# Patient Record
Sex: Female | Born: 1990 | Race: Black or African American | Hispanic: No | Marital: Single | Smoking: Never smoker
Health system: Southern US, Community
[De-identification: ages and names within clinical notes are randomized; demographics above are authoritative.]

## PROBLEM LIST (undated history)

## (undated) ENCOUNTER — Inpatient Hospital Stay (HOSPITAL_COMMUNITY): Payer: Self-pay

## (undated) DIAGNOSIS — R519 Headache, unspecified: Secondary | ICD-10-CM

## (undated) HISTORY — PX: NO PAST SURGERIES: SHX2092

---
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
## 2010-04-15 ENCOUNTER — Inpatient Hospital Stay (INDEPENDENT_AMBULATORY_CARE_PROVIDER_SITE_OTHER)
Admission: RE | Admit: 2010-04-15 | Discharge: 2010-04-15 | Disposition: A | Discharge status: Home / Self Care | Payer: PRIVATE HEALTH INSURANCE | Source: Ambulatory Visit | Attending: Family Medicine | Admitting: Family Medicine

## 2010-04-15 DIAGNOSIS — Z331 Pregnant state, incidental: Secondary | ICD-10-CM

## 2010-04-15 LAB — POCT URINALYSIS DIP (DEVICE)
Hgb urine dipstick: NEGATIVE
Ketones, ur: NEGATIVE mg/dL
Nitrite: NEGATIVE
Specific Gravity, Urine: 1.02 (ref 1.005–1.030)
Urobilinogen, UA: 0.2 mg/dL (ref 0.0–1.0)

## 2010-06-07 ENCOUNTER — Inpatient Hospital Stay (HOSPITAL_COMMUNITY): Payer: PRIVATE HEALTH INSURANCE

## 2010-06-07 ENCOUNTER — Inpatient Hospital Stay (HOSPITAL_COMMUNITY)
Admission: AD | Admit: 2010-06-07 | Discharge: 2010-06-08 | Disposition: A | Discharge status: Home / Self Care | Payer: PRIVATE HEALTH INSURANCE | Source: Ambulatory Visit | Attending: Obstetrics and Gynecology | Admitting: Obstetrics and Gynecology

## 2010-06-07 DIAGNOSIS — O039 Complete or unspecified spontaneous abortion without complication: Secondary | ICD-10-CM | POA: Insufficient documentation

## 2010-06-07 LAB — CBC
MCH: 27.9 pg (ref 26.0–34.0)
Platelets: 265 10*3/uL (ref 150–400)
RBC: 4.56 MIL/uL (ref 3.87–5.11)
RDW: 13.7 % (ref 11.5–15.5)

## 2010-06-07 LAB — HCG, QUANTITATIVE, PREGNANCY: hCG, Beta Chain, Quant, S: 785 m[IU]/mL — ABNORMAL HIGH (ref <5)

## 2010-06-07 IMAGING — US US OB TRANSVAGINAL
1 series · 14 of 28 positions shown · non-contrast
Comparison: None.

CLINICAL DATA: 20-year-old female with heavy vaginal bleeding.
Gestational age by LMP 12 weeks and 6 days.  Quantitative beta HCG
pending.

OBSTETRIC <14 WK ULTRASOUND
TECHNIQUE: Transabdominal ultrasound was performed for evaluation
of the gestation as well as the maternal uterus and adnexal
regions.

[Series 1: us ob comp less 14 wks · 14 of 41 slices shown]
[im 2/41]
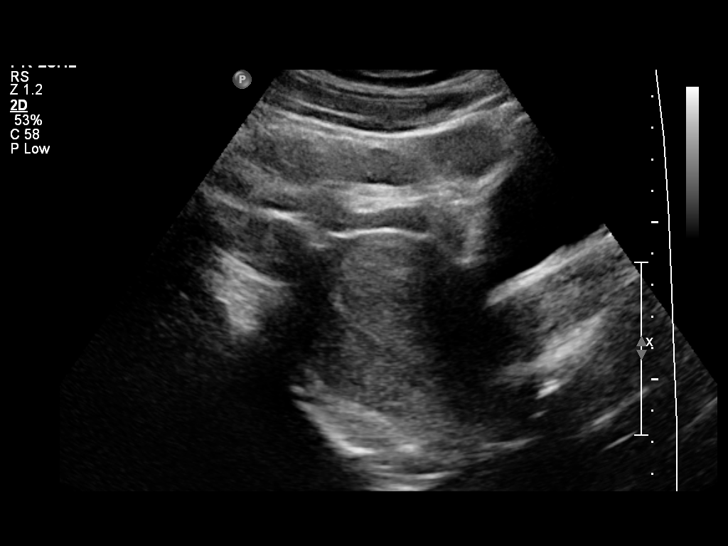
[im 5/41]
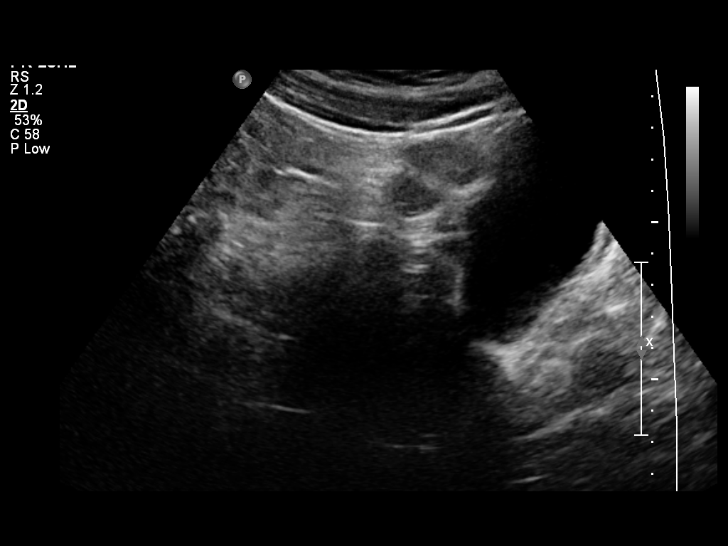
[im 8/41]
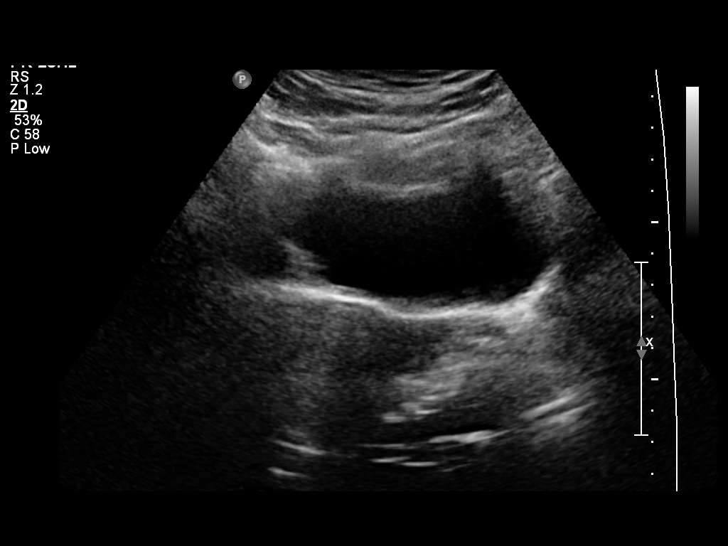
[im 11/41]
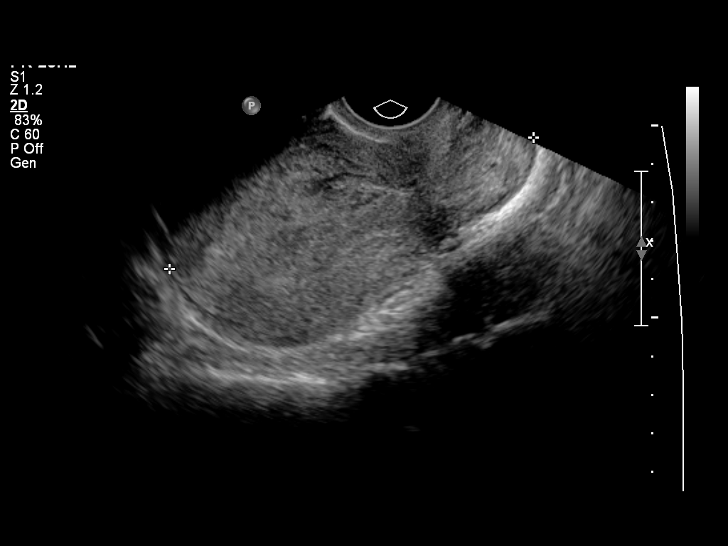
[im 14/41]
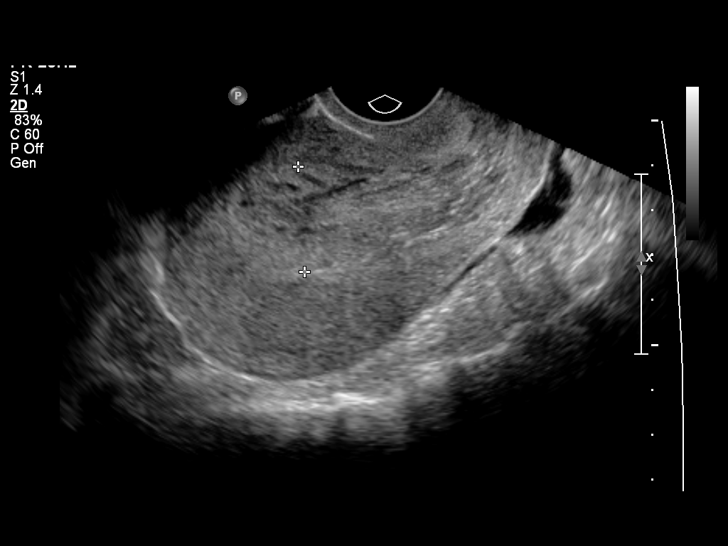
[im 17/41]
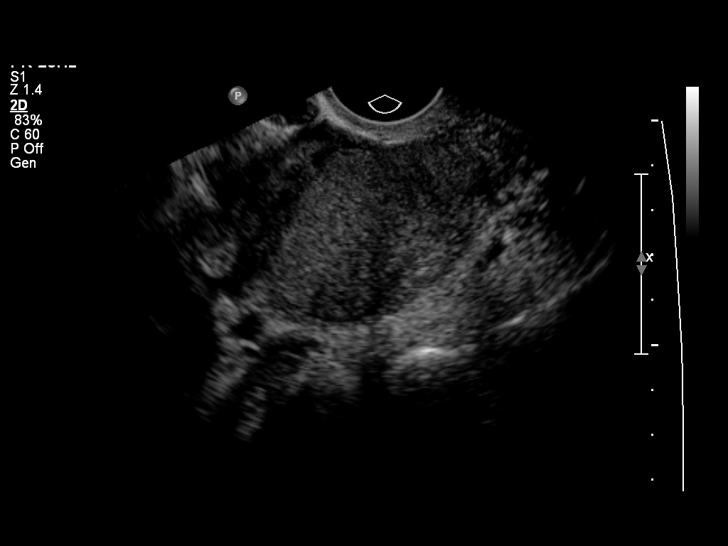
[im 20/41]
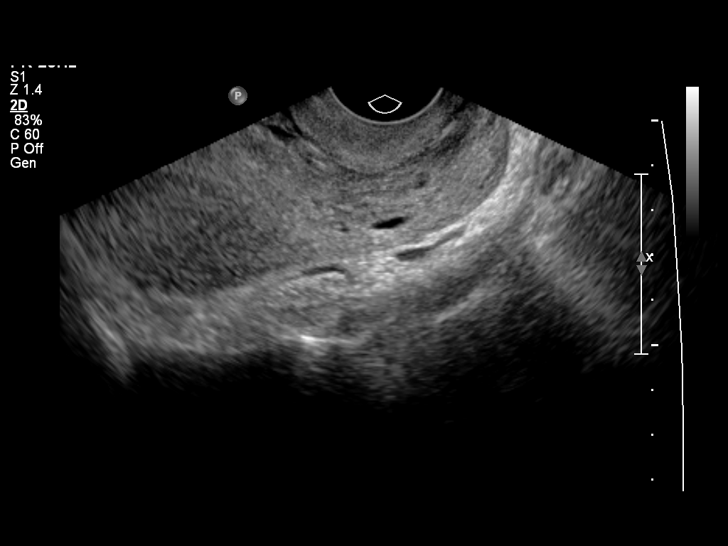
[im 23/41]
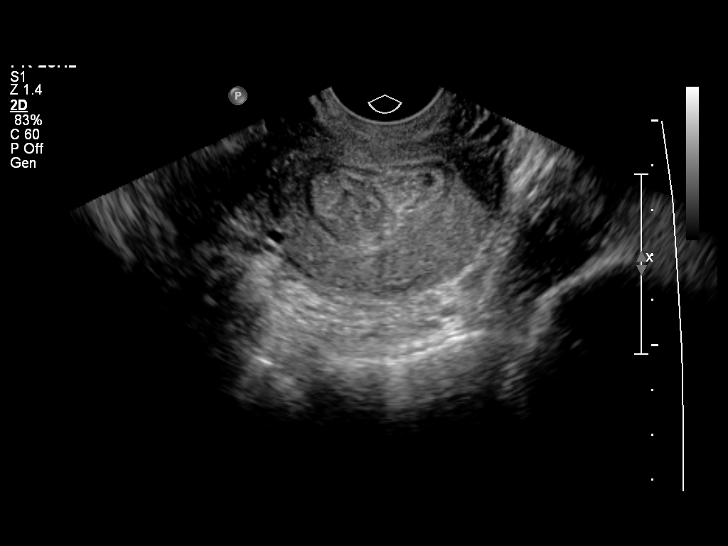
[im 26/41]
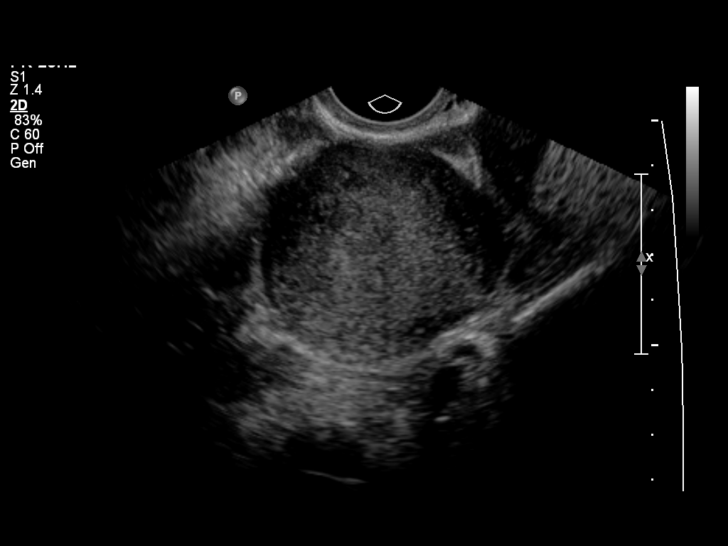
[im 29/41]
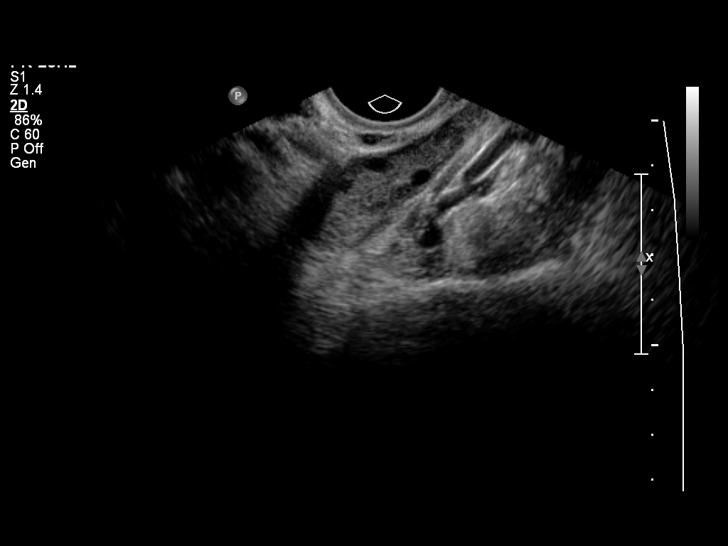
[im 32/41]
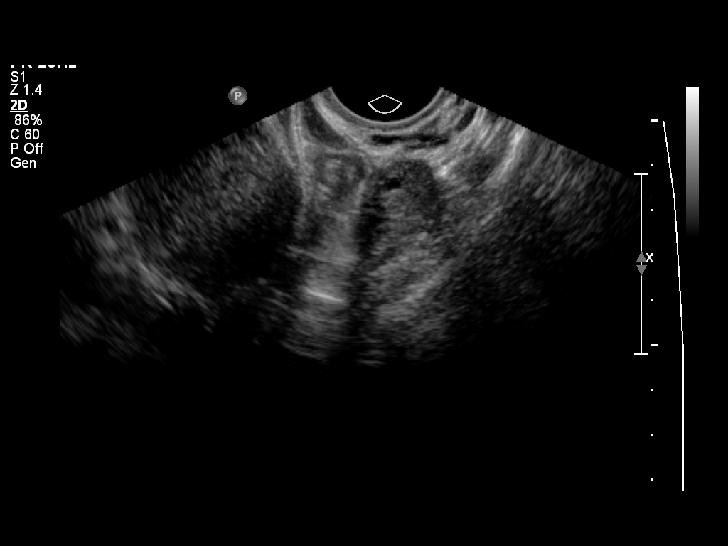
[im 35/41]
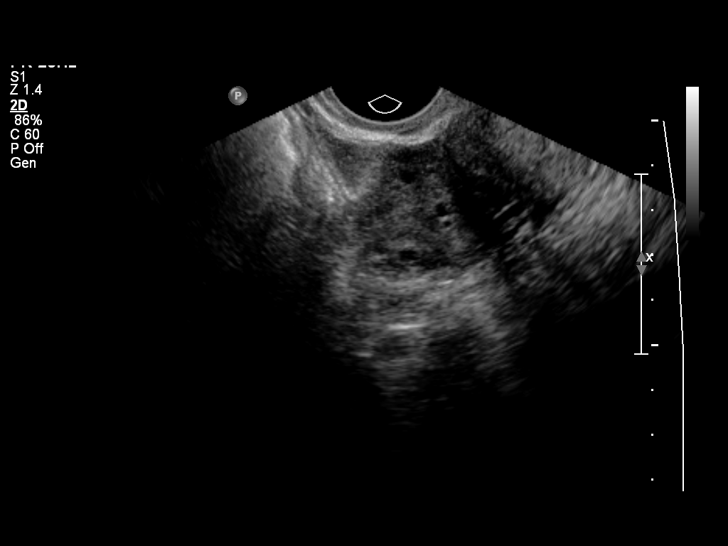
[im 38/41]
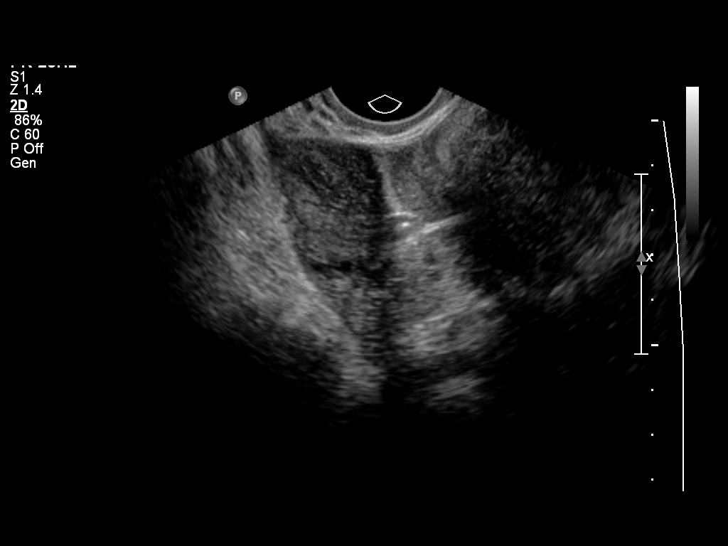
[im 41/41]
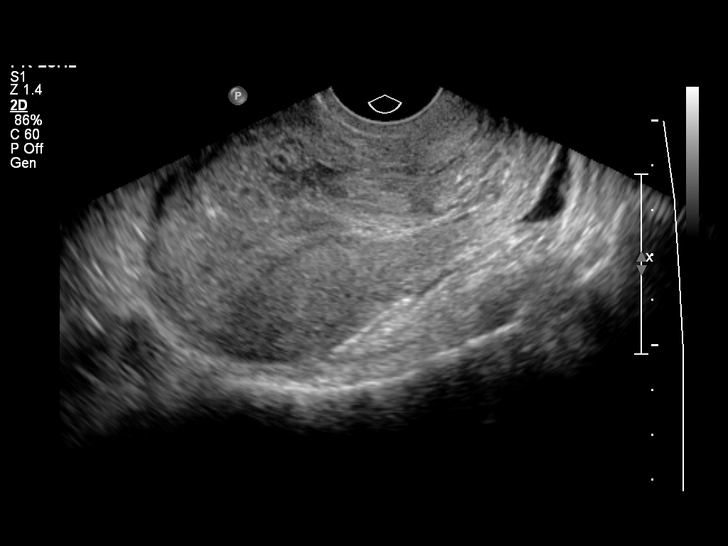

[14 of 28 positions shown; findings below may reference images not displayed]

Intrauterine gestational sac: None.

Maternal uterus/Adnexae:
Heterogeneous endometrial cavity, mostly hyperechogenic.
Endometrium measures up to 24 mm in thickness.  No pelvic free
fluid.
Both ovaries appear normal with small follicles.  The left measures
4.1 x 1.6 x 1.8 cm and the right measures 3.8 x 2.8 by 2.3 cm.
IMPRESSION: Heterogeneous endometrial cavity and no intrauterine gestational
sac.  Normal appearance of the adnexa and no pelvic free fluid.
Constellation of clinical and imaging findings favors spontaneous
abortion.

## 2010-06-08 ENCOUNTER — Ambulatory Visit (HOSPITAL_COMMUNITY)
Admission: EM | Admit: 2010-06-08 | Payer: PRIVATE HEALTH INSURANCE | Source: Ambulatory Visit | Admitting: Obstetrics and Gynecology

## 2010-07-11 NOTE — H&P (Signed)
  NAMEKENNEDY, Lisa Brown                  ACCOUNT NO.:  [id number]  MEDICAL RECORD NO.:  [id number]           PATIENT TYPE:  O  LOCATION:  [facility]                         FACILITY[facility]  [facility]  PHYSICIAN:  Anees Vanecek A. Andray Assefa, M.D. DATE OF BIRTH:  [birth year]  DATE OF ADMISSION:  06/07/2010 DATE OF DISCHARGE:  06/08/2010                             HISTORY & PHYSICAL   CHIEF COMPLAINT:  Missed AB at 8 weeks.  HISTORY OF PRESENT ILLNESS:  The patient is a 20 year old gravida 2, para 0 who presented on Jun 05, 2010 complaining of spotting and some cramping.  She had an ultrasound which showed a irregular shaped gestational sac, subchorionic hemorrhage and fetus without cardiac motion.  Both ovaries were normal.  The patient was given the options of observation versus Cytotec versus D and C, and she has decided to move with D and C.  PAST OBSTETRICAL HISTORY:  Significant for an elective abortion in 2005 in a prior pregnancy.  GYNECOLOGICAL HISTORY:  Significant for menarche at age 23 occurring every 28 days, lasting for 4-6 days.  No history of abnormal Pap or sexually transmitted diseases.  PAST MEDICAL HISTORY:  Significant for asthma.  FAMILY HISTORY:  Significant for varicosities and father with diabetes.  SOCIAL HISTORY:  The patient denies any tobacco use.  She had occasional alcohol use.  MEDICATIONS:  Prenatal vitamins.  ALLERGIES:  The patient is not allergic to any medicines.  PHYSICAL EXAMINATION:  VITAL SIGNS:  She weighs 240 pounds.  Blood pressure is 118/80. GENERAL:  She is in no apparent distress. HEART:  Regular rate and rhythm. LUNGS:  Clear to auscultation bilaterally. ABDOMEN:  Soft and nontender. GENITOURINARY:  Vaginal exam was done by the midwife.  There is some brownish discharge.  The cervix was closed.  Uterus about 6-week size.  ASSESSMENT:  Missed abortion at 8 weeks.  The patient desires a dilation and evacuation.  She understands the risks but not limited to  bleeding, infection, damage to internal organs such as bowel, bladder, and major blood vessels.  Her blood type is O+.     Lexxie Winberg A. Normand Sloop, M.D.     NAD/MEDQ  D:  06/07/2010  T:  06/08/2010  Job:  [id number]  Electronically Signed by Jaymes Graff M.D. on 07/11/2010 10:35:40 AM

## 2010-08-12 ENCOUNTER — Encounter (HOSPITAL_COMMUNITY): Payer: Self-pay

## 2010-08-12 ENCOUNTER — Inpatient Hospital Stay (HOSPITAL_COMMUNITY)
Admission: AD | Admit: 2010-08-12 | Discharge: 2010-08-12 | Disposition: A | Discharge status: Home / Self Care | Payer: PRIVATE HEALTH INSURANCE | Source: Ambulatory Visit | Attending: Obstetrics and Gynecology | Admitting: Obstetrics and Gynecology

## 2010-08-12 DIAGNOSIS — N949 Unspecified condition associated with female genital organs and menstrual cycle: Secondary | ICD-10-CM | POA: Insufficient documentation

## 2010-08-12 DIAGNOSIS — N920 Excessive and frequent menstruation with regular cycle: Secondary | ICD-10-CM | POA: Insufficient documentation

## 2010-08-12 LAB — URINALYSIS, ROUTINE W REFLEX MICROSCOPIC
Protein, ur: NEGATIVE mg/dL
Urobilinogen, UA: 0.2 mg/dL (ref 0.0–1.0)

## 2010-08-12 LAB — CBC
MCH: 28.8 pg (ref 26.0–34.0)
MCHC: 34.4 g/dL (ref 30.0–36.0)
MCV: 83.5 fL (ref 78.0–100.0)
Platelets: 257 10*3/uL (ref 150–400)
RBC: 4.73 MIL/uL (ref 3.87–5.11)
RDW: 13.4 % (ref 11.5–15.5)

## 2010-08-12 LAB — URINE MICROSCOPIC-ADD ON

## 2010-08-12 LAB — POCT PREGNANCY, URINE: Preg Test, Ur: NEGATIVE

## 2010-08-12 NOTE — Progress Notes (Signed)
Patient reports had miscarriage in April, has had unprotected intercourse since then, not using any form of birth control.

## 2010-08-12 NOTE — Progress Notes (Signed)
LMP 08/10/10 regular period, today had heavy vaginal bleeding went through 3 pads in an hour, bleeding has decreased some, having lower abdominal cramping

## 2010-08-12 NOTE — ED Provider Notes (Signed)
History    20 y/o AA female presents to MAU w CC heavy and crampy menses. Started yesterday and flow became heavy today, states soaked through a pad an hour x 3 hours, now less with less cramping. Currently on no contraceptive method and does not desire pregnancy @ this time. Had SAB with unintended pregnancy approx 3 mos ago. Has had reg menstrual bleeding since. Previous use of OCPs without problems but had difficulty with remembering to take pill every day and had BTB. Denies VTE (fam. Or personal) hx. Non-smoker. Denies migraine hx.  Chief Complaint  Patient presents with  . Vaginal Bleeding  . Abdominal Pain   Vaginal Bleeding The patient's primary symptoms include pelvic pain (menstrual cramping) and vaginal bleeding. This is a new problem. The current episode started yesterday. The problem occurs constantly. The problem has been rapidly improving. The pain is mild. The problem affects both sides. She is not pregnant. Pertinent negatives include no abdominal pain, anorexia, back pain, chills, constipation, diarrhea, dysuria, fever, flank pain, frequency, headaches, hematuria, joint swelling, nausea, painful intercourse, rash, sore throat or urgency. The symptoms are aggravated by activity. She has tried nothing for the symptoms. She is sexually active. No, her partner does not have an STD. She uses nothing for contraception. Her menstrual history has been regular. Her past medical history is significant for miscarriage and vaginosis. There is no history of an abdominal surgery, a Cesarean section, an ectopic pregnancy, endometriosis, a gynecological surgery, herpes simplex, menorrhagia, metrorrhagia, ovarian cysts, perineal abscess, PID, an STD or a terminated pregnancy.  Abdominal Pain Pertinent negatives include no anorexia, constipation, diarrhea, dysuria, fever, frequency, headaches, hematuria or nausea. There is no history of abdominal surgery.    Past Medical History  Diagnosis Date  . No  pertinent past medical history     Past Surgical History  Procedure Date  . No past surgeries     No family history on file.  History  Substance Use Topics  . Smoking status: Not on file  . Smokeless tobacco: Never Used  . Alcohol Use:     OB History    Grav Para Term Preterm Abortions TAB SAB Ect Mult Living   2    2  2          Review of Systems  Constitutional: Negative.  Negative for fever and chills.  HENT: Negative.  Negative for sore throat.   Eyes: Negative.   Respiratory: Negative.   Cardiovascular: Negative.   Gastrointestinal: Negative.  Negative for nausea, abdominal pain, diarrhea, constipation and anorexia.  Genitourinary: Positive for vaginal bleeding, menstrual problem and pelvic pain (menstrual cramping). Negative for dysuria, urgency, frequency, hematuria, flank pain, enuresis, difficulty urinating, genital sores, dyspareunia and menorrhagia.  Musculoskeletal: Negative.  Negative for back pain.  Skin: Negative for rash.  Neurological: Negative.  Negative for headaches.  Hematological: Negative.   Psychiatric/Behavioral: Negative.     Physical Exam  BP 112/67  Pulse 75  Temp(Src) 98.8 F (37.1 C) (Oral)  Resp 16  Ht 5\' 10"  (1.778 m)  Wt 108.863 kg (240 lb)  BMI 34.44 kg/m2  LMP 08/10/2010  Physical Exam  Constitutional: She is oriented to person, place, and time. She appears well-developed and well-nourished.  HENT:  Head: Normocephalic.  Cardiovascular: Normal rate.   Pulmonary/Chest: Effort normal.  Abdominal: Soft.  Genitourinary: Uterus normal. Vaginal discharge (mod flow menstrual blood) found.  Musculoskeletal: Normal range of motion.  Neurological: She is alert and oriented to person, place, and time. She  has normal reflexes.  Skin: Skin is warm and dry. No rash noted. No erythema. No pallor.  Psychiatric: She has a normal mood and affect. Her behavior is normal. Judgment and thought content normal.   Spec. Exam: mod menstrual blood  present, os closed without lesions. Pt states last Pap Jan 2012 and normal  Lab Results  Component Value Date   WBC 5.8 08/12/2010   HGB 13.6 08/12/2010   HCT 39.5 08/12/2010   MCV 83.5 08/12/2010   PLT 257 08/12/2010   Beta HCG Quant: neg ED Course  Procedures None  A: Menorrhagia P: 1. Ibuprofen 800 mg q 8 hours first dose with onset of Menstrual cramping/bleeding x 3 days 2. Nuvaring, insert tomorrow 3. ACHES precautions  4. F/U 3 mos for BP/Contraceptive method check in office

## 2010-09-01 ENCOUNTER — Inpatient Hospital Stay (INDEPENDENT_AMBULATORY_CARE_PROVIDER_SITE_OTHER)
Admission: RE | Admit: 2010-09-01 | Discharge: 2010-09-01 | Disposition: A | Discharge status: Home / Self Care | Payer: PRIVATE HEALTH INSURANCE | Source: Ambulatory Visit | Attending: Family Medicine | Admitting: Family Medicine

## 2010-09-01 DIAGNOSIS — N12 Tubulo-interstitial nephritis, not specified as acute or chronic: Secondary | ICD-10-CM

## 2010-09-01 DIAGNOSIS — K5900 Constipation, unspecified: Secondary | ICD-10-CM

## 2010-09-01 LAB — POCT URINALYSIS DIP (DEVICE)
Bilirubin Urine: NEGATIVE
Glucose, UA: NEGATIVE mg/dL
Ketones, ur: 15 mg/dL — AB
Specific Gravity, Urine: <=1.005 (ref 1.005–1.030)

## 2010-09-01 LAB — POCT PREGNANCY, URINE: Preg Test, Ur: NEGATIVE

## 2011-01-17 ENCOUNTER — Inpatient Hospital Stay (HOSPITAL_COMMUNITY): Payer: PRIVATE HEALTH INSURANCE

## 2011-01-17 ENCOUNTER — Inpatient Hospital Stay (HOSPITAL_COMMUNITY)
Admission: AD | Admit: 2011-01-17 | Discharge: 2011-01-17 | Disposition: A | Discharge status: Home / Self Care | Payer: PRIVATE HEALTH INSURANCE | Source: Ambulatory Visit | Attending: Obstetrics & Gynecology | Admitting: Obstetrics & Gynecology

## 2011-01-17 ENCOUNTER — Encounter (HOSPITAL_COMMUNITY): Payer: Self-pay | Admitting: *Deleted

## 2011-01-17 DIAGNOSIS — A499 Bacterial infection, unspecified: Secondary | ICD-10-CM | POA: Insufficient documentation

## 2011-01-17 DIAGNOSIS — N760 Acute vaginitis: Secondary | ICD-10-CM | POA: Insufficient documentation

## 2011-01-17 DIAGNOSIS — O2390 Unspecified genitourinary tract infection in pregnancy, unspecified trimester: Secondary | ICD-10-CM | POA: Insufficient documentation

## 2011-01-17 DIAGNOSIS — B9689 Other specified bacterial agents as the cause of diseases classified elsewhere: Secondary | ICD-10-CM | POA: Insufficient documentation

## 2011-01-17 DIAGNOSIS — O209 Hemorrhage in early pregnancy, unspecified: Secondary | ICD-10-CM | POA: Insufficient documentation

## 2011-01-17 LAB — CBC
MCH: 28.5 pg (ref 26.0–34.0)
MCV: 82.5 fL (ref 78.0–100.0)
Platelets: 323 10*3/uL (ref 150–400)
RBC: 4.73 MIL/uL (ref 3.87–5.11)

## 2011-01-17 LAB — WET PREP, GENITAL: Trich, Wet Prep: NONE SEEN

## 2011-01-17 IMAGING — US US OB TRANSVAGINAL
1 series · 14 of 28 positions shown · non-contrast
Comparison: Previous obstetric ultrasound dated [DATE].

CLINICAL DATA: Vaginal bleeding and pelvic pain. Early pregnancy
with estimated gestational age by last menstrual period of 6 weeks
and 2 days.

OBSTETRIC <14 WK US AND TRANSVAGINAL OB US
TECHNIQUE: Both transabdominal and transvaginal ultrasound
examinations were performed for complete evaluation of the
gestation as well as the maternal uterus, adnexal regions, and
pelvic cul-de-sac.  Transvaginal technique was performed to assess
early pregnancy.

[Series 1: us ob comp less 14 wks · 31 acquisitions, 14 frames shown]
[im 2/31]
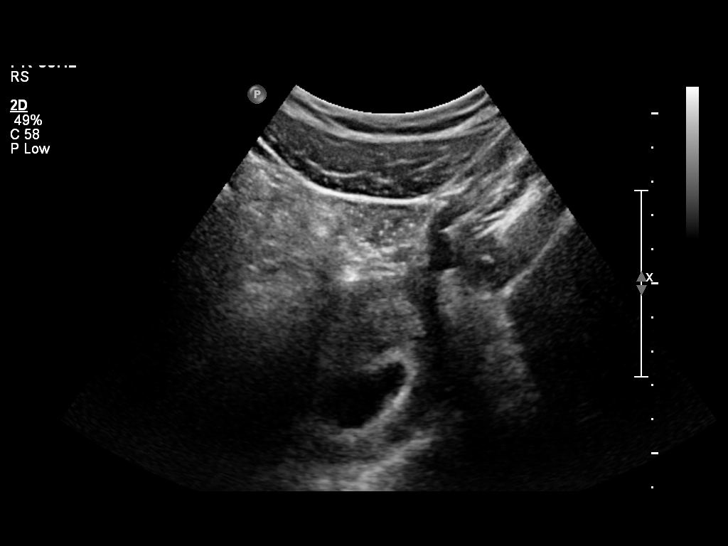
[im 4/31]
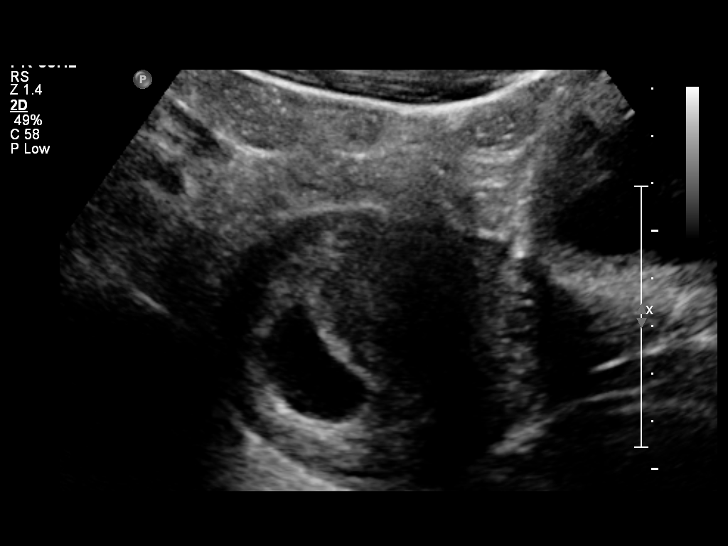
[im 6/31]
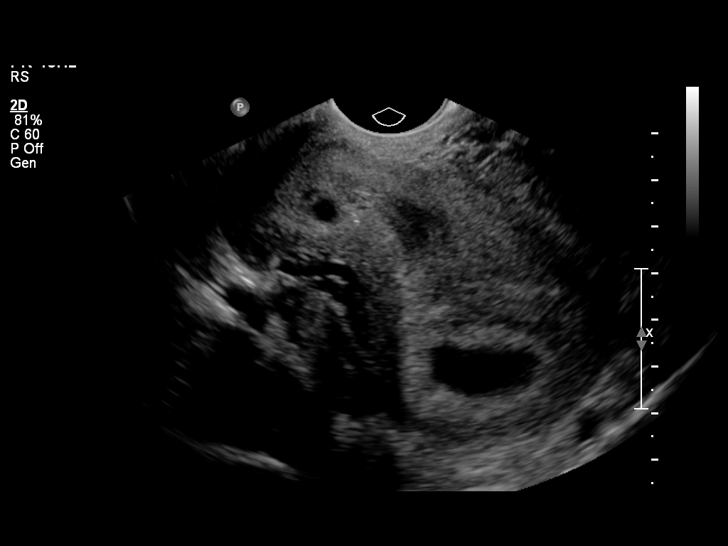
[im 8/31]
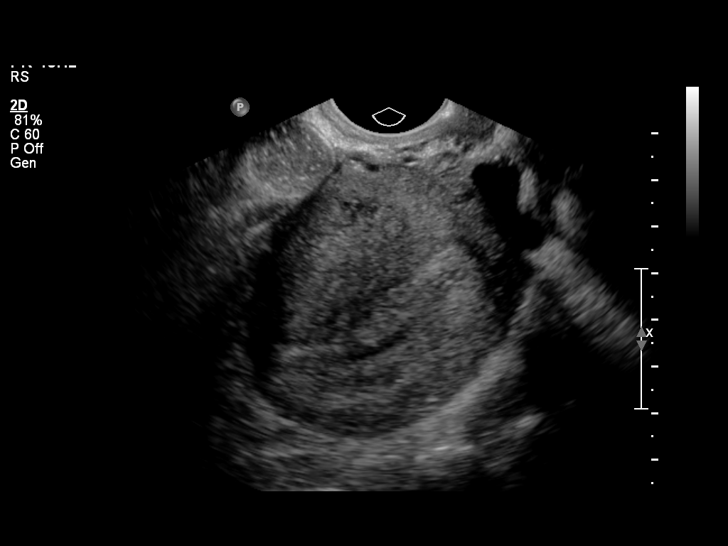
[im 11/31]
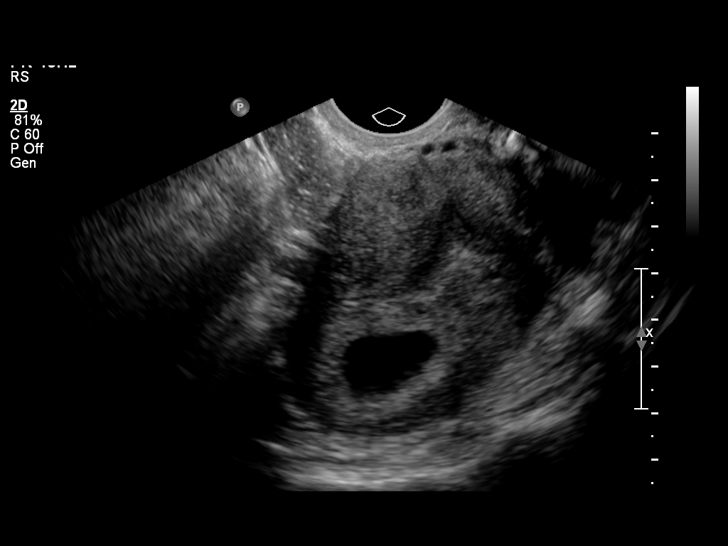
[im 13/31]
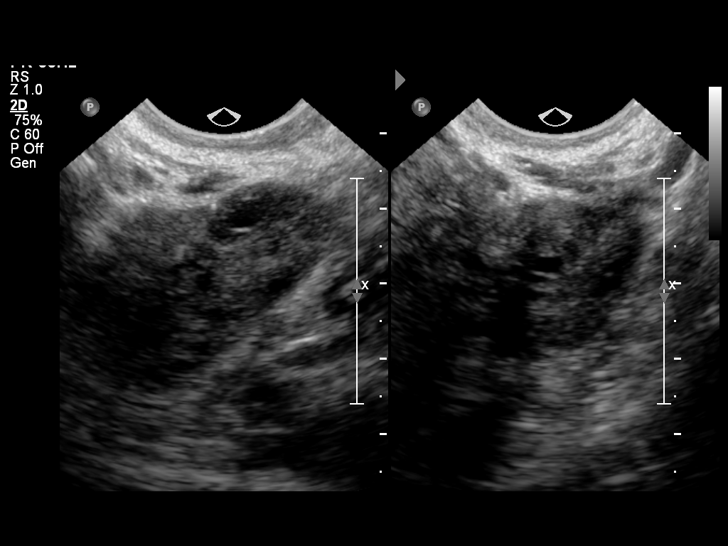
[im 15/31]
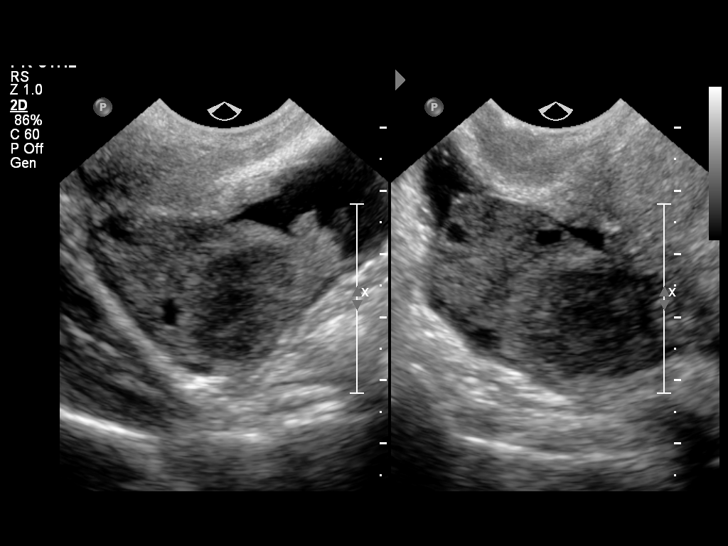
[im 17/31]
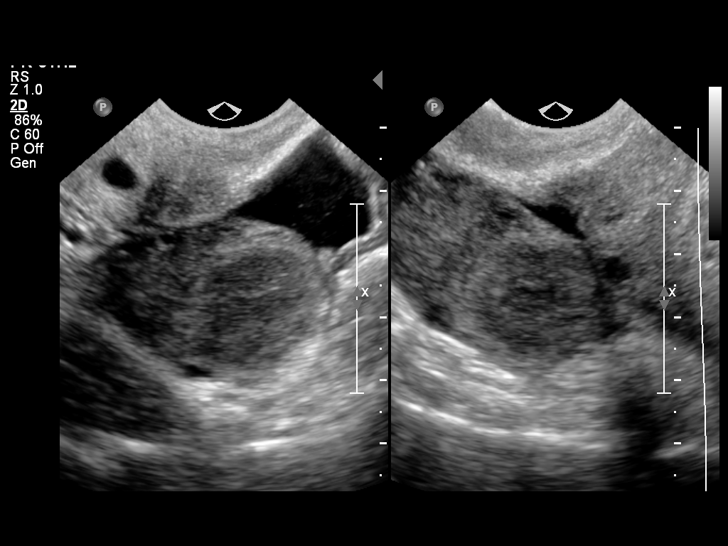
[im 19/31]
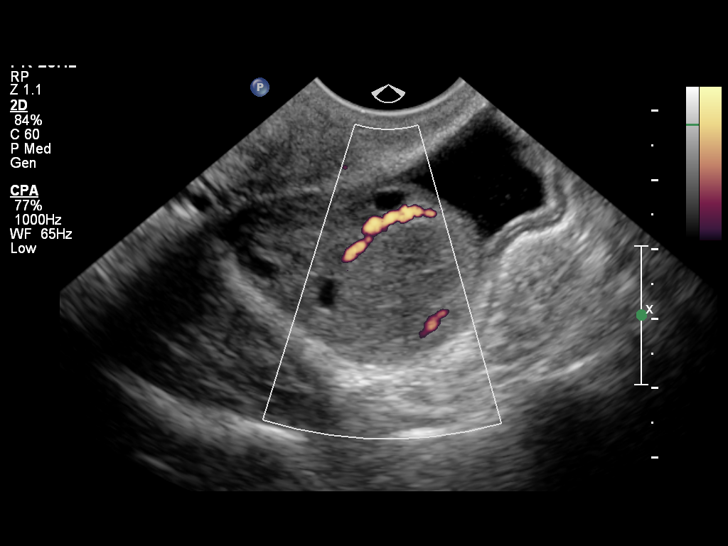
[im 22/31]
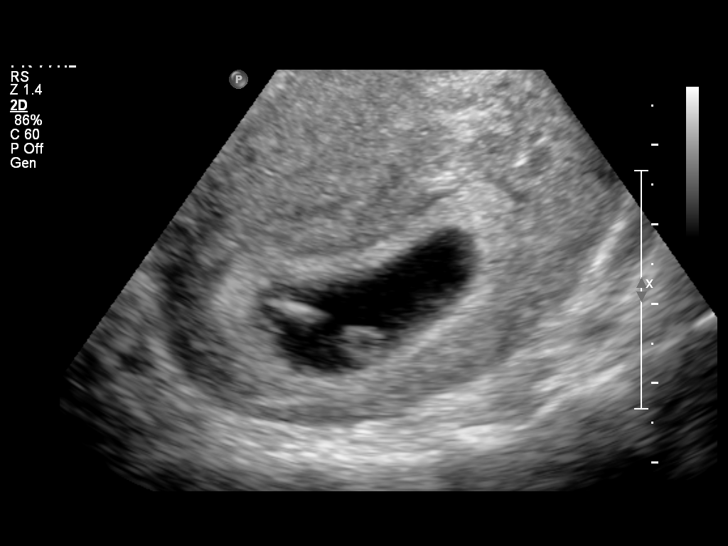
[im 24/31]
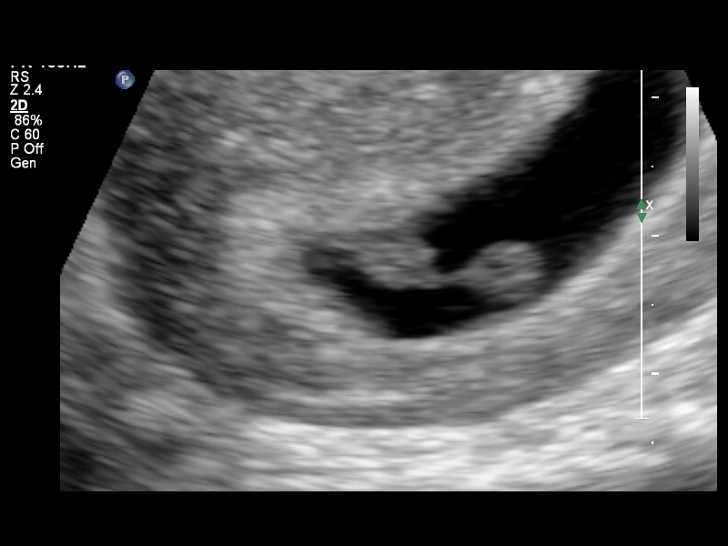
[im 26/31]
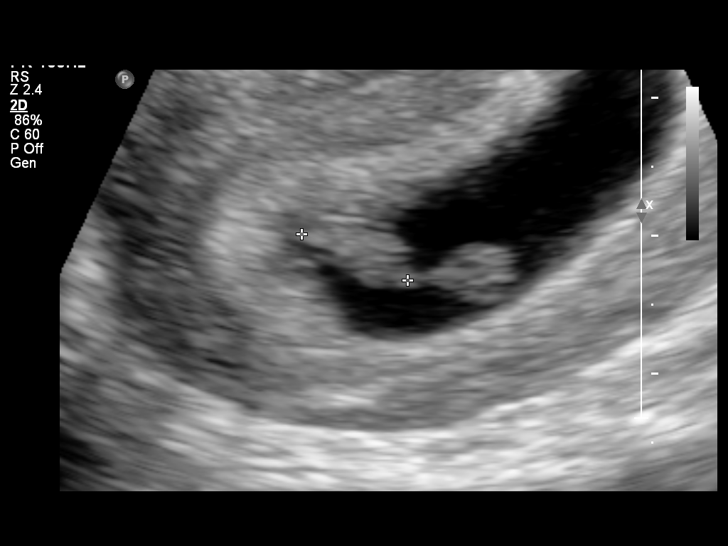
[im 28/31]
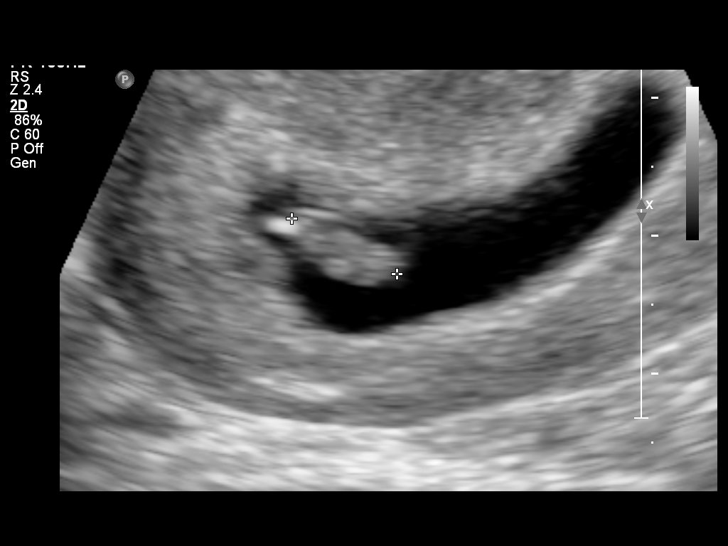
[im 31/31]
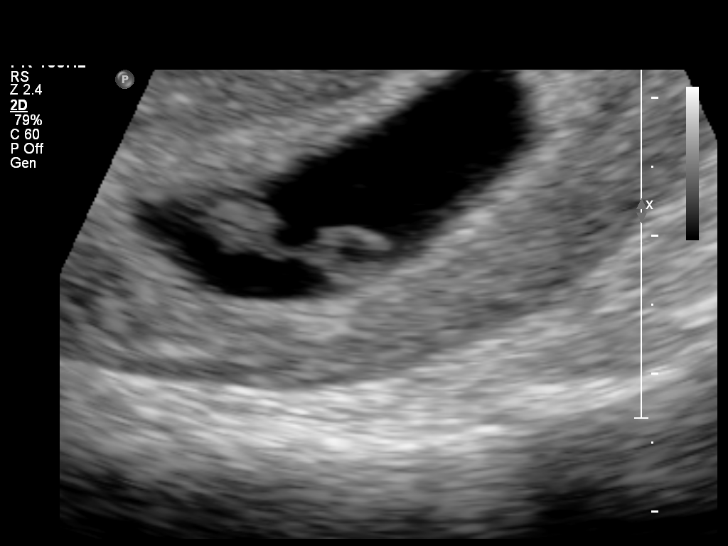

[14 of 28 positions shown; findings below may reference images not displayed]

Intrauterine gestational sac:  Single intrauterine gestational sac
identified.
Yolk sac: Single round yolk sac identified.
Embryo: Single embryo identified.
Cardiac Activity: Regular cardiac activity identified.
Heart Rate: 124 bpm
CRL: 8.7 mm   mm  6   w  6   d            US EDC: [DATE]

Maternal uterus/adnexae:
Normal bilateral ovaries with a 2 cm corpus luteal cyst noted on
the right.  No adnexal masses.  No evidence of subchorionic
hemorrhage.  Small amount of free fluid present adjacent to the
right ovary.
IMPRESSION: Viable intrauterine gestation as above with estimated gestational
age by ultrasound of 6 weeks and 6 days.  No complication evident.

## 2011-01-17 MED ORDER — METRONIDAZOLE 500 MG PO TABS: 500.0000 mg | ORAL_TABLET | Freq: Two times a day (BID) | ORAL | Status: AC

## 2011-01-17 NOTE — Progress Notes (Signed)
Patient states she has had spotting for about 4 days. Has pain on and off, but none now.

## 2011-01-17 NOTE — ED Provider Notes (Signed)
History     Chief Complaint  Patient presents with  . Vaginal Bleeding   HPI  Pt is here with report of spotting of blood x 4 days.  Denies clots.  Also reports midpelvic pain, with no radiation to one side.  Denies abnormal vaginal discharge or odor.    Past Medical History  Diagnosis Date  . Asthma     Past Surgical History  Procedure Date  . No past surgeries     No family history on file.  History  Substance Use Topics  . Smoking status: Never Smoker   . Smokeless tobacco: Never Used  . Alcohol Use: No    Allergies: No Known Allergies  Prescriptions prior to admission  Medication Sig Dispense Refill  . ibuprofen (ADVIL,MOTRIN) 800 MG tablet Take 800 mg by mouth every 8 (eight) hours as needed. For pain, head cold      . Multiple Vitamin (MULTIVITAMIN PO) Take 1 tablet by mouth daily.        Marland Kitchen albuterol (PROVENTIL HFA;VENTOLIN HFA) 108 (90 BASE) MCG/ACT inhaler Inhale 2 puffs into the lungs every 6 (six) hours as needed. For shortness of breath       . Calcium Carbonate-Vitamin D (CALCIUM + D PO) Take 1 tablet by mouth daily.         Review of Systems  Gastrointestinal: Positive for abdominal pain.  Genitourinary:       Vaginal bleeding  All other systems reviewed and are negative.   Physical Exam   Blood pressure 118/62, pulse 74, temperature 98.4 F (36.9 C), temperature source Oral, resp. rate 20, height 5\' 10"  (1.778 m), weight 105.779 kg (233 lb 3.2 oz), last menstrual period 12/04/2010, SpO2 98.00%.  Physical Exam  Constitutional: She is oriented to person, place, and time. She appears well-developed and well-nourished. No distress.  HENT:  Head: Normocephalic.  Neck: Normal range of motion. Neck supple.  Cardiovascular: Normal rate, regular rhythm and normal heart sounds.  Exam reveals no gallop and no friction rub.   No murmur heard. Respiratory: Effort normal and breath sounds normal. No respiratory distress.  GI: She exhibits no mass. There is  no tenderness. There is no rebound, no guarding and no CVA tenderness.  Genitourinary: Uterus is enlarged. Cervix exhibits no motion tenderness and no discharge. Vaginal discharge (white, creamy) found.  Musculoskeletal: Normal range of motion.  Neurological: She is alert and oriented to person, place, and time.  Skin: Skin is warm and dry.  Psychiatric: She has a normal mood and affect.    MAU Course  Procedures  Results for orders placed during the hospital encounter of 01/17/11 (from the past 24 hour(s))  WET PREP, GENITAL     Status: Abnormal   Collection Time   01/17/11  4:15 PM      Component Value Range   Yeast, Wet Prep NONE SEEN  NONE SEEN    Trich, Wet Prep NONE SEEN  NONE SEEN    Clue Cells, Wet Prep MODERATE (*) NONE SEEN    WBC, Wet Prep HPF POC FEW (*) NONE SEEN   CBC     Status: Normal   Collection Time   01/17/11  4:24 PM      Component Value Range   WBC 10.5  4.0 - 10.5 (K/uL)   RBC 4.73  3.87 - 5.11 (MIL/uL)   Hemoglobin 13.5  12.0 - 15.0 (g/dL)   HCT 16.1  09.6 - 04.5 (%)   MCV 82.5  78.0 -  100.0 (fL)   MCH 28.5  26.0 - 34.0 (pg)   MCHC 34.6  30.0 - 36.0 (g/dL)   RDW 16.1  09.6 - 04.5 (%)   Platelets 323  150 - 400 (K/uL)  HCG, QUANTITATIVE, PREGNANCY     Status: Abnormal   Collection Time   01/17/11  4:24 PM      Component Value Range   hCG, Beta Chain, Quant, S 53459 (*) <5 (mIU/mL)  ABO/RH     Status: Normal   Collection Time   01/17/11  4:24 PM      Component Value Range   ABO/RH(D) O POS     IMPRESSION:  Viable intrauterine gestation as above with estimated gestational  age by ultrasound of 6 weeks and 6 days. No complication evident.   Assessment and Plan  Intrauterine Pregnancy Bacterial Vaginosis Bleeding in early pregnancy  Plan: DC to home RX Flagyl Begin prenatal care.   [facility] 01/17/2011, 4:05 PM

## 2011-01-18 NOTE — ED Provider Notes (Signed)
Attestation of Attending Supervision of Advanced Practitioner: Evaluation and management procedures were performed by the PA/NP/CNM/OB Fellow under my supervision/collaboration. Chart reviewed, and agree with management and plan.  Cherice Glennie, M.D. 01/18/2011 3:18 PM   

## 2011-01-19 LAB — GC/CHLAMYDIA PROBE AMP, GENITAL: Chlamydia, DNA Probe: NEGATIVE

## 2011-02-19 ENCOUNTER — Inpatient Hospital Stay (HOSPITAL_COMMUNITY)
Admission: AD | Admit: 2011-02-19 | Discharge: 2011-02-19 | Disposition: A | Discharge status: Home / Self Care | Payer: Medicaid Other | Source: Ambulatory Visit | Attending: Obstetrics and Gynecology | Admitting: Obstetrics and Gynecology

## 2011-02-19 ENCOUNTER — Encounter (HOSPITAL_COMMUNITY): Payer: Self-pay | Admitting: Obstetrics and Gynecology

## 2011-02-19 DIAGNOSIS — R109 Unspecified abdominal pain: Secondary | ICD-10-CM | POA: Insufficient documentation

## 2011-02-19 DIAGNOSIS — O26859 Spotting complicating pregnancy, unspecified trimester: Secondary | ICD-10-CM | POA: Insufficient documentation

## 2011-02-19 MED ORDER — IBUPROFEN 800 MG PO TABS
800.0000 mg | ORAL_TABLET | Freq: Once | ORAL | Status: AC
  Administered 2011-02-19: 800 mg via ORAL
  Filled 2011-02-19: qty 1

## 2011-02-19 NOTE — Progress Notes (Signed)
Pt presents to MAU with chief complaint of vaginal bleeding and abdominal cramping. Pt is [redacted]w[redacted]d; confirmed by [place] last week. Spotting and cramping started 6 days ago; brown, then bright red. Pts Dr. Is aware, felt like it has been worse today.

## 2011-02-19 NOTE — ED Provider Notes (Signed)
History  Reports vaginal spotting and cramping  Chief Complaint  Patient presents with  . Vaginal Bleeding   HPI 20yo G3P0 at 35 6/7wks known to me who was seen in the office on Friday for vaginal spotting.  Today she c/o cramping and some bleeding prior to coming in this evening.    OB History    Grav Para Term Preterm Abortions TAB SAB Ect Mult Living   3 0 0 0 2 1 1  0  0      Past Medical History  Diagnosis Date  . Asthma     Past Surgical History  Procedure Date  . No past surgeries     Family History  Problem Relation Age of Onset  . Diabetes Mother   . Diabetes Father   . Kidney disease Father   . Hypertension Father   . Liver disease Father     History  Substance Use Topics  . Smoking status: Never Smoker   . Smokeless tobacco: Never Used  . Alcohol Use: No    Allergies: No Known Allergies  Prescriptions prior to admission  Medication Sig Dispense Refill  . Prenatal Vit-Fe Fumarate-FA (PRENATAL MULTIVITAMIN) TABS Take 1 tablet by mouth daily.      Marland Kitchen albuterol (PROVENTIL HFA;VENTOLIN HFA) 108 (90 BASE) MCG/ACT inhaler Inhale 2 puffs into the lungs every 6 (six) hours as needed. For shortness of breath         ROS Denies n/v/f/c  Physical Exam   Blood pressure 118/67, pulse 79, temperature 98.1 F (36.7 C), temperature source Oral, resp. rate 16, height 5' 10.5" (1.791 m), weight 105.348 kg (232 lb 4 oz), last menstrual period 12/04/2010. FHT 166  Physical Exam Lungs CTA bil CV RRR Abd soft, NT Ext no calf tenderness Spec Exam no active bleeding VE c/l/p  MAU Course  Procedures FHT  Assessment and Plan  20yo P0 at 11 6/7wks with threatened sab.  Will give motrin for symptomatic relief.  Precautions reviewed with patient.  Purcell Nails 02/19/2011, 6:21 PM

## 2011-02-19 NOTE — Progress Notes (Signed)
Pt states prior miscarriage at 8wks, spotting now however has seen dark then red blood today. U/S last week, wnl. Having lower abd pains, like mild period cramping. Last intercourse last Wednesday. Denies abnormal vaginal d/c changes.

## 2011-07-15 ENCOUNTER — Emergency Department (HOSPITAL_COMMUNITY)
Admission: EM | Admit: 2011-07-15 | Discharge: 2011-07-15 | Disposition: A | Discharge status: Home / Self Care | Payer: PRIVATE HEALTH INSURANCE | Attending: Emergency Medicine | Admitting: Emergency Medicine

## 2011-07-15 ENCOUNTER — Encounter (HOSPITAL_COMMUNITY): Payer: Self-pay | Admitting: Emergency Medicine

## 2011-07-15 DIAGNOSIS — J45909 Unspecified asthma, uncomplicated: Secondary | ICD-10-CM | POA: Insufficient documentation

## 2011-07-15 DIAGNOSIS — B86 Scabies: Secondary | ICD-10-CM | POA: Insufficient documentation

## 2011-07-15 DIAGNOSIS — R21 Rash and other nonspecific skin eruption: Secondary | ICD-10-CM | POA: Insufficient documentation

## 2011-07-15 MED ORDER — PREDNISONE 20 MG PO TABS
60.0000 mg | ORAL_TABLET | Freq: Once | ORAL | Status: AC
  Administered 2011-07-15: 60 mg via ORAL
  Filled 2011-07-15: qty 3

## 2011-07-15 MED ORDER — PERMETHRIN 5 % EX CREA: TOPICAL_CREAM | CUTANEOUS | Status: AC

## 2011-07-15 NOTE — Discharge Instructions (Signed)
You were seen and evaluated for your symptoms of rash. At this time your providers were concerned for the possibility that your rash is caused from scabies. You have been given a prescription for a cream to use to help with this possibility. These use as instructed by applying from your neck to your feet and leaning on for 14 hours then washing off. You should wash all clothing and bed linens in hot water and vacuum carpets and furniture well.  Please followup with the primary care provider or dermatology specialist for continued evaluation if your symptoms continue.   Scabies Scabies are small bugs (mites) that burrow under the skin and cause red bumps and severe itching. These bugs can only be seen with a microscope. Scabies are highly contagious. They can spread easily from person to person by direct contact. They are also spread through sharing clothing or linens that have the scabies mites living in them. It is not unusual for an entire family to become infected through shared towels, clothing, or bedding.  HOME CARE INSTRUCTIONS   Your caregiver may prescribe a cream or lotion to kill the mites. If this cream is prescribed; massage the cream into the entire area of the body from the neck to the bottom of both feet. Also massage the cream into the scalp and face if your child is less than 3 year old. Avoid the eyes and mouth.   Leave the cream on for 8 to12 hours. Do not wash your hands after application. Your child should bathe or shower after the 8 to 12 hour application period. Sometimes it is helpful to apply the cream to your child at right before bedtime.   One treatment is usually effective and will eliminate approximately 95% of infestations. For severe cases, your caregiver may decide to repeat the treatment in 1 week. Everyone in your household should be treated with one application of the cream.   New rashes or burrows should not appear after successful treatment within 24 to 48 hours;  however the itching and rash may last for 2 to 4 weeks after successful treatment. If your symptoms persist longer than this, see your caregiver.   Your caregiver also may prescribe a medication to help with the itching or to help the rash go away more quickly.   Scabies can live on clothing or linens for up to 3 days. Your entire child's recently used clothing, towels, stuffed toys, and bed linens should be washed in hot water and then dried in a dryer for at least 20 minutes on high heat. Items that cannot be washed should be enclosed in a plastic bag for at least 3 days.   To help relieve itching, bathe your child in a cool bath or apply cool washcloths to the affected areas.   Your child may return to school after treatment with the prescribed cream.  SEEK MEDICAL CARE IF:   The itching persists longer than 4 weeks after treatment.   The rash spreads or becomes infected (the area has red blisters or yellow-tan crust).  Document Released: 01/07/2005 Document Revised: 12/27/2010 Document Reviewed: 05/18/2008 [facility] Patient Information 2012 [place], [place].    RESOURCE GUIDE  Chronic Pain Problems: Contact [facility]  [phone] Patients need to be referred by their primary care doctor.  Insufficient Money for Medicine: Contact United Way:  call "211" or [facility] 813-039-1935.  No Primary Care Doctor: - Call Health Connect  [phone] - can help you locate  a primary care doctor that  accepts your insurance, provides certain services, etc. - Physician Referral Service- [phone]  Agencies that provide inexpensive medical care: - [facility]  [phone] - [facility]  [phone] - [facility]  [phone] - Women's Clinic  [phone] - Planned Parenthood  [phone] [facility]  [phone]  Medicaid-accepting [facility] Providers: - [facility]- [address],  [address]  [phone], Mon-Fri 9am-7pm, Sat 9am-1pm - [facility]- [address] [place], [address] [place]  [phone] - [facility]- [address], [address] [place]  [phone] [facility]- [address]  [phone] - Renaye Rakers- [address] [place], [address], [phone]  Only accepts [place] patients after they have their name  applied to their card  Self Pay (no insurance) in [place]: - Sickle Cell Patients: Dr Willey Blade, [facility]  [address] [place], [phone] - [facility]- [address] [place]  [phone]       [facility]- [address]       -     [facility]- see information above (Speak to [employer] if you do not have insurance)       -  [facility]- [address] [place], [phone]       -  [facility]- [address],  [phone]       -  [facility]- [address], [phone]       -  Dr Julio Sicks-  [address], [place], [phone]       -  [facility]- [address], [phone]       -  [facility]- [address] [phone], also [address], [phone]       -    [facility]- [address] [place], [phone], 1st & 3rd Saturday   every month, 10am-1pm  1) Find a Doctor and Pay Out of Pocket Although you won't have to find out who is covered by your insurance plan, it is a good idea to ask around and get recommendations. You will then need to call the office and see if the doctor you have chosen will accept you as a new patient and what types of options they offer for patients who are self-pay. Some doctors offer discounts or will set up payment plans for their patients who do not have insurance, but you will need to ask so you aren't surprised when you get to your appointment.  2) Contact Your Local Health Department Not all health departments  have doctors that can see patients for sick visits, but many do, so it is worth a call to see if yours does. If you don't know where your local health department is, you can check in your phone book. The CDC also has a tool to help you locate your state's health department, and many state websites also have listings of all of their local health departments.  3) Find a Walk-in Clinic If your illness is not likely to be very severe or complicated, you may want to try a walk in clinic. These are popping up all over the country in pharmacies, drugstores, and shopping centers. They're usually staffed by nurse practitioners  or physician assistants that have been trained to treat common illnesses and complaints. They're usually fairly quick and inexpensive. However, if you have serious medical issues or chronic medical problems, these are probably not your best option  STD Testing - [facility] Department of [facility] [place], STD Clinic, [address], [place], phone [phone] or [phone].  Monday - Friday, call for an appointment. [facility] Department of [employer], STD Clinic, [address], [place], phone [phone] or [phone].  Monday - Friday, call for an appointment.  Abuse/Neglect: [facility] Child Abuse Hotline 662-675-9513 [facility] Child Abuse Hotline 778-642-8695 (After Hours)  Emergency Shelter:  [facility] [phone]  Maternity Homes: - [facility] at [facility] [place] [facility] [phone] - [facility] ([phone]  MRSA Hotline #:   3201129521  [facility] Resources  [facility] [place]  United Way [facility] Dept. [address]                 [address]         [address] [place] [address]  Blondell Reveal Phone:  [phone]                                  Phone:   [phone]                   Phone:  7650945086  [facility] Mental Health, [phone] - [phone]       -     [facility] in [place], [address]                                  [phone], [facility] Child Abuse Hotline 339 426 2360 or 310-686-3462 (After Hours)   Behavioral Health Services  Substance Abuse Resources: - Alcohol and Drug Services  ([phone] - [facility] 4032091876 - [facility] [place] [phone] Floydene Flock [phone] - Residential & Outpatient Substance Abuse Program  [phone]  Psychological Services: [facility]  [phone] Services  [phone] - [facility], [phone] [place]. [address], [place], ACCESS LINE: (931)[phone] or [phone], [employer]  Dental Assistance  If unable to pay or uninsured, contact:  [facility] or [facility]. to become qualified for the adult dental clinic.  Patients with Medicaid: [facility] [phone] [address], [phone] [address] [phone]  If unable to pay, or uninsured, contact [facility] [phone]) or [facility] Department [phone] in [place], [phone] in [facility]) to become qualified for the adult dental clinic  Other Low-Cost Community Dental Services: - [facility]- [address] [place], [place], [place], [address], [phone], [address], 2nd and 4th Thursday of the month at 6:30am.  10 clients each day by appointment, can sometimes see walk-in patients if someone does not show for an appointment. [facility]- [address] Ether Griffins [place], [place], [address], [phone] - [facility]- [address], [place], [place], [address], [phone] - [facility]- [phone] [facility]  Department- [phone] [facility] Department- [phone]

## 2011-07-15 NOTE — ED Notes (Signed)
Pt alert, nad, c/o ? Rash to arms and legs, onset several weeks ago, denies recent travel or exposures, resp even unlabored, skin pwd

## 2011-07-15 NOTE — ED Notes (Addendum)
Pt presents with a c/c of rash to arms, legs, chest, and abdomen.  Pt st's the rash first appeared 2 weeks ago, st's she takes benadryl which temporarily relieves the itching.  Pt st's she hasn't eaten any new foods, no new detergents or soaps, hasn't come into contant with any new animals, hasn't been to any new environments, hasn't started any new medications, reports NKDA.  Pt st's the rash becomes worse when she east fruits, she specifically mentioned pineapple.  Pt in nad.

## 2011-07-15 NOTE — ED Provider Notes (Signed)
History     CSN: [id number]  Arrival date & time 07/15/11  0226   First MD Initiated Contact with Patient 07/15/11 0251      Chief Complaint  Patient presents with  . Rash   HPI  History provided by the patient. Patient is a 21 year old female with no significant past medical history who presents with complaints of persistent pruritic rash. Patient reports having a rash for the past 2 weeks. Rash is located on bilateral hands, wrists lower abdomen, groin and below breasts.  Pt denies hx of similar rash in past.  Pt denies any known skin allergens.  She has no new soaps, lotions, clothing, detergents.  She has no contact with any animals.  Pt states she may have been in contact with a friend who had similar rash.  No one else in household has similar rash.   Pt has taken some benadryl for symptoms with some improvement of itch. Pt denies any other associated symptoms. Denies fever, chills, sweats, headache, N/V or neck pain.  No difficulty breathing.    Past Medical History  Diagnosis Date  . Asthma     Past Surgical History  Procedure Date  . No past surgeries     Family History  Problem Relation Age of Onset  . Diabetes Mother   . Diabetes Father   . Kidney disease Father   . Hypertension Father   . Liver disease Father     History  Substance Use Topics  . Smoking status: Never Smoker   . Smokeless tobacco: Never Used  . Alcohol Use: No    OB History    Grav Para Term Preterm Abortions TAB SAB Ect Mult Living   3 0 0 0 2 1 1  0  0      Review of Systems  Constitutional: Negative for fever and chills.  HENT: Negative for neck pain.   Respiratory: Negative for shortness of breath.   Skin: Positive for rash.  Neurological: Negative for headaches.    Allergies  Review of patient's allergies indicates no known allergies.  Home Medications  No current outpatient prescriptions on file.  BP 129/84  Pulse 63  Temp 98 F (36.7 C)  Resp 16  Wt 200 lb (90.719  kg)  SpO2 99%  LMP 12/02/2010  Breastfeeding? Unknown  Physical Exam  Nursing note and vitals reviewed. Constitutional: She is oriented to person, place, and time. She appears well-developed and well-nourished. No distress.  HENT:  Head: Normocephalic.  Cardiovascular: Normal rate and regular rhythm.   Pulmonary/Chest: Effort normal and breath sounds normal.  Abdominal: Soft. There is no tenderness.  Neurological: She is alert and oriented to person, place, and time.  Skin: Skin is warm and dry. Rash noted.       Sporadic papular rash on bilateral posterior hands, inter digital webbing, wrists, distal forearms, lower abdomen and bilateral medial thighs and groin. Secondary excoriations present.  Psychiatric: She has a normal mood and affect. Her behavior is normal.    ED Course  Procedures     1. Scabies   2. Rash       MDM  Patient seen and evaluated. Patient no acute distress.      Angus Seller, [place] 07/15/11 [phone]

## 2011-07-29 NOTE — ED Provider Notes (Signed)
Medical screening examination/treatment/procedure(s) were performed by non-physician practitioner and as supervising physician I was immediately available for consultation/collaboration.  Linder Prajapati, MD 07/29/11 0718 

## 2013-02-10 ENCOUNTER — Emergency Department (INDEPENDENT_AMBULATORY_CARE_PROVIDER_SITE_OTHER)
Admission: EM | Admit: 2013-02-10 | Discharge: 2013-02-10 | Disposition: A | Discharge status: Home / Self Care | Payer: Medicaid Other | Source: Home / Self Care | Attending: Family Medicine | Admitting: Family Medicine

## 2013-02-10 ENCOUNTER — Encounter (HOSPITAL_COMMUNITY): Payer: Self-pay | Admitting: Emergency Medicine

## 2013-02-10 DIAGNOSIS — S61219A Laceration without foreign body of unspecified finger without damage to nail, initial encounter: Secondary | ICD-10-CM

## 2013-02-10 DIAGNOSIS — Y93G1 Activity, food preparation and clean up: Secondary | ICD-10-CM

## 2013-02-10 DIAGNOSIS — Y92009 Unspecified place in unspecified non-institutional (private) residence as the place of occurrence of the external cause: Secondary | ICD-10-CM

## 2013-02-10 DIAGNOSIS — S61209A Unspecified open wound of unspecified finger without damage to nail, initial encounter: Secondary | ICD-10-CM

## 2013-02-10 NOTE — Discharge Instructions (Signed)
Thank you for coming in today. Keep the wound covered with ointment.  Change dressing on becomes dirty,  Come back if the wound gets very red painful or starts oozing pus.

## 2013-02-10 NOTE — ED Notes (Signed)
Pt c/o laceration to left index finger onset yest around 1730 with a cooking knife Laceration seems to be superficial; 1 cm... Bleeding is controlled Last tetanus w/in 2 yrs... She is alert w/no signs of acute distress.

## 2013-02-10 NOTE — ED Notes (Signed)
Applied bacitracin; non adherent dressing; 1 inch kling; wrapped w/1 inch coban

## 2013-02-10 NOTE — ED Provider Notes (Signed)
Lisa Brown is a 23 y.o. female who presents to Urgent Care today for laceration to left index finger. Patient at the radial aspect of her left distal index finger yesterday evening while preparing foods. She cleaned the wound with running water. She's here today because he is still bleeding intermittently. She last received a tetanus vaccination about 2 years ago. She feels well otherwise. She denies any weakness or numbness.   Past Medical History  Diagnosis Date  . Asthma    History  Substance Use Topics  . Smoking status: Never Smoker   . Smokeless tobacco: Never Used  . Alcohol Use: No   ROS as above Medications: No current facility-administered medications for this encounter.   No current outpatient prescriptions on file.    Exam:  BP 110/72  Pulse 74  Temp(Src) 98.3 F (36.8 C) (Oral)  Resp 18  SpO2 99%  LMP 01/25/2012 Gen: Well NAD Skin: 1 cm laceration radial 2nd digit skin overlying distal phalanx. Laceration extends through the dermis but not to bone.  Bleeds with irrigation.   The wound was copiously irrigated with sterile saline.  Antibiotic ointment and a pressure dressing was applied    Assessment and Plan: 23 y.o. female with laceration of left index finger. It is not suturable due to the delay in treatment. Tetanus is up-to-date. Wound cleaned and a dressing applied.  Discussed warning signs or symptoms. Please see discharge instructions. Patient expresses understanding.    Rodolph BongEvan S Oda Placke, MD 02/10/13 1101

## 2013-11-22 ENCOUNTER — Encounter (HOSPITAL_COMMUNITY): Payer: Self-pay | Admitting: Emergency Medicine

## 2014-01-04 ENCOUNTER — Ambulatory Visit (INDEPENDENT_AMBULATORY_CARE_PROVIDER_SITE_OTHER): Payer: BC Managed Care – PPO | Admitting: Family Medicine

## 2014-01-04 ENCOUNTER — Telehealth: Payer: Self-pay

## 2014-01-04 VITALS — BP 112/74 | HR 76 | Temp 98.6°F | Resp 18 | Ht 70.0 in | Wt 215.0 lb

## 2014-01-04 DIAGNOSIS — J029 Acute pharyngitis, unspecified: Secondary | ICD-10-CM

## 2014-01-04 LAB — POCT RAPID STREP A (OFFICE): Rapid Strep A Screen: NEGATIVE

## 2014-01-04 MED ORDER — AMOXICILLIN 875 MG PO TABS: 875.0000 mg | ORAL_TABLET | Freq: Two times a day (BID) | ORAL | Status: DC

## 2014-01-04 MED ORDER — AMOXICILLIN 500 MG PO CAPS: 1000.0000 mg | ORAL_CAPSULE | Freq: Two times a day (BID) | ORAL | Status: DC

## 2014-01-04 NOTE — Telephone Encounter (Signed)
Pharm called and reported Nat BO on 875 mg Amox. Asked Dr Alwyn RenHopper to Rx something else and he instr'd to send Amox 500 mg, 2 tabs BID #40. Sent and pharm notified.

## 2014-01-04 NOTE — Patient Instructions (Signed)
Take Tylenol or ibuprofen for discomfort  Rest her voice as possible  Take amoxicillin one twice daily  Return if worse or not improving.Pharyngitis Pharyngitis is redness, pain, and swelling (inflammation) of your pharynx.  CAUSES  Pharyngitis is usually caused by infection. Most of the time, these infections are from viruses (viral) and are part of a cold. However, sometimes pharyngitis is caused by bacteria (bacterial). Pharyngitis can also be caused by allergies. Viral pharyngitis may be spread from person to person by coughing, sneezing, and personal items or utensils (cups, forks, spoons, toothbrushes). Bacterial pharyngitis may be spread from person to person by more intimate contact, such as kissing.  SIGNS AND SYMPTOMS  Symptoms of pharyngitis include:   Sore throat.   Tiredness (fatigue).   Low-grade fever.   Headache.  Joint pain and muscle aches.  Skin rashes.  Swollen lymph nodes.  Plaque-like film on throat or tonsils (often seen with bacterial pharyngitis). DIAGNOSIS  Your health care provider will ask you questions about your illness and your symptoms. Your medical history, along with a physical exam, is often all that is needed to diagnose pharyngitis. Sometimes, a rapid strep test is done. Other lab tests may also be done, depending on the suspected cause.  TREATMENT  Viral pharyngitis will usually get better in 3-4 days without the use of medicine. Bacterial pharyngitis is treated with medicines that kill germs (antibiotics).  HOME CARE INSTRUCTIONS   Drink enough water and fluids to keep your urine clear or pale yellow.   Only take over-the-counter or prescription medicines as directed by your health care provider:   If you are prescribed antibiotics, make sure you finish them even if you start to feel better.   Do not take aspirin.   Get lots of rest.   Gargle with 8 oz of salt water ( tsp of salt per 1 qt of water) as often as every 1-2  hours to soothe your throat.   Throat lozenges (if you are not at risk for choking) or sprays may be used to soothe your throat. SEEK MEDICAL CARE IF:   You have large, tender lumps in your neck.  You have a rash.  You cough up green, yellow-brown, or bloody spit. SEEK IMMEDIATE MEDICAL CARE IF:   Your neck becomes stiff.  You drool or are unable to swallow liquids.  You vomit or are unable to keep medicines or liquids down.  You have severe pain that does not go away with the use of recommended medicines.  You have trouble breathing (not caused by a stuffy nose). MAKE SURE YOU:   Understand these instructions.  Will watch your condition.  Will get help right away if you are not doing well or get worse. Document Released: 01/07/2005 Document Revised: 10/28/2012 Document Reviewed: 09/14/2012 [facility] Patient Information 2015 [place]. This information is not intended to replace advice given to you by your health care provider. Make sure you discuss any questions you have with your health care provider.

## 2014-01-04 NOTE — Progress Notes (Signed)
Subjective: 23 year old lady who has a sore throat. She started getting sick a couple days ago. She has felt like she was running a fever. She does smoke occasionally. She has a history of having had strep in the past. She has not had a runny nose but she has had a mild cough. The throat is very sore and she is hoarse and hard to talk.  She is not on any medications. Her last menstrual cycle was November 23. She is not sexually involved. Not on any contraception.  Objective: TMs are normal. Throat very erythematous with white exudate on the tonsils. Strep test and culture were taken. There is a tiny bit of blood on the Q-tip. Neck supple with small nodes. Chest clear to auscultation. Heart regular without murmurs.  Assessment: Pharyngitis Laryngitis Cough  Plan: Strep test  Results for orders placed or performed in visit on 01/04/14  POCT rapid strep A  Result Value Ref Range   Rapid Strep A Screen Negative Negative

## 2014-01-06 ENCOUNTER — Encounter: Payer: Self-pay | Admitting: Family Medicine

## 2014-01-06 LAB — CULTURE, GROUP A STREP: ORGANISM ID, BACTERIA: NORMAL

## 2014-08-10 ENCOUNTER — Other Ambulatory Visit: Payer: Self-pay | Admitting: Physician Assistant

## 2014-08-10 ENCOUNTER — Other Ambulatory Visit (HOSPITAL_COMMUNITY)
Admission: RE | Admit: 2014-08-10 | Discharge: 2014-08-10 | Disposition: A | Discharge status: Home / Self Care | Payer: BLUE CROSS/BLUE SHIELD | Source: Ambulatory Visit | Attending: Physician Assistant | Admitting: Physician Assistant

## 2014-08-10 DIAGNOSIS — Z01419 Encounter for gynecological examination (general) (routine) without abnormal findings: Secondary | ICD-10-CM | POA: Insufficient documentation

## 2014-08-10 DIAGNOSIS — Z113 Encounter for screening for infections with a predominantly sexual mode of transmission: Secondary | ICD-10-CM | POA: Insufficient documentation

## 2014-08-11 LAB — CYTOLOGY - PAP

## 2014-11-03 ENCOUNTER — Ambulatory Visit: Payer: BLUE CROSS/BLUE SHIELD | Attending: Psychology | Admitting: Psychology

## 2015-08-03 ENCOUNTER — Inpatient Hospital Stay (HOSPITAL_COMMUNITY): Payer: Medicaid Other

## 2015-08-03 ENCOUNTER — Encounter (HOSPITAL_COMMUNITY): Payer: Self-pay

## 2015-08-03 ENCOUNTER — Inpatient Hospital Stay (HOSPITAL_COMMUNITY)
Admission: AD | Admit: 2015-08-03 | Discharge: 2015-08-03 | Disposition: A | Discharge status: Home / Self Care | Payer: Medicaid Other | Source: Ambulatory Visit | Attending: Obstetrics & Gynecology | Admitting: Obstetrics & Gynecology

## 2015-08-03 DIAGNOSIS — B9689 Other specified bacterial agents as the cause of diseases classified elsewhere: Secondary | ICD-10-CM

## 2015-08-03 DIAGNOSIS — R109 Unspecified abdominal pain: Secondary | ICD-10-CM

## 2015-08-03 DIAGNOSIS — O4691 Antepartum hemorrhage, unspecified, first trimester: Secondary | ICD-10-CM

## 2015-08-03 DIAGNOSIS — Z3A01 Less than 8 weeks gestation of pregnancy: Secondary | ICD-10-CM | POA: Diagnosis not present

## 2015-08-03 DIAGNOSIS — N760 Acute vaginitis: Secondary | ICD-10-CM

## 2015-08-03 DIAGNOSIS — O23591 Infection of other part of genital tract in pregnancy, first trimester: Secondary | ICD-10-CM | POA: Diagnosis not present

## 2015-08-03 DIAGNOSIS — O26899 Other specified pregnancy related conditions, unspecified trimester: Secondary | ICD-10-CM

## 2015-08-03 DIAGNOSIS — O209 Hemorrhage in early pregnancy, unspecified: Secondary | ICD-10-CM

## 2015-08-03 LAB — POCT PREGNANCY, URINE: PREG TEST UR: POSITIVE — AB

## 2015-08-03 LAB — CBC WITH DIFFERENTIAL/PLATELET
Basophils Absolute: 0 10*3/uL (ref 0.0–0.1)
Basophils Relative: 0 %
EOS ABS: 0.1 10*3/uL (ref 0.0–0.7)
EOS PCT: 1 %
HCT: 35.6 % — ABNORMAL LOW (ref 36.0–46.0)
Hemoglobin: 12.3 g/dL (ref 12.0–15.0)
LYMPHS ABS: 2.7 10*3/uL (ref 0.7–4.0)
Lymphocytes Relative: 34 %
MCH: 28.2 pg (ref 26.0–34.0)
MCHC: 34.6 g/dL (ref 30.0–36.0)
MCV: 81.7 fL (ref 78.0–100.0)
Monocytes Absolute: 0.3 10*3/uL (ref 0.1–1.0)
Monocytes Relative: 4 %
Neutro Abs: 4.9 10*3/uL (ref 1.7–7.7)
Neutrophils Relative %: 61 %
PLATELETS: 279 10*3/uL (ref 150–400)
RBC: 4.36 MIL/uL (ref 3.87–5.11)
RDW: 13.7 % (ref 11.5–15.5)
WBC: 8.1 10*3/uL (ref 4.0–10.5)

## 2015-08-03 LAB — URINALYSIS, ROUTINE W REFLEX MICROSCOPIC
BILIRUBIN URINE: NEGATIVE
Glucose, UA: NEGATIVE mg/dL
HGB URINE DIPSTICK: NEGATIVE
KETONES UR: NEGATIVE mg/dL
Leukocytes, UA: NEGATIVE
Nitrite: NEGATIVE
PROTEIN: NEGATIVE mg/dL
Specific Gravity, Urine: 1.02 (ref 1.005–1.030)
pH: 6 (ref 5.0–8.0)

## 2015-08-03 LAB — HCG, QUANTITATIVE, PREGNANCY: hCG, Beta Chain, Quant, S: 8710 m[IU]/mL — ABNORMAL HIGH (ref <5)

## 2015-08-03 LAB — WET PREP, GENITAL
Sperm: NONE SEEN
Trich, Wet Prep: NONE SEEN
Yeast Wet Prep HPF POC: NONE SEEN

## 2015-08-03 IMAGING — US US OB TRANSVAGINAL
1 series · 15 of 28 positions shown · non-contrast
Comparison: [DATE]

CLINICAL DATA: Pelvic cramping with vaginal spotting today.
Pregnant patient, 5 weeks and 5 days based on her last menstrual
period. Beta HCG level is [DATE].

EXAM:
OBSTETRIC <14 WK US AND TRANSVAGINAL OB US
TECHNIQUE: Both transabdominal and transvaginal ultrasound examinations were
performed for complete evaluation of the gestation as well as the
maternal uterus, adnexal regions, and pelvic cul-de-sac.
Transvaginal technique was performed to assess early pregnancy.

[Series 1: us ob transvaginal · 56 acquisitions, 15 frames shown]
[im 1/56]
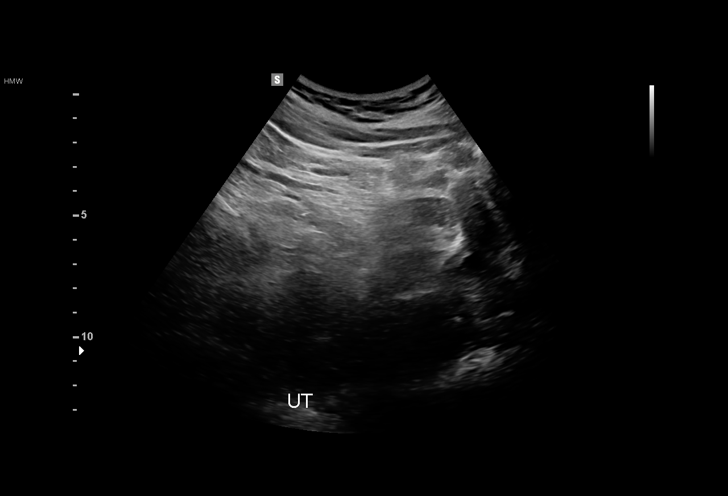
[im 5/56]
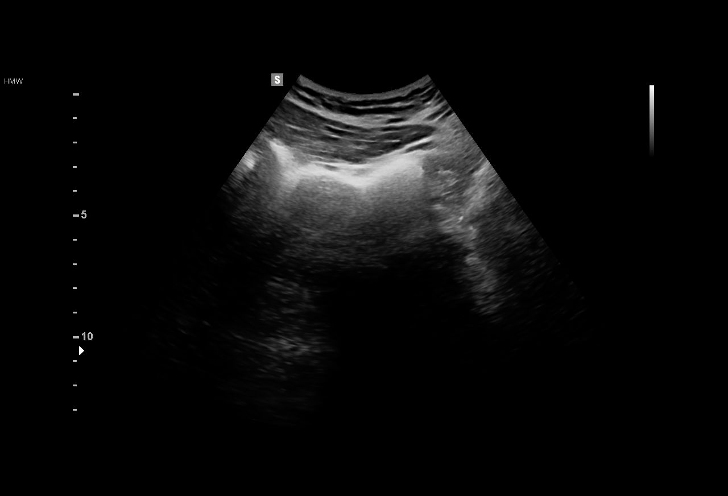
[im 9/56]
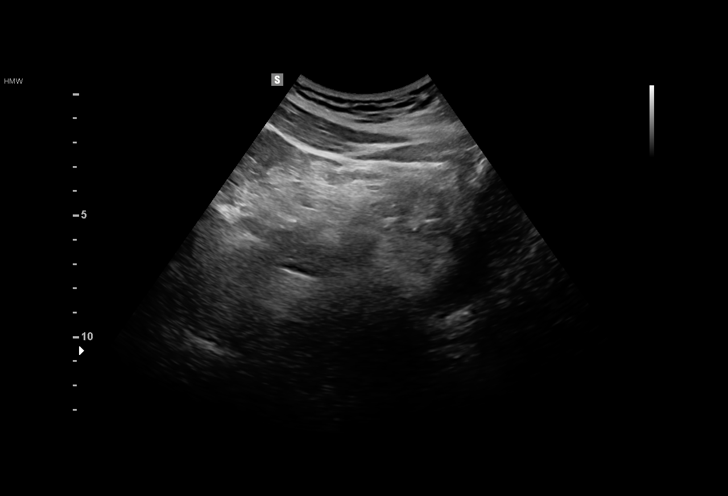
[im 13/56]
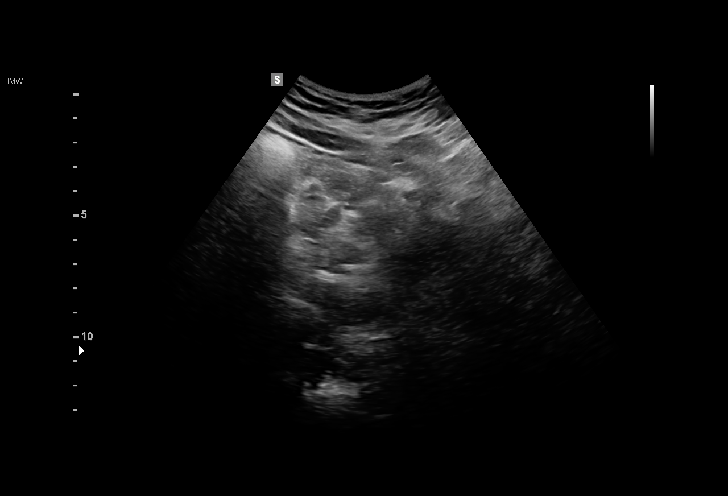
[im 17/56]
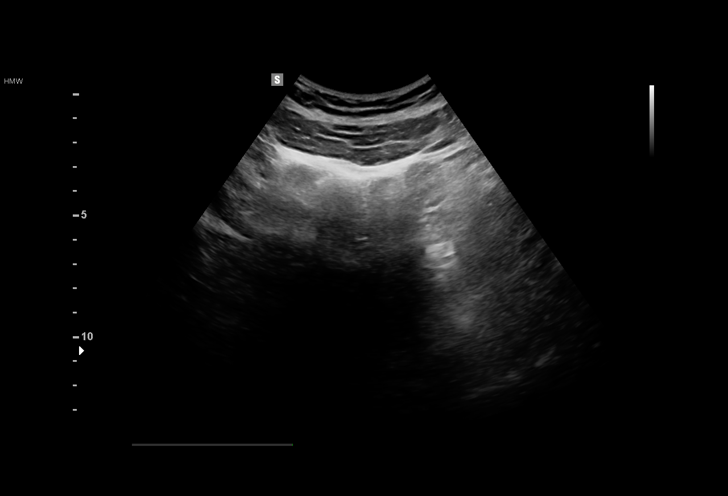
[im 21/56]
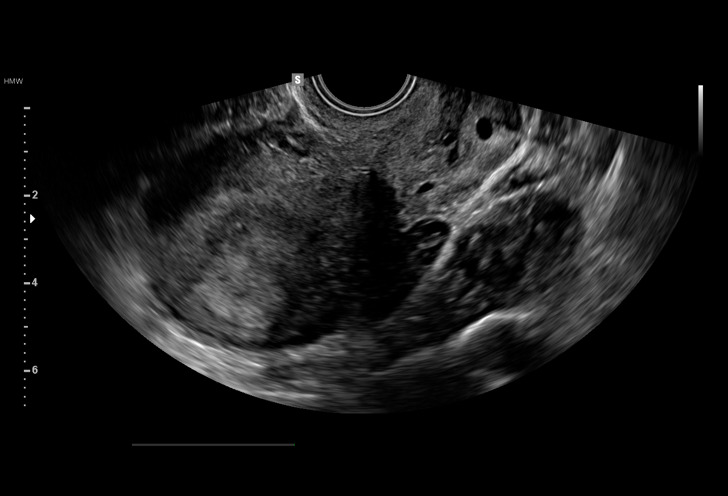
[im 25/56]
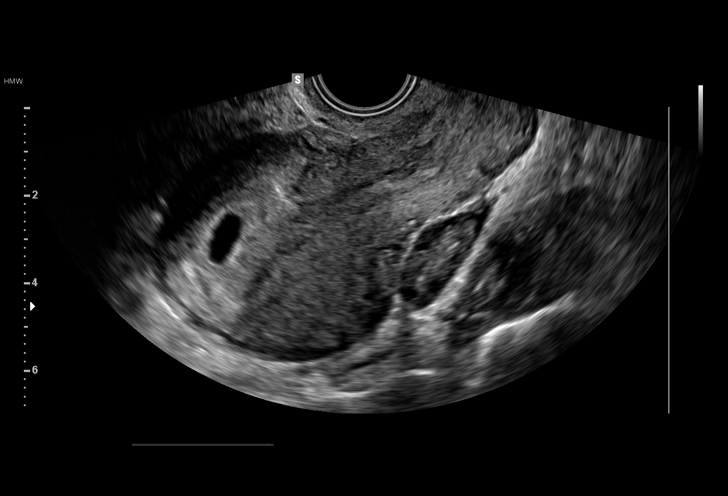
[im 29/56]
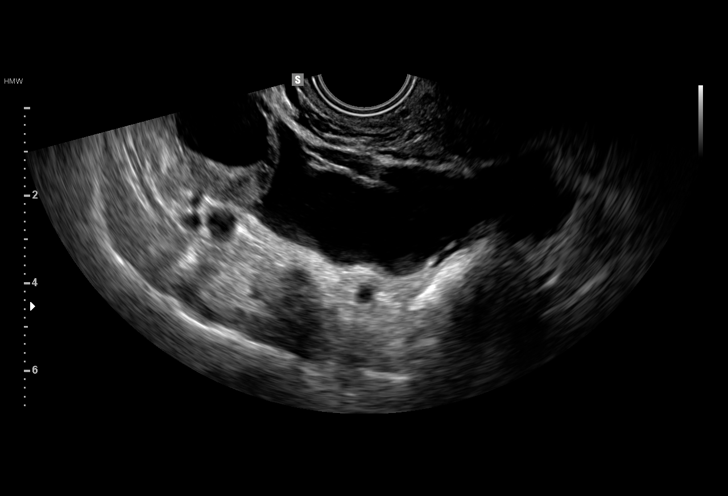
[im 31/56]
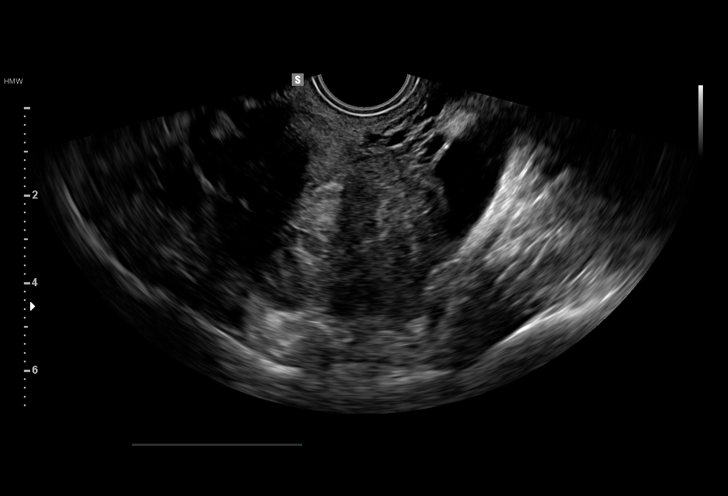
[im 35/56]
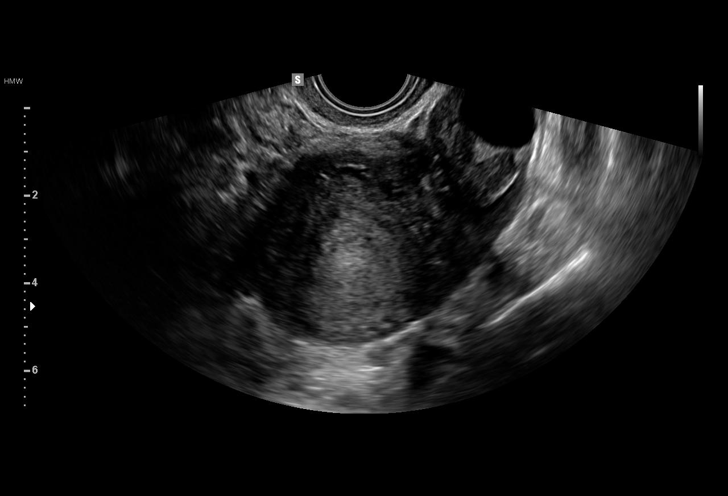
[im 39/56]
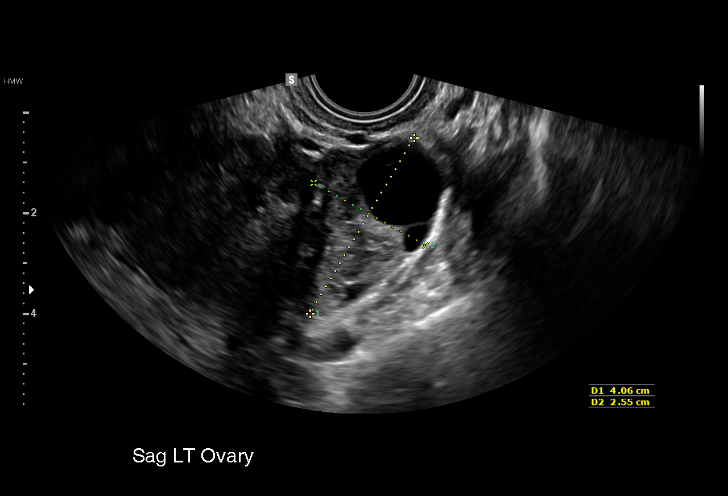
[im 43/56]
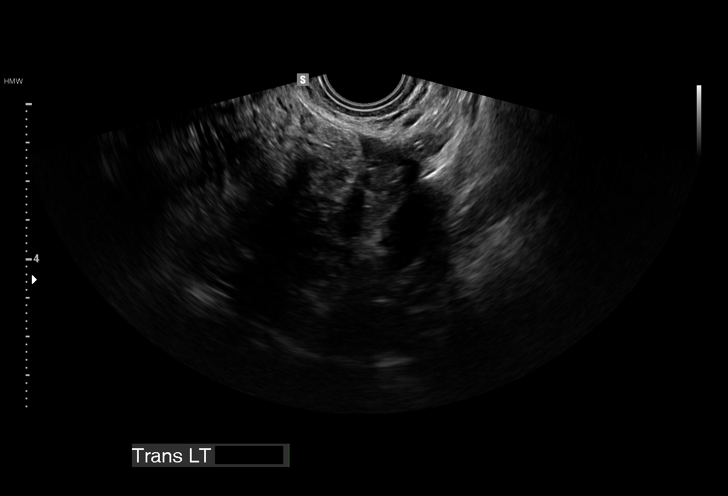
[im 47/56]
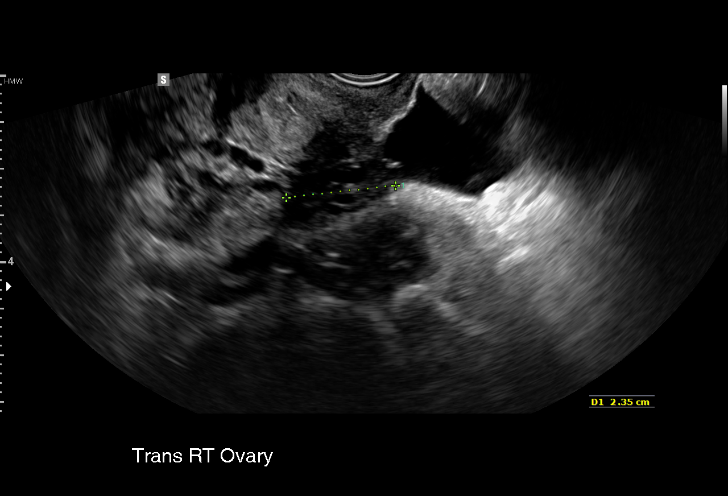
[im 51/56]
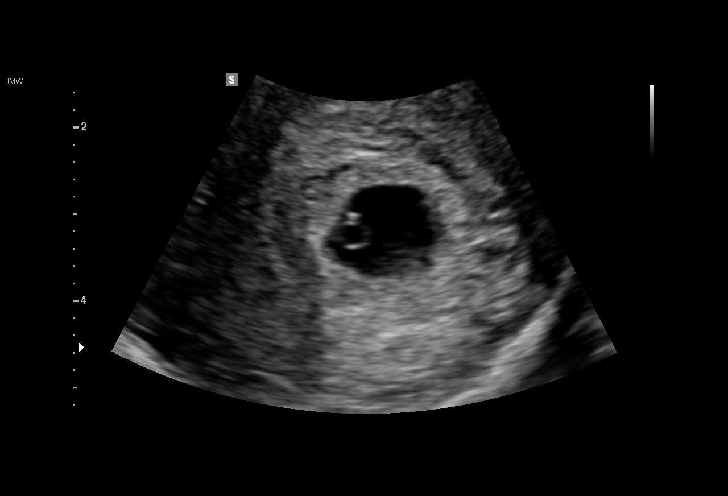
[im 56/56]
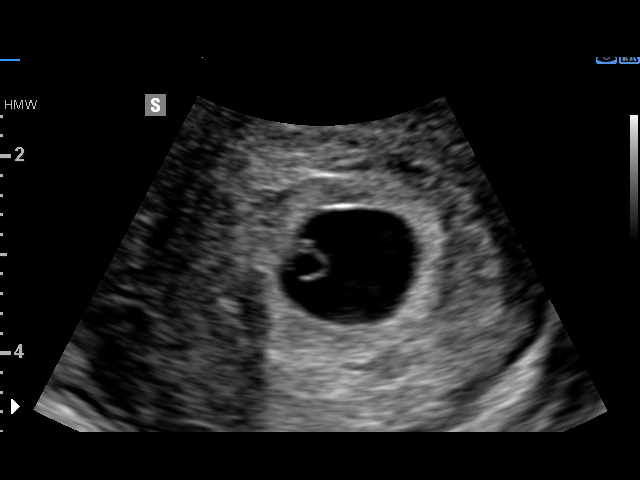

[15 of 28 positions shown; findings below may reference images not displayed]

FINDINGS: Intrauterine gestational sac: Yes

Yolk sac:  Yes

Embryo:  Probable tiny embryo

Cardiac Activity: No

Heart Rate: Not applicable  bpm

MSD: 12.5  mm   6 w   1  d

CRL:  0.9  mm

Subchorionic hemorrhage:  None visualized.

Maternal uterus/adnexae: No uterine masses. Normal ovaries and
adnexa. No significant free fluid.
IMPRESSION: 1. Findings consistent with an early intrauterine pregnancy of
approximately 6 weeks and 1 day. There is a well-formed gestational
sac and yolk sac and a probable fetal pole. No cardiac activity
detected. Recommend follow-up in 7-10 days with ultrasound to assess
for normal pregnancy progression.
2. No acute findings or evidence of a pregnancy complication.

## 2015-08-03 MED ORDER — METRONIDAZOLE 500 MG PO TABS: 500.0000 mg | ORAL_TABLET | Freq: Two times a day (BID) | ORAL | Status: DC

## 2015-08-03 NOTE — MAU Note (Signed)
Pt reports she has a positive HPT. Has had some cramping for a few days. None today and stared spotting today. Has had a miscarriage in the past and is worried.

## 2015-08-03 NOTE — Discharge Instructions (Signed)
Abdominal Pain During Pregnancy °Abdominal pain is common in pregnancy. Most of the time, it does not cause harm. There are many causes of abdominal pain. Some causes are more serious than others. Some of the causes of abdominal pain in pregnancy are easily diagnosed. Occasionally, the diagnosis takes time to understand. Other times, the cause is not determined. Abdominal pain can be a sign that something is very wrong with the pregnancy, or the pain may have nothing to do with the pregnancy at all. For this reason, always tell your health care provider if you have any abdominal discomfort. °HOME CARE INSTRUCTIONS  °Monitor your abdominal pain for any changes. The following actions may help to alleviate any discomfort you are experiencing: °· Do not have sexual intercourse or put anything in your vagina until your symptoms go away completely. °· Get plenty of rest until your pain improves. °· Drink clear fluids if you feel nauseous. Avoid solid food as long as you are uncomfortable or nauseous. °· Only take over-the-counter or prescription medicine as directed by your health care provider. °· Keep all follow-up appointments with your health care provider. °SEEK IMMEDIATE MEDICAL CARE IF: °· You are bleeding, leaking fluid, or passing tissue from the vagina. °· You have increasing pain or cramping. °· You have persistent vomiting. °· You have painful or bloody urination. °· You have a fever. °· You notice a decrease in your baby's movements. °· You have extreme weakness or feel faint. °· You have shortness of breath, with or without abdominal pain. °· You develop a severe headache with abdominal pain. °· You have abnormal vaginal discharge with abdominal pain. °· You have persistent diarrhea. °· You have abdominal pain that continues even after rest, or gets worse. °MAKE SURE YOU:  °· Understand these instructions. °· Will watch your condition. °· Will get help right away if you are not doing well or get worse. °    °This information is not intended to replace advice given to you by your health care provider. Make sure you discuss any questions you have with your health care provider. °  °Document Released: 01/07/2005 Document Revised: 10/28/2012 Document Reviewed: 08/06/2012 °Elsevier Interactive Patient Education ©2016 [employer] ° °First Trimester of Pregnancy °The first trimester of pregnancy is from week 1 until the end of week 12 (months 1 through 3). A week after a sperm fertilizes an egg, the egg will implant on the wall of the uterus. This embryo will begin to develop into a baby. Genes from you and your partner are forming the baby. The female genes determine whether the baby is a boy or a girl. At 6-8 weeks, the eyes and face are formed, and the heartbeat can be seen on ultrasound. At the end of 12 weeks, all the baby's organs are formed.  °Now that you are pregnant, you will want to do everything you can to have a healthy baby. Two of the most important things are to get good prenatal care and to follow your health care provider's instructions. Prenatal care is all the medical care you receive before the baby's birth. This care will help prevent, find, and treat any problems during the pregnancy and childbirth. °BODY CHANGES °Your body goes through many changes during pregnancy. The changes vary from woman to woman.  °· You may gain or lose a couple of pounds at first. °· You may feel sick to your stomach (nauseous) and throw up (vomit). If the vomiting is uncontrollable, call your health care   provider. °· You may tire easily. °· You may develop headaches that can be relieved by medicines approved by your health care provider. °· You may urinate more often. Painful urination may mean you have a bladder infection. °· You may develop heartburn as a result of your pregnancy. °· You may develop constipation because certain hormones are causing the muscles that push waste through your intestines to slow down. °· You  may develop hemorrhoids or swollen, bulging veins (varicose veins). °· Your breasts may begin to grow larger and become tender. Your nipples may stick out more, and the tissue that surrounds them (areola) may become darker. °· Your gums may bleed and may be sensitive to brushing and flossing. °· Dark spots or blotches (chloasma, mask of pregnancy) may develop on your face. This will likely fade after the baby is born. °· Your menstrual periods will stop. °· You may have a loss of appetite. °· You may develop cravings for certain kinds of food. °· You may have changes in your emotions from day to day, such as being excited to be pregnant or being concerned that something may go wrong with the pregnancy and baby. °· You may have more vivid and strange dreams. °· You may have changes in your hair. These can include thickening of your hair, rapid growth, and changes in texture. Some women also have hair loss during or after pregnancy, or hair that feels dry or thin. Your hair will most likely return to normal after your baby is born. °WHAT TO EXPECT AT YOUR PRENATAL VISITS °During a routine prenatal visit: °· You will be weighed to make sure you and the baby are growing normally. °· Your blood pressure will be taken. °· Your abdomen will be measured to track your baby's growth. °· The fetal heartbeat will be listened to starting around week 10 or 12 of your pregnancy. °· Test results from any previous visits will be discussed. °Your health care provider may ask you: °· How you are feeling. °· If you are feeling the baby move. °· If you have had any abnormal symptoms, such as leaking fluid, bleeding, severe headaches, or abdominal cramping. °· If you are using any tobacco products, including cigarettes, chewing tobacco, and electronic cigarettes. °· If you have any questions. °Other tests that may be performed during your first trimester include: °· Blood tests to find your blood type and to check for the presence of any  previous infections. They will also be used to check for low iron levels (anemia) and Rh antibodies. Later in the pregnancy, blood tests for diabetes will be done along with other tests if problems develop. °· Urine tests to check for infections, diabetes, or protein in the urine. °· An ultrasound to confirm the proper growth and development of the baby. °· An amniocentesis to check for possible genetic problems. °· Fetal screens for spina bifida and Down syndrome. °· You may need other tests to make sure you and the baby are doing well. °· HIV (human immunodeficiency virus) testing. Routine prenatal testing includes screening for HIV, unless you choose not to have this test. °HOME CARE INSTRUCTIONS  °Medicines °· Follow your health care provider's instructions regarding medicine use. Specific medicines may be either safe or unsafe to take during pregnancy. °· Take your prenatal vitamins as directed. °· If you develop constipation, try taking a stool softener if your health care provider approves. °Diet °· Eat regular, well-balanced meals. Choose a variety of foods, such as meat   or vegetable-based protein, fish, milk and low-fat dairy products, vegetables, fruits, and whole grain breads and cereals. Your health care provider will help you determine the amount of weight gain that is right for you. °· Avoid raw meat and uncooked cheese. These carry germs that can cause birth defects in the baby. °· Eating four or five small meals rather than three large meals a day may help relieve nausea and vomiting. If you start to feel nauseous, eating a few soda crackers can be helpful. Drinking liquids between meals instead of during meals also seems to help nausea and vomiting. °· If you develop constipation, eat more high-fiber foods, such as fresh vegetables or fruit and whole grains. Drink enough fluids to keep your urine clear or pale yellow. °Activity and Exercise °· Exercise only as directed by your health care provider.  Exercising will help you: °¨ Control your weight. °¨ Stay in shape. °¨ Be prepared for labor and delivery. °· Experiencing pain or cramping in the lower abdomen or low back is a good sign that you should stop exercising. Check with your health care provider before continuing normal exercises. °· Try to avoid standing for long periods of time. Move your legs often if you must stand in one place for a long time. °· Avoid heavy lifting. °· Wear low-heeled shoes, and practice good posture. °· You may continue to have sex unless your health care provider directs you otherwise. °Relief of Pain or Discomfort °· Wear a good support bra for breast tenderness.   °· Take warm sitz baths to soothe any pain or discomfort caused by hemorrhoids. Use hemorrhoid cream if your health care provider approves.   °· Rest with your legs elevated if you have leg cramps or low back pain. °· If you develop varicose veins in your legs, wear support hose. Elevate your feet for 15 minutes, 3-4 times a day. Limit salt in your diet. °Prenatal Care °· Schedule your prenatal visits by the twelfth week of pregnancy. They are usually scheduled monthly at first, then more often in the last 2 months before delivery. °· Write down your questions. Take them to your prenatal visits. °· Keep all your prenatal visits as directed by your health care provider. °Safety °· Wear your seat belt at all times when driving. °· Make a list of emergency phone numbers, including numbers for family, friends, the hospital, and police and fire departments. °General Tips °· Ask your health care provider for a referral to a local prenatal education class. Begin classes no later than at the beginning of month 6 of your pregnancy. °· Ask for help if you have counseling or nutritional needs during pregnancy. Your health care provider can offer advice or refer you to specialists for help with various needs. °· Do not use hot tubs, steam rooms, or saunas. °· Do not douche or use  tampons or scented sanitary pads. °· Do not cross your legs for long periods of time. °· Avoid cat litter boxes and soil used by cats. These carry germs that can cause birth defects in the baby and possibly loss of the fetus by miscarriage or stillbirth. °· Avoid all smoking, herbs, alcohol, and medicines not prescribed by your health care provider. Chemicals in these affect the formation and growth of the baby. °· Do not use any tobacco products, including cigarettes, chewing tobacco, and electronic cigarettes. If you need help quitting, ask your health care provider. You may receive counseling support and other resources to help you quit. °·   Schedule a dentist appointment. At home, brush your teeth with a soft toothbrush and be gentle when you floss. °SEEK MEDICAL CARE IF:  °· You have dizziness. °· You have mild pelvic cramps, pelvic pressure, or nagging pain in the abdominal area. °· You have persistent nausea, vomiting, or diarrhea. °· You have a bad smelling vaginal discharge. °· You have pain with urination. °· You notice increased swelling in your face, hands, legs, or ankles. °SEEK IMMEDIATE MEDICAL CARE IF:  °· You have a fever. °· You are leaking fluid from your vagina. °· You have spotting or bleeding from your vagina. °· You have severe abdominal cramping or pain. °· You have rapid weight gain or loss. °· You vomit blood or material that looks like coffee grounds. °· You are exposed to German measles and have never had them. °· You are exposed to fifth disease or chickenpox. °· You develop a severe headache. °· You have shortness of breath. °· You have any kind of trauma, such as from a fall or a car accident. °  °This information is not intended to replace advice given to you by your health care provider. Make sure you discuss any questions you have with your health care provider. °  °Document Released: 01/01/2001 Document Revised: 01/28/2014 Document Reviewed: 11/17/2012 °Elsevier Interactive Patient  Education ©2016 Elsevier Inc. ° °

## 2015-08-03 NOTE — MAU Provider Note (Signed)
Chief Complaint: Vaginal Bleeding   First Provider Initiated Contact with Patient 08/03/15 1839     SUBJECTIVE HPI: Lisa Brown is a 25 y.o. G4P0030 at [redacted]w[redacted]d by certain, regular LMP who presents to [facility] reporting ramping times several days and spotting today. Worried that she may be having a miscarriage. Positive home pregnancy test. No other testing this pregnancy.  Location: Suprapubic Quality: Cramping Severity: Mild-moderate Duration: 2-3 days Course: Unchanged Context: None Timing: Intermittent Modifying factors: There are no aggravating or alleviating factors. Associated signs and symptoms: Positive for spotting. Negative for fever, chills, vaginal discharge, passage of clots or tissue, nausea, vomiting, diarrhea, constipation or urinary complaints. No recent intercourse.  Past Medical History  Diagnosis Date  . Asthma    OB History  Gravida Para Term Preterm AB SAB TAB Ectopic Multiple Living  4 0 0 0 0  0    # Outcome Date GA Lbr Len/2nd Weight Sex Delivery Anes PTL Lv  4 Current           3 TAB           2 TAB           1 SAB              Past Surgical History  Procedure Laterality Date  . No past surgeries     Social History   Social History  . Marital Status: Single    Spouse Name: N/A  . Number of Children: N/A  . Years of Education: N/A   Occupational History  . Not on file.   Social History Main Topics  . Smoking status: Never Smoker   . Smokeless tobacco: Never Used  . Alcohol Use: No  . Drug Use: No  . Sexual Activity: Yes    Birth Control/ Protection: None   Other Topics Concern  . Not on file   Social History Narrative   No current facility-administered medications on file prior to encounter.   No current outpatient prescriptions on file prior to encounter.   No Known Allergies  I have reviewed the past Medical Hx, Surgical Hx, Social Hx, Allergies and Medications.   Review of Systems  Constitutional:  Negative for fever, chills and appetite change.  Gastrointestinal: Positive for abdominal pain. Negative for nausea, vomiting, diarrhea, constipation, blood in stool and abdominal distention.  Genitourinary: Positive for vaginal bleeding. Negative for dysuria, hematuria, flank pain and vaginal discharge.  Musculoskeletal: Negative for back pain.    OBJECTIVE Patient Vitals for the past 24 hrs:  BP Temp Temp src Pulse Resp  08/03/15 2007 106/73 mmHg 98.1 F (36.7 C) Oral 83 18  08/03/15 1738 121/75 mmHg 98.3 F (36.8 C) Axillary 87 18   Constitutional: Well-developed, well-nourished female in no acute distress.  Cardiovascular: normal rate Respiratory: normal rate and effort.  GI: Abd soft, non-tender. Pos BS x 4 MS: Extremities nontender, no edema, normal ROM Neurologic: Alert and oriented x 4.  GU:  SPECULUM EXAM: NEFG, physiologic discharge, no blood noted, cervix clean except for tiny friable area at lower edge of external os.  BIMANUAL: cervix long and closed; uterus top-normal size, no adnexal tenderness or masses. No CMT.  LAB RESULTS Results for orders placed or performed during the hospital encounter of 08/03/15 (from the past 24 hour(s))  hCG, quantitative, pregnancy     Status: Abnormal   Collection Time: 08/03/15  5:26 PM  Result Value Ref Range   hCG, Beta Chain, Quant, S  8710 (H) <5 mIU/mL  CBC with Differential/Platelet     Status: Abnormal   Collection Time: 08/03/15  5:26 PM  Result Value Ref Range   WBC 8.1 4.0 - 10.5 K/uL   RBC 4.36 3.87 - 5.11 MIL/uL   Hemoglobin 12.3 12.0 - 15.0 g/dL   HCT 11.935.6 (L) 14.736.0 - 82.946.0 %   MCV 81.7 78.0 - 100.0 fL   MCH 28.2 26.0 - 34.0 pg   MCHC 34.6 30.0 - 36.0 g/dL   RDW 56.213.7 13.011.5 - 86.515.5 %   Platelets 279 150 - 400 K/uL   Neutrophils Relative % 61 %   Neutro Abs 4.9 1.7 - 7.7 K/uL   Lymphocytes Relative 34 %   Lymphs Abs 2.7 0.7 - 4.0 K/uL   Monocytes Relative 4 %   Monocytes Absolute 0.3 0.1 - 1.0 K/uL   Eosinophils  Relative 1 %   Eosinophils Absolute 0.1 0.0 - 0.7 K/uL   Basophils Relative 0 %   Basophils Absolute 0.0 0.0 - 0.1 K/uL  Urinalysis, Routine w reflex microscopic (not at [facility])     Status: None   Collection Time: 08/03/15  5:40 PM  Result Value Ref Range   Color, Urine YELLOW YELLOW   APPearance CLEAR CLEAR   Specific Gravity, Urine 1.020 1.005 - 1.030   pH 6.0 5.0 - 8.0   Glucose, UA NEGATIVE NEGATIVE mg/dL   Hgb urine dipstick NEGATIVE NEGATIVE   Bilirubin Urine NEGATIVE NEGATIVE   Ketones, ur NEGATIVE NEGATIVE mg/dL   Protein, ur NEGATIVE NEGATIVE mg/dL   Nitrite NEGATIVE NEGATIVE   Leukocytes, UA NEGATIVE NEGATIVE  Pregnancy, urine POC     Status: Abnormal   Collection Time: 08/03/15  6:11 PM  Result Value Ref Range   Preg Test, Ur POSITIVE (A) NEGATIVE  Wet prep, genital     Status: Abnormal   Collection Time: 08/03/15  7:55 PM  Result Value Ref Range   Yeast Wet Prep HPF POC NONE SEEN NONE SEEN   Trich, Wet Prep NONE SEEN NONE SEEN   Clue Cells Wet Prep HPF POC PRESENT (A) NONE SEEN   WBC, Wet Prep HPF POC FEW (A) NONE SEEN   Sperm NONE SEEN     IMAGING [place] Ob Comp Less 14 Wks  08/03/2015  CLINICAL DATA:  Pelvic cramping with vaginal spotting today. Pregnant patient, 5 weeks and 5 days based on her last menstrual period. Beta HCG level is 8,710. EXAM: OBSTETRIC <14 WK US AND TRANSVAGINAL OB US TECHNIQUE: Both transabdominal and transvaginal ultrasound examinations were performed for complete evaluation of the gestation as well as the maternal uterus, adnexal regions, and pelvic cul-de-sac. Transvaginal technique was performed to assess early pregnancy. COMPARISON:  01/17/2011 FINDINGS: Intrauterine gestational sac: Yes Yolk sac:  Yes Embryo:  Probable tiny embryo Cardiac Activity: No Heart Rate: Not applicable  bpm MSD: 12.5  mm   6 w   1  d CRL:  0.9  mm Subchorionic hemorrhage:  None visualized. Maternal uterus/adnexae: No uterine masses. Normal ovaries and adnexa. No  significant free fluid. IMPRESSION: 1. Findings consistent with an early intrauterine pregnancy of approximately 6 weeks and 1 day. There is a well-formed gestational sac and yolk sac and a probable fetal pole. No cardiac activity detected. Recommend follow-up in 7-10 days with ultrasound to assess for normal pregnancy progression. 2. No acute findings or evidence of a pregnancy complication. Electronically Signed   By: Amie Portlandavid  Ormond M.D.   On: 08/03/2015 19:32   [place]  Ob Transvaginal  08/03/2015  CLINICAL DATA:  Pelvic cramping with vaginal spotting today. Pregnant patient, 5 weeks and 5 days based on her last menstrual period. Beta HCG level is 8,710. EXAM: OBSTETRIC <14 WK [place] AND TRANSVAGINAL OB US TECHNIQUE: Both transabdominal and transvaginal ultrasound examinations were performed for complete evaluation of the gestation as well as the maternal uterus, adnexal regions, and pelvic cul-de-sac. Transvaginal technique was performed to assess early pregnancy. COMPARISON:  01/17/2011 FINDINGS: Intrauterine gestational sac: Yes Yolk sac:  Yes Embryo:  Probable tiny embryo Cardiac Activity: No Heart Rate: Not applicable  bpm MSD: 12.5  mm   6 w   1  d CRL:  0.9  mm Subchorionic hemorrhage:  None visualized. Maternal uterus/adnexae: No uterine masses. Normal ovaries and adnexa. No significant free fluid. IMPRESSION: 1. Findings consistent with an early intrauterine pregnancy of approximately 6 weeks and 1 day. There is a well-formed gestational sac and yolk sac and a probable fetal pole. No cardiac activity detected. Recommend follow-up in 7-10 days with ultrasound to assess for normal pregnancy progression. 2. No acute findings or evidence of a pregnancy complication. Electronically Signed   By: Amie Portland M.D.   On: 08/03/2015 19:32    MAU COURSE CBC, Quant, ABO/Rh, ultrasound, wet prep and GC/chlamydia culture, UA  MDM - Pain and bleeding in early pregnancy with normal intrauterine pregnancy and  hemodynamically stable--symptoms possibly due to BV. - Viability not yet confirmed  ASSESSMENT 1. Abdominal pain affecting pregnancy, antepartum   2. Vaginal bleeding in pregnancy, first trimester   3. BV (bacterial vaginosis)     PLAN Discharge home in stable condition. SAB precautions List of providers given. Comfort measures. Pelvic rest 1 week. Rx Flagyl. Follow-up ultrasound in one week to confirm viability.     Follow-up Information    Follow up with Obstetrician of your choice.   Why:  Start prenatal care      Follow up with [facility] [place] [facility].   Why:  As needed if symptoms worsen   Contact information:   [address] [id number] mc [place] [address] [phone]       Medication List    TAKE these medications        metroNIDAZOLE 500 MG tablet  Commonly known as:  FLAGYL  Take 1 tablet (500 mg total) by mouth 2 (two) times daily.     prenatal multivitamin Tabs tablet  Take 1 tablet by mouth daily at 12 noon.       [place], [place] 08/03/2015  8:37 PM  4

## 2015-08-04 LAB — GC/CHLAMYDIA PROBE AMP (~~LOC~~) NOT AT ARMC
Chlamydia: NEGATIVE
Neisseria Gonorrhea: NEGATIVE

## 2015-08-04 LAB — HIV ANTIBODY (ROUTINE TESTING W REFLEX): HIV SCREEN 4TH GENERATION: NONREACTIVE

## 2015-08-04 LAB — RPR: RPR Ser Ql: NONREACTIVE

## 2015-08-10 ENCOUNTER — Ambulatory Visit (INDEPENDENT_AMBULATORY_CARE_PROVIDER_SITE_OTHER): Payer: Medicaid Other | Admitting: Obstetrics & Gynecology

## 2015-08-10 ENCOUNTER — Ambulatory Visit (HOSPITAL_COMMUNITY)
Admission: RE | Admit: 2015-08-10 | Discharge: 2015-08-10 | Disposition: A | Discharge status: Home / Self Care | Payer: Medicaid Other | Source: Ambulatory Visit | Attending: Advanced Practice Midwife | Admitting: Advanced Practice Midwife

## 2015-08-10 ENCOUNTER — Encounter: Payer: Self-pay | Admitting: Family Medicine

## 2015-08-10 DIAGNOSIS — O9989 Other specified diseases and conditions complicating pregnancy, childbirth and the puerperium: Secondary | ICD-10-CM | POA: Insufficient documentation

## 2015-08-10 DIAGNOSIS — Z3481 Encounter for supervision of other normal pregnancy, first trimester: Secondary | ICD-10-CM | POA: Diagnosis not present

## 2015-08-10 DIAGNOSIS — O209 Hemorrhage in early pregnancy, unspecified: Secondary | ICD-10-CM

## 2015-08-10 DIAGNOSIS — R109 Unspecified abdominal pain: Secondary | ICD-10-CM | POA: Diagnosis present

## 2015-08-10 DIAGNOSIS — O26899 Other specified pregnancy related conditions, unspecified trimester: Secondary | ICD-10-CM

## 2015-08-10 DIAGNOSIS — A499 Bacterial infection, unspecified: Secondary | ICD-10-CM | POA: Diagnosis not present

## 2015-08-10 DIAGNOSIS — Z3491 Encounter for supervision of normal pregnancy, unspecified, first trimester: Secondary | ICD-10-CM

## 2015-08-10 DIAGNOSIS — B9689 Other specified bacterial agents as the cause of diseases classified elsewhere: Secondary | ICD-10-CM

## 2015-08-10 DIAGNOSIS — Z3A01 Less than 8 weeks gestation of pregnancy: Secondary | ICD-10-CM | POA: Diagnosis not present

## 2015-08-10 DIAGNOSIS — O98811 Other maternal infectious and parasitic diseases complicating pregnancy, first trimester: Secondary | ICD-10-CM | POA: Insufficient documentation

## 2015-08-10 DIAGNOSIS — O4691 Antepartum hemorrhage, unspecified, first trimester: Secondary | ICD-10-CM | POA: Diagnosis present

## 2015-08-10 DIAGNOSIS — N760 Acute vaginitis: Secondary | ICD-10-CM | POA: Diagnosis not present

## 2015-08-10 IMAGING — US US OB TRANSVAGINAL
1 series · 15 of 28 positions shown · non-contrast
Comparison: [DATE]

CLINICAL DATA: Pregnant, follow-up viability

EXAM:
TRANSVAGINAL OB ULTRASOUND
TECHNIQUE: Transvaginal ultrasound was performed for complete evaluation of the
gestation as well as the maternal uterus, adnexal regions, and
pelvic cul-de-sac.

[Series 1: us ob transvaginal · 37 acquisitions, 15 frames shown]
[im 1/37]
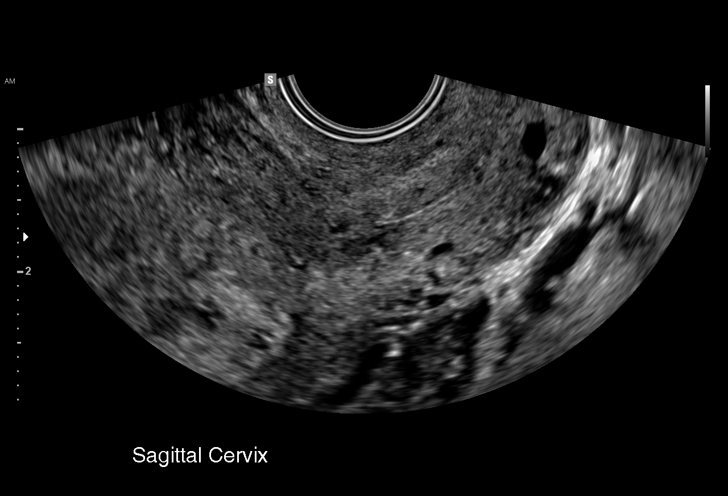
[im 3/37]
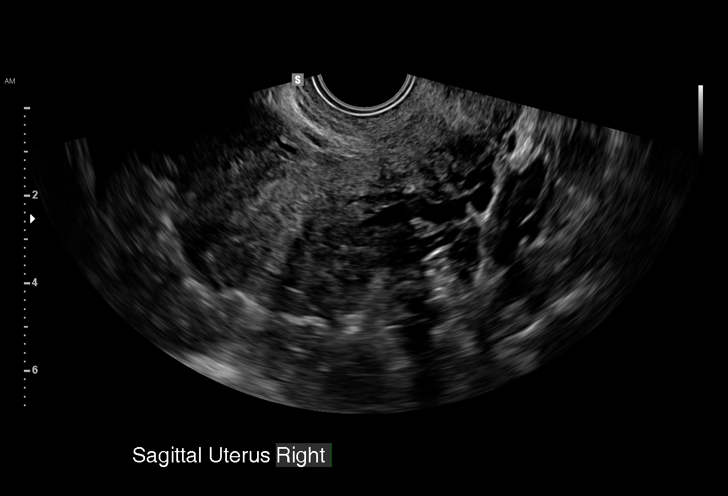
[im 6/37]
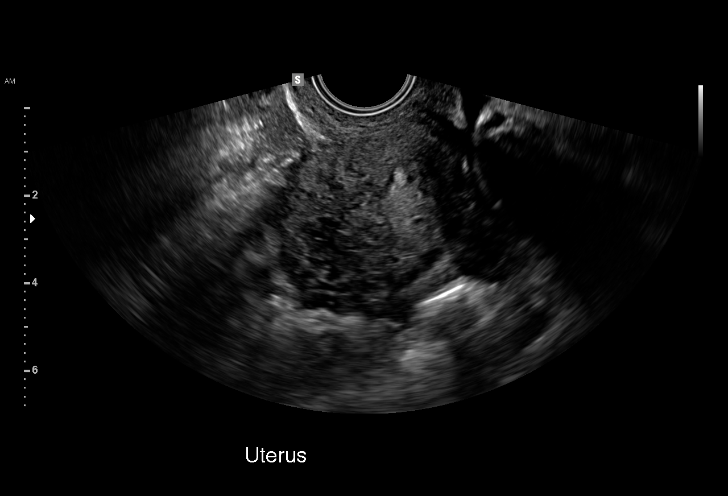
[im 9/37]
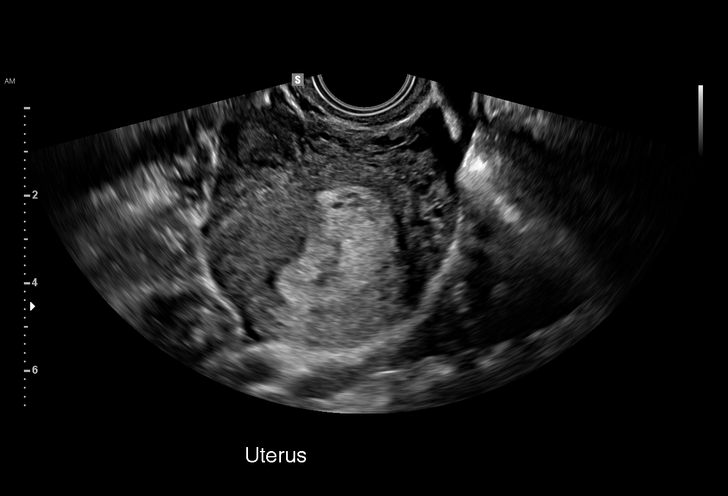
[im 11/37]
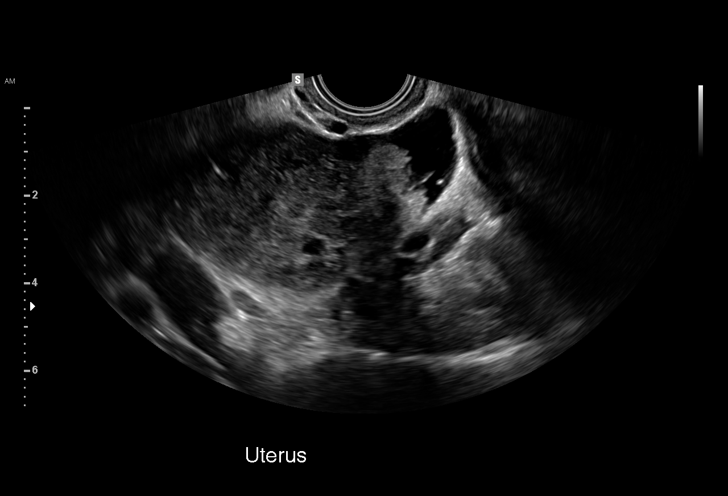
[im 14/37]
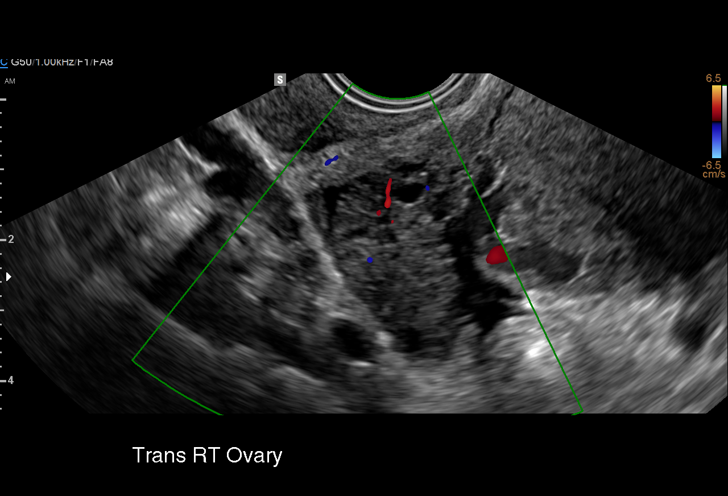
[im 17/37]
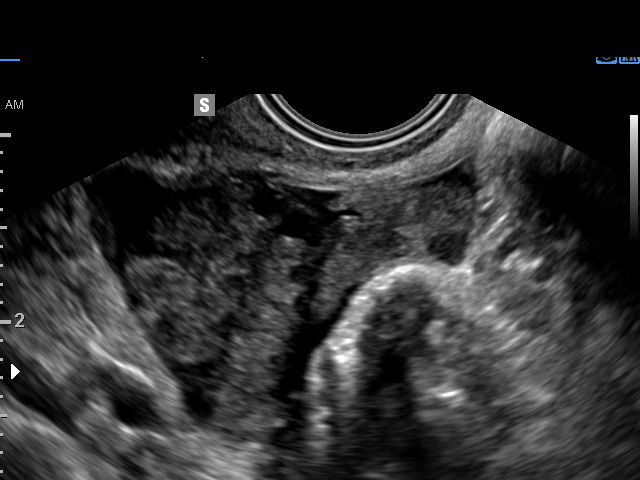
[im 19/37]
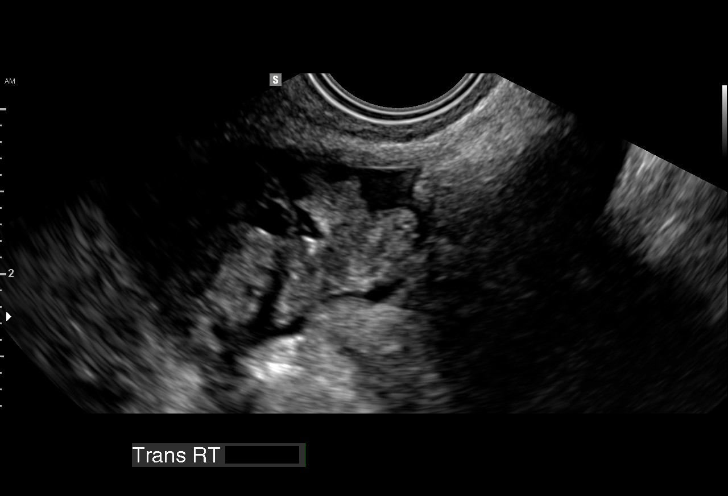
[im 21/37]
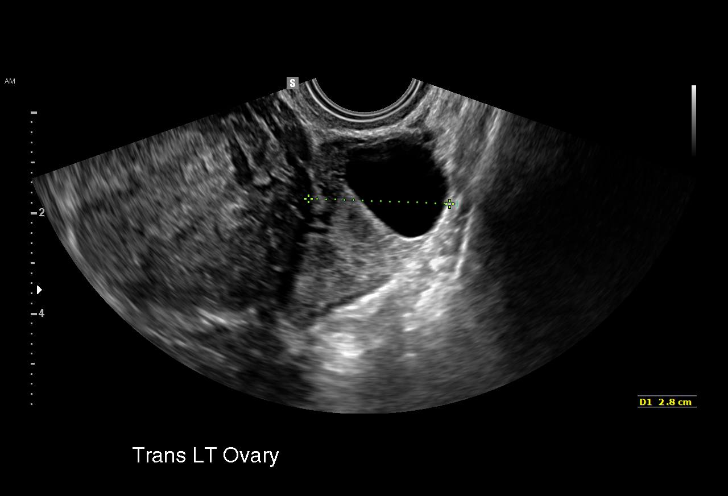
[im 23/37]
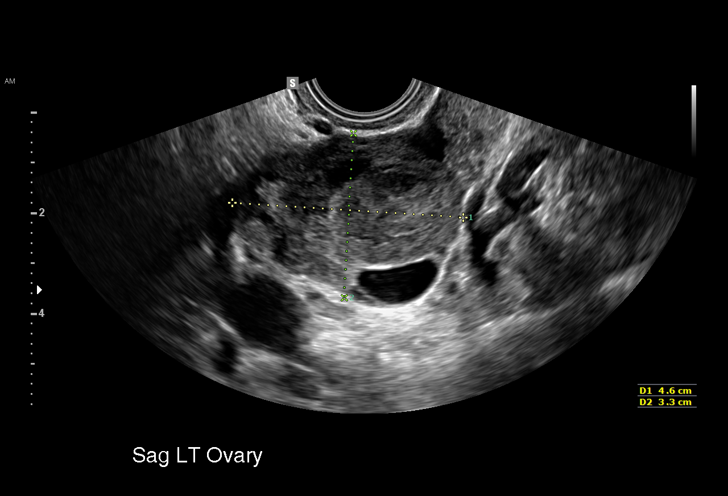
[im 26/37]
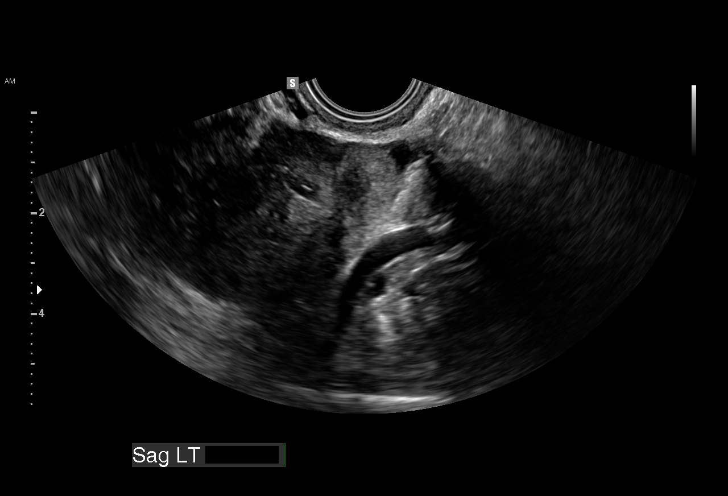
[im 29/37]
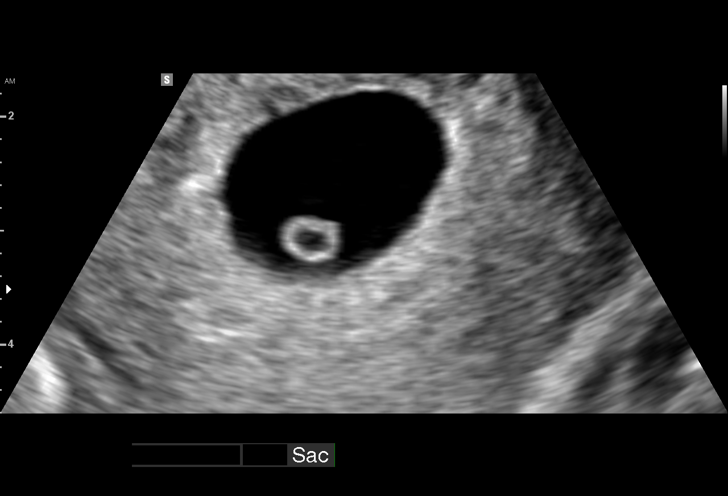
[im 31/37]
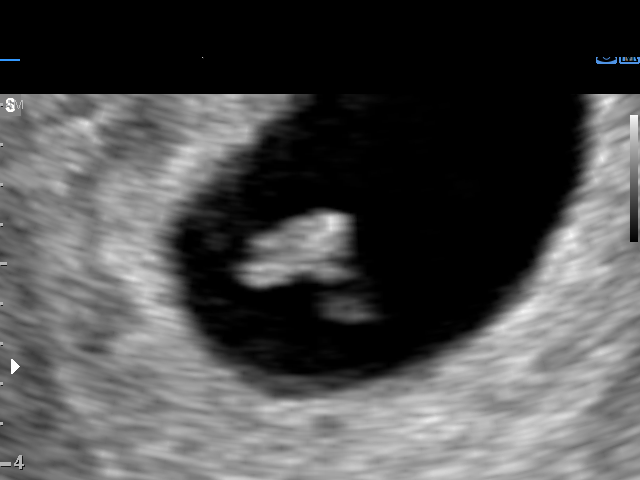
[im 34/37]
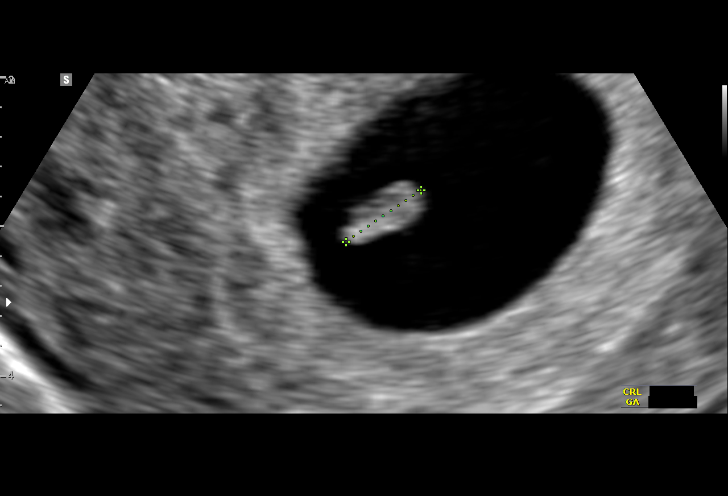
[im 37/37]
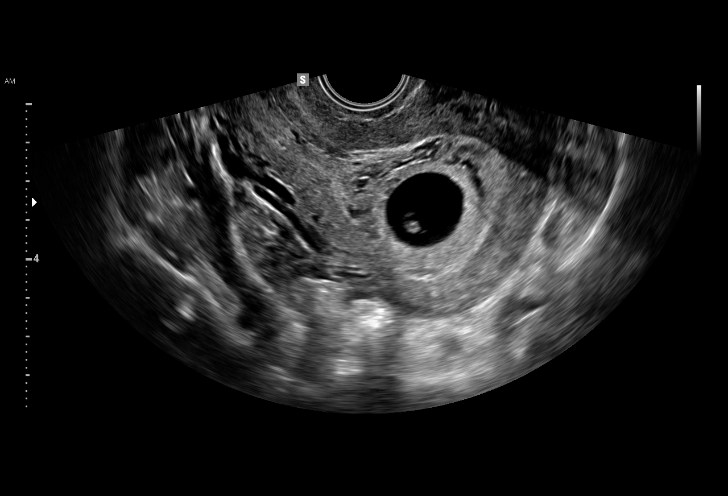

[15 of 28 positions shown; findings below may reference images not displayed]

FINDINGS: Intrauterine gestational sac: Single

Yolk sac:  Present

Embryo:  Present

Cardiac Activity: Present

Heart Rate: 116 bpm

CRL:   6.2  mm   6 w 3 d                  US EDC: [DATE]

Subchorionic hemorrhage:  None visualized.

Maternal uterus/adnexae: Bilateral ovaries are within normal limits,
noting a left corpus luteal cyst.

Small volume pelvic ascites.
IMPRESSION: Single live intrauterine gestation, with estimated gestational age 6
weeks 3 days by crown-rump length.

## 2015-08-10 NOTE — Progress Notes (Signed)
Ultrasounds Results Note  HPI:  Ms. Lisa Brown is a 25 y.o. G4P0030 at [redacted]w[redacted]d by LMP who presents to the [facility] for followup ultrasound results. The patient denies abdominal pain or vaginal bleeding.  Upon review of the patient's records, patient was first seen in [facility] on 08/03/15 for spotting.   BHCG on that day was 8710.  Ultrasound showed 6 week sac and probable pole.  Last seen in [facility] on 7/13. .  Repeat ultrasound was performed earlier today.   Past Medical History  Diagnosis Date  . Asthma    Past Surgical History  Procedure Laterality Date  . No past surgeries     Social History   Social History  . Marital Status: Single    Spouse Name: N/A  . Number of Children: N/A  . Years of Education: N/A   Occupational History  . Not on file.   Social History Main Topics  . Smoking status: Never Smoker   . Smokeless tobacco: Never Used  . Alcohol Use: No  . Drug Use: No  . Sexual Activity: Yes    Birth Control/ Protection: None   Other Topics Concern  . Not on file   Social History Narrative   Current Outpatient Prescriptions on File Prior to Visit  Medication Sig Dispense Refill  . metroNIDAZOLE (FLAGYL) 500 MG tablet Take 1 tablet (500 mg total) by mouth 2 (two) times daily. 14 tablet 0  . Prenatal Vit-Fe Fumarate-FA (PRENATAL MULTIVITAMIN) TABS tablet Take 1 tablet by mouth daily at 12 noon.     No current facility-administered medications on file prior to visit.   No Known Allergies  I have reviewed patient's Past Medical Hx, Surgical Hx, Family Hx, Social Hx, medications and allergies.   Review of Systems Review of Systems  Constitutional: Negative for fever and chills.  Gastrointestinal: Negative for nausea, vomiting, abdominal pain, diarrhea and constipation.  Genitourinary: Negative for dysuria.  Musculoskeletal: Negative for back pain.  Neurological: Negative for dizziness and weakness.    Physical Exam  LMP 06/24/2015  GENERAL:  Well-developed, well-nourished female in no acute distress.  HEENT: Normocephalic, atraumatic.   LUNGS: Effort normal ABDOMEN: soft, non-tender HEART: Regular rate  SKIN: Warm, dry and without erythema PSYCH: Normal mood and affect NEURO: Alert and oriented x 4  LAB RESULTS No results found for this or any previous visit (from the past 24 hour(s)).  IMAGING US Ob Comp Less 14 Wks  08/03/2015  CLINICAL DATA:  Pelvic cramping with vaginal spotting today. Pregnant patient, 5 weeks and 5 days based on her last menstrual period. Beta HCG level is 8,710. EXAM: OBSTETRIC <14 WK [place] AND TRANSVAGINAL OB US TECHNIQUE: Both transabdominal and transvaginal ultrasound examinations were performed for complete evaluation of the gestation as well as the maternal uterus, adnexal regions, and pelvic cul-de-sac. Transvaginal technique was performed to assess early pregnancy. COMPARISON:  01/17/2011 FINDINGS: Intrauterine gestational sac: Yes Yolk sac:  Yes Embryo:  Probable tiny embryo Cardiac Activity: No Heart Rate: Not applicable  bpm MSD: 12.5  mm   6 w   1  d CRL:  0.9  mm Subchorionic hemorrhage:  None visualized. Maternal uterus/adnexae: No uterine masses. Normal ovaries and adnexa. No significant free fluid. IMPRESSION: 1. Findings consistent with an early intrauterine pregnancy of approximately 6 weeks and 1 day. There is a well-formed gestational sac and yolk sac and a probable fetal pole. No cardiac activity detected. Recommend follow-up in 7-10 days with ultrasound to assess for  normal pregnancy progression. 2. No acute findings or evidence of a pregnancy complication. Electronically Signed   By: Amie Portlandavid  Ormond M.D.   On: 08/03/2015 19:32   [place] Ob Transvaginal  08/10/2015  CLINICAL DATA:  Pregnant, follow-up viability EXAM: TRANSVAGINAL OB ULTRASOUND TECHNIQUE: Transvaginal ultrasound was performed for complete evaluation of the gestation as well as the maternal uterus, adnexal regions, and pelvic cul-de-sac.  COMPARISON:  08/03/2015 FINDINGS: Intrauterine gestational sac: Single Yolk sac:  Present Embryo:  Present Cardiac Activity: Present Heart Rate: 116 bpm CRL:   6.2  mm   6 w 3 d                  US EDC: 04/01/2016 Subchorionic hemorrhage:  None visualized. Maternal uterus/adnexae: Bilateral ovaries are within normal limits, noting a left corpus luteal cyst. Small volume pelvic ascites. IMPRESSION: Single live intrauterine gestation, with estimated gestational age [redacted] weeks 3 days by crown-rump length. Electronically Signed   By: Charline BillsSriyesh  Krishnan M.D.   On: 08/10/2015 08:50   [place] Ob Transvaginal  08/03/2015  CLINICAL DATA:  Pelvic cramping with vaginal spotting today. Pregnant patient, 5 weeks and 5 days based on her last menstrual period. Beta HCG level is 8,710. EXAM: OBSTETRIC <14 WK US AND TRANSVAGINAL OB US TECHNIQUE: Both transabdominal and transvaginal ultrasound examinations were performed for complete evaluation of the gestation as well as the maternal uterus, adnexal regions, and pelvic cul-de-sac. Transvaginal technique was performed to assess early pregnancy. COMPARISON:  01/17/2011 FINDINGS: Intrauterine gestational sac: Yes Yolk sac:  Yes Embryo:  Probable tiny embryo Cardiac Activity: No Heart Rate: Not applicable  bpm MSD: 12.5  mm   6 w   1  d CRL:  0.9  mm Subchorionic hemorrhage:  None visualized. Maternal uterus/adnexae: No uterine masses. Normal ovaries and adnexa. No significant free fluid. IMPRESSION: 1. Findings consistent with an early intrauterine pregnancy of approximately 6 weeks and 1 day. There is a well-formed gestational sac and yolk sac and a probable fetal pole. No cardiac activity detected. Recommend follow-up in 7-10 days with ultrasound to assess for normal pregnancy progression. 2. No acute findings or evidence of a pregnancy complication. Electronically Signed   By: Amie Portlandavid  Ormond M.D.   On: 08/03/2015 19:32    ASSESSMENT No diagnosis found. 6.3 week by [place]  viable PLAN Discharge home in stable condition Patient advised to start/continue taking prenatal vitamins Pregnancy confirmation letter given Patient advised to start prenatal care with OB provider of choice as soon as possible  Adam PhenixJames G Monica Zahler, MD  08/10/2015  9:54 AM

## 2015-08-10 NOTE — Patient Instructions (Signed)
Prenatal Care °WHAT IS PRENATAL CARE?  °Prenatal care is the process of caring for a pregnant woman before she gives birth. Prenatal care makes sure that she and her baby remain as healthy as possible throughout pregnancy. Prenatal care may be provided by a midwife, family practice health care provider, or a childbirth and pregnancy specialist (obstetrician). Prenatal care may include physical examinations, testing, treatments, and education on nutrition, lifestyle, and social support services. °WHY IS PRENATAL CARE SO IMPORTANT?  °Early and consistent prenatal care increases the chance that you and your baby will remain healthy throughout your pregnancy. This type of care also decreases a baby's risk of being born too early (prematurely), or being born smaller than expected (small for gestational age). Any underlying medical conditions you may have that could pose a risk during your pregnancy are discussed during prenatal care visits. You will also be monitored regularly for any new conditions that may arise during your pregnancy so they can be treated quickly and effectively. °WHAT HAPPENS DURING PRENATAL CARE VISITS? °Prenatal care visits may include the following: °Discussion °Tell your health care provider about any new signs or symptoms you have experienced since your last visit. These might include: °· Nausea or vomiting. °· Increased or decreased level of energy. °· Difficulty sleeping. °· Back or leg pain. °· Weight changes. °· Frequent urination. °· Shortness of breath with physical activity. °· Changes in your skin, such as the development of a rash or itchiness. °· Vaginal discharge or bleeding. °· Feelings of excitement or nervousness. °· Changes in your baby's movements. °You may want to write down any questions or topics you want to discuss with your health care provider and bring them with you to your appointment. °Examination °During your first prenatal care visit, you will likely have a complete  physical exam. Your health care provider will often examine your vagina, cervix, and the position of your uterus, as well as check your heart, lungs, and other body systems. As your pregnancy progresses, your health care provider will measure the size of your uterus and your baby's position inside your uterus. He or she may also examine you for early signs of labor. Your prenatal visits may also include checking your blood pressure and, after about 10-12 weeks of pregnancy, listening to your baby's heartbeat. °Testing °Regular testing often includes: °· Urinalysis. This checks your urine for glucose, protein, or signs of infection. °· Blood count. This checks the levels of white and red blood cells in your body. °· Tests for sexually transmitted infections (STIs). Testing for STIs at the beginning of pregnancy is routinely done and is required in many states. °· Antibody testing. You will be checked to see if you are immune to certain illnesses, such as rubella, that can affect a developing fetus. °· Glucose screen. Around 24-28 weeks of pregnancy, your blood glucose level will be checked for signs of gestational diabetes. Follow-up tests may be recommended. °· Group B strep. This is a bacteria that is commonly found inside a woman's vagina. This test will inform your health care provider if you need an antibiotic to reduce the amount of this bacteria in your body prior to labor and childbirth. °· Ultrasound. Many pregnant women undergo an ultrasound screening around 18-20 weeks of pregnancy to evaluate the health of the fetus and check for any developmental abnormalities. °· HIV (human immunodeficiency virus) testing. Early in your pregnancy, you will be screened for HIV. If you are at high risk for HIV, this test   may be repeated during your third trimester of pregnancy. °You may be offered other testing based on your age, personal or family medical history, or other factors.  °HOW OFTEN SHOULD I PLAN TO SEE MY  HEALTH CARE PROVIDER FOR PRENATAL CARE? °Your prenatal care check-up schedule depends on any medical conditions you have before, or develop during, your pregnancy. If you do not have any underlying medical conditions, you will likely be seen for checkups: °· Monthly, during the first 6 months of pregnancy. °· Twice a month during months 7 and 8 of pregnancy. °· Weekly starting in the 9th month of pregnancy and until delivery. °If you develop signs of early labor or other concerning signs or symptoms, you may need to see your health care provider more often. Ask your health care provider what prenatal care schedule is best for you. °WHAT CAN I DO TO KEEP MYSELF AND MY BABY AS HEALTHY AS POSSIBLE DURING MY PREGNANCY? °· Take a prenatal vitamin containing 400 micrograms (0.4 mg) of folic acid every day. Your health care provider may also ask you to take additional vitamins such as iodine, vitamin D, iron, copper, and zinc. °· Take 1500-2000 mg of calcium daily starting at your 20th week of pregnancy until you deliver your baby. °· Make sure you are up to date on your vaccinations. Unless directed otherwise by your health care provider: °¨ You should receive a tetanus, diphtheria, and pertussis (Tdap) vaccination between the 27th and 36th week of your pregnancy, regardless of when your last Tdap immunization occurred. This helps protect your baby from whooping cough (pertussis) after he or she is born. °¨ You should receive an annual inactivated influenza vaccine (IIV) to help protect you and your baby from influenza. This can be done at any point during your pregnancy. °· Eat a well-rounded diet that includes: °¨ Fresh fruits and vegetables. °¨ Lean proteins. °¨ Calcium-rich foods such as milk, yogurt, hard cheeses, and dark, leafy greens. °¨ Whole grain breads. °· Do not eat seafood high in mercury, including: °¨ Swordfish. °¨ Tilefish. °¨ Shark. °¨ King mackerel. °¨ More than 6 oz tuna per week. °· Do not eat: °¨ Raw  or undercooked meats or eggs. °¨ Unpasteurized foods, such as soft cheeses (brie, blue, or feta), juices, and milks. °¨ Lunch meats. °¨ Hot dogs that have not been heated until they are steaming. °· Drink enough water to keep your urine clear or pale yellow. For many women, this may be 10 or more 8 oz glasses of water each day. Keeping yourself hydrated helps deliver nutrients to your baby and may prevent the start of pre-term uterine contractions. °· Do not use any tobacco products including cigarettes, chewing tobacco, or electronic cigarettes. If you need help quitting, ask your health care provider. °· Do not drink beverages containing alcohol. No safe level of alcohol consumption during pregnancy has been determined. °· Do not use any illegal drugs. These can harm your developing baby or cause a miscarriage. °· Ask your health care provider or pharmacist before taking any prescription or over-the-counter medicines, herbs, or supplements. °· Limit your caffeine intake to no more than 200 mg per day. °· Exercise. Unless told otherwise by your health care provider, try to get 30 minutes of moderate exercise most days of the week. Do not  do high-impact activities, contact sports, or activities with a high risk of falling, such as horseback riding or downhill skiing. °· Get plenty of rest. °· Avoid anything that raises your   body temperature, such as hot tubs and saunas. °· If you own a cat, do not empty its litter box. Bacteria contained in cat feces can cause an infection called toxoplasmosis. This can result in serious harm to the fetus. °· Stay away from chemicals such as insecticides, lead, mercury, and cleaning or paint products that contain solvents. °· Do not have any X-rays taken unless medically necessary. °· Take a childbirth and breastfeeding preparation class. Ask your health care provider if you need a referral or recommendation. °  °This information is not intended to replace advice given to you by  your health care provider. Make sure you discuss any questions you have with your health care provider. °  °Document Released: 01/10/2003 Document Revised: 01/28/2014 Document Reviewed: 03/24/2013 °Elsevier Interactive Patient Education ©2016 Elsevier Inc. ° °

## 2015-08-15 ENCOUNTER — Telehealth: Payer: Self-pay | Admitting: *Deleted

## 2015-08-15 NOTE — Telephone Encounter (Signed)
Received a call left on nurse line by patient on 08/15/15 at 1036.  Patient states at her last visit she was given PNV and medication for BV.  States she has been unable to keep any of these medications down.  Wants to know what she should do.  Requests a return call to [phone].

## 2015-08-16 ENCOUNTER — Ambulatory Visit (INDEPENDENT_AMBULATORY_CARE_PROVIDER_SITE_OTHER): Payer: Medicaid Other | Admitting: Advanced Practice Midwife

## 2015-08-16 ENCOUNTER — Encounter (HOSPITAL_COMMUNITY): Payer: Self-pay | Admitting: Advanced Practice Midwife

## 2015-08-16 ENCOUNTER — Encounter: Payer: Self-pay | Admitting: Advanced Practice Midwife

## 2015-08-16 VITALS — BP 106/57 | HR 76 | Ht 70.0 in | Wt 249.3 lb

## 2015-08-16 DIAGNOSIS — O219 Vomiting of pregnancy, unspecified: Secondary | ICD-10-CM

## 2015-08-16 NOTE — Progress Notes (Signed)
Here today for c/o nausea and vomiting, and vaginal discharge. Has new ob appt later this month. States took flagyl but made her really sick- not sure that she kept the medicine down. Has one pill left.

## 2015-08-16 NOTE — Patient Instructions (Signed)
Morning Sickness Morning sickness is when you feel sick to your stomach (nauseous) during pregnancy. This nauseous feeling may or may not come with vomiting. It often occurs in the morning but can be a problem any time of day. Morning sickness is most common during the first trimester, but it may continue throughout pregnancy. While morning sickness is unpleasant, it is usually harmless unless you develop severe and continual vomiting (hyperemesis gravidarum). This condition requires more intense treatment.  CAUSES  The cause of morning sickness is not completely known but seems to be related to normal hormonal changes that occur in pregnancy. RISK FACTORS You are at greater risk if you:  Experienced nausea or vomiting before your pregnancy.  Had morning sickness during a previous pregnancy.  Are pregnant with more than one baby, such as twins. TREATMENT  Do not use any medicines (prescription, over-the-counter, or herbal) for morning sickness without first talking to your health care provider. Your health care provider may prescribe or recommend:  Vitamin B6 supplements.  Anti-nausea medicines.  The herbal medicine ginger. HOME CARE INSTRUCTIONS   Only take over-the-counter or prescription medicines as directed by your health care provider.  Taking multivitamins before getting pregnant can prevent or decrease the severity of morning sickness in most women.  Eat a piece of dry toast or unsalted crackers before getting out of bed in the morning.  Eat five or six small meals a day.  Eat dry and bland foods (rice, baked potato). Foods high in carbohydrates are often helpful.  Do not drink liquids with your meals. Drink liquids between meals.  Avoid greasy, fatty, and spicy foods.  Get someone to cook for you if the smell of any food causes nausea and vomiting.  If you feel nauseous after taking prenatal vitamins, take the vitamins at night or with a snack.  Snack on protein  foods (nuts, yogurt, cheese) between meals if you are hungry.  Eat unsweetened gelatins for desserts.  Wearing an acupressure wristband (worn for sea sickness) may be helpful.  Acupuncture may be helpful.  Do not smoke.  Get a humidifier to keep the air in your house free of odors.  Get plenty of fresh air. SEEK MEDICAL CARE IF:   Your home remedies are not working, and you need medicine.  You feel dizzy or lightheaded.  You are losing weight. SEEK IMMEDIATE MEDICAL CARE IF:   You have persistent and uncontrolled nausea and vomiting.  You pass out (faint). MAKE SURE YOU:  Understand these instructions.  Will watch your condition.  Will get help right away if you are not doing well or get worse.   This information is not intended to replace advice given to you by your health care provider. Make sure you discuss any questions you have with your health care provider.   Document Released: 02/28/2006 Document Revised: 01/12/2013 Document Reviewed: 06/24/2012 Elsevier Interactive Patient Education 2016 [employer].  Take 2 tablets at bedtime. If symptoms persist, add 1 tab in the AM starting on day 3. If symptoms persist, add 1 tab in the PM starting day 4.

## 2015-08-18 NOTE — Telephone Encounter (Signed)
Lisa Brown called and was brought in for a same day appt 08/16/15 and was seen for these complaints.

## 2015-08-20 NOTE — Progress Notes (Signed)
Subjective:  Lisa Brown is a 25 y.o. G4P0030 at [redacted]w[redacted]d being seen today for ongoing prenatal care.  She is currently monitored for the following issues for this low-risk pregnancy and has Nausea/vomiting in pregnancy on her problem list.  Patient reports nausea.   .  .   . Denies leaking of fluid.   The following portions of the patient's history were reviewed and updated as appropriate: allergies, current medications, past family history, past medical history, past social history, past surgical history and problem list. Problem list updated.  Objective:   Vitals:   08/16/15 1001  BP: (!) 106/57  Pulse: 76  Weight: 249 lb 4.8 oz (113.1 kg)  Height: 5\' 10"  (1.778 m)    Fetal Status:           General:  Alert, oriented and cooperative. Patient is in no acute distress.  Skin: Skin is warm and dry. No rash noted.   Cardiovascular: Normal heart rate noted  Respiratory: Normal respiratory effort, no problems with respiration noted  Abdomen: Soft, gravid, appropriate for gestational age.       Pelvic:  Cervical exam deferred        Extremities: Normal range of motion.     Mental Status: Normal mood and affect. Normal behavior. Normal judgment and thought content.   Urinalysis:      Assessment and Plan:  Pregnancy: G4P0030 at [redacted]w[redacted]d                      Nausea and vomiting of early pregnancy  Will try Diclegis for nausea.  Patient wants to try the Unisom/B6 since she does not have insurance yet Advised since she took most of her Flagyl her BV is probably treated  Preterm labor symptoms and general obstetric precautions including but not limited to vaginal bleeding, contractions, leaking of fluid and fetal movement were reviewed in detail with the patient. Please refer to After Visit Summary for other counseling recommendations.  Return for scheduled new ob.   Aviva Signs, CNM

## 2015-09-20 ENCOUNTER — Encounter: Payer: Self-pay | Admitting: Family

## 2015-09-20 ENCOUNTER — Other Ambulatory Visit: Payer: Self-pay | Admitting: Family

## 2015-09-20 ENCOUNTER — Ambulatory Visit (INDEPENDENT_AMBULATORY_CARE_PROVIDER_SITE_OTHER): Payer: Medicaid Other | Admitting: Family

## 2015-09-20 ENCOUNTER — Other Ambulatory Visit (HOSPITAL_COMMUNITY)
Admission: RE | Admit: 2015-09-20 | Discharge: 2015-09-20 | Disposition: A | Discharge status: Home / Self Care | Payer: Medicaid Other | Source: Ambulatory Visit | Attending: Family | Admitting: Family

## 2015-09-20 VITALS — BP 118/59 | HR 79 | Wt 249.4 lb

## 2015-09-20 DIAGNOSIS — O3680X0 Pregnancy with inconclusive fetal viability, not applicable or unspecified: Secondary | ICD-10-CM

## 2015-09-20 DIAGNOSIS — Z3490 Encounter for supervision of normal pregnancy, unspecified, unspecified trimester: Secondary | ICD-10-CM | POA: Insufficient documentation

## 2015-09-20 DIAGNOSIS — Z36 Encounter for antenatal screening of mother: Secondary | ICD-10-CM

## 2015-09-20 DIAGNOSIS — Z124 Encounter for screening for malignant neoplasm of cervix: Secondary | ICD-10-CM | POA: Diagnosis not present

## 2015-09-20 DIAGNOSIS — Z3491 Encounter for supervision of normal pregnancy, unspecified, first trimester: Secondary | ICD-10-CM

## 2015-09-20 DIAGNOSIS — Z113 Encounter for screening for infections with a predominantly sexual mode of transmission: Secondary | ICD-10-CM | POA: Diagnosis present

## 2015-09-20 DIAGNOSIS — Z3482 Encounter for supervision of other normal pregnancy, second trimester: Secondary | ICD-10-CM

## 2015-09-20 DIAGNOSIS — Z3492 Encounter for supervision of normal pregnancy, unspecified, second trimester: Secondary | ICD-10-CM

## 2015-09-20 DIAGNOSIS — Z01419 Encounter for gynecological examination (general) (routine) without abnormal findings: Secondary | ICD-10-CM | POA: Insufficient documentation

## 2015-09-20 DIAGNOSIS — O3680X1 Pregnancy with inconclusive fetal viability, fetus 1: Secondary | ICD-10-CM

## 2015-09-20 LAB — POCT URINALYSIS DIP (DEVICE)
BILIRUBIN URINE: NEGATIVE
Glucose, UA: NEGATIVE mg/dL
HGB URINE DIPSTICK: NEGATIVE
Ketones, ur: NEGATIVE mg/dL
NITRITE: NEGATIVE
PH: 7.5 (ref 5.0–8.0)
PROTEIN: NEGATIVE mg/dL
Specific Gravity, Urine: 1.02 (ref 1.005–1.030)
UROBILINOGEN UA: 0.2 mg/dL (ref 0.0–1.0)

## 2015-09-20 NOTE — Addendum Note (Signed)
Addended by: Jill SideAY, Mattie Nordell L on: 09/20/2015 01:08 PM   Modules accepted: Orders

## 2015-09-20 NOTE — Progress Notes (Signed)
Bedside [place] performed for viability = Single IUP;  FHR - 152 bom per PW doppler; FM present

## 2015-09-20 NOTE — Progress Notes (Signed)
Early glucola given due to mother/father diabetics  Initial labs today New ob packet given  First Trimester Screen scheduled for 09/26/15 @ 1400.  Pt notified.

## 2015-09-20 NOTE — Patient Instructions (Signed)

## 2015-09-20 NOTE — Progress Notes (Signed)
  Subjective:    Lisa Brown is a [id number] 6848w4d being seen today for her first obstetrical visit.  Her obstetrical history is significant for 2 TAB and one SAB. Patient does intend to breast feed. Pregnancy history fully reviewed.  Patient reports nausea and vomiting.  Both have improved in the past week.    Vitals:   09/20/15 1002  BP: (!) 118/59  Pulse: 79  Weight: 249 lb 6.4 oz (113.1 kg)    HISTORY: OB History  Gravida Para Term Preterm AB Living  4 0 0 0 3 0  SAB TAB Ectopic Multiple Live Births  1 2 0        # Outcome Date GA Lbr Len/2nd Weight Sex Delivery Anes PTL Lv  4 Current           3 TAB           2 TAB           1 SAB              Past Medical History:  Diagnosis Date  . Asthma    Past Surgical History:  Procedure Laterality Date  . NO PAST SURGERIES     Family History  Problem Relation Age of Onset  . Diabetes Mother   . Diabetes Father   . Kidney disease Father   . Hypertension Father   . Liver disease Father   . Diabetes Maternal Grandmother   . Heart disease Maternal Grandmother   . Diabetes Maternal Grandfather      Exam   BP (!) 118/59   Pulse 79   Wt 249 lb 6.4 oz (113.1 kg)   LMP 06/24/2015 (Exact Date)   BMI 35.79 kg/m  Uterine Size: size equals dates  Pelvic Exam:    Perineum: No Hemorrhoids, Normal Perineum   Vulva: normal   Vagina:  normal mucosa, normal discharge, no palpable nodules   pH: Not done   Cervix: no bleeding following Pap, no cervical motion tenderness and no lesions   Adnexa: normal adnexa and no mass, fullness, tenderness   Bony Pelvis: Adequate  System: Breast:  No nipple retraction or dimpling, No nipple discharge or bleeding, No axillary or supraclavicular adenopathy, Normal to palpation without dominant masses   Skin: normal coloration and turgor, no rashes    Neurologic: negative   Extremities: normal strength, tone, and muscle mass   HEENT neck supple with midline trachea and thyroid without masses    Mouth/Teeth mucous membranes moist, pharynx normal without lesions   Neck supple and no masses   Cardiovascular: regular rate and rhythm, no murmurs or gallops   Respiratory:  appears well, vitals normal, no respiratory distress, acyanotic, normal RR, neck free of mass or lymphadenopathy, chest clear, no wheezing, crepitations, rhonchi, normal symmetric air entry   Abdomen: soft, non-tender; bowel sounds normal; no masses,  no organomegaly   Urinary: urethral meatus normal     Assessment:    Pregnancy: G4P0030 Patient Active Problem List   Diagnosis Date Noted  . Nausea/vomiting in pregnancy 08/16/2015        Plan:     Initial labs drawn.  Pap smear collected. Prenatal vitamins. Problem list reviewed and updated. Genetic Screening discussed First Screen: ordered.  Follow up in 4 weeks.  Lisa EdelsonKARIM, Rolla Servidio N 09/20/2015

## 2015-09-21 LAB — PRENATAL PROFILE (SOLSTAS)
ANTIBODY SCREEN: NEGATIVE
BASOS ABS: 0 {cells}/uL (ref 0–200)
BASOS PCT: 0 %
EOS PCT: 1 %
Eosinophils Absolute: 81 cells/uL (ref 15–500)
HEMATOCRIT: 37.4 % (ref 35.0–45.0)
HEMOGLOBIN: 12.4 g/dL (ref 11.7–15.5)
HIV 1&2 Ab, 4th Generation: NONREACTIVE
Hepatitis B Surface Ag: NEGATIVE
LYMPHS ABS: 2025 {cells}/uL (ref 850–3900)
LYMPHS PCT: 25 %
MCH: 28.4 pg (ref 27.0–33.0)
MCHC: 33.2 g/dL (ref 32.0–36.0)
MCV: 85.6 fL (ref 80.0–100.0)
MPV: 10.4 fL (ref 7.5–12.5)
Monocytes Absolute: 486 cells/uL (ref 200–950)
Monocytes Relative: 6 %
NEUTROS PCT: 68 %
Neutro Abs: 5508 cells/uL (ref 1500–7800)
Platelets: 274 10*3/uL (ref 140–400)
RBC: 4.37 MIL/uL (ref 3.80–5.10)
RDW: 13.8 % (ref 11.0–15.0)
RH TYPE: POSITIVE
Rubella: 1.42 Index — ABNORMAL HIGH (ref <0.90)
WBC: 8.1 10*3/uL (ref 3.8–10.8)

## 2015-09-21 LAB — GC/CHLAMYDIA PROBE AMP (~~LOC~~) NOT AT ARMC
CHLAMYDIA, DNA PROBE: NEGATIVE
Neisseria Gonorrhea: NEGATIVE

## 2015-09-21 LAB — GLUCOSE TOLERANCE, 1 HOUR (50G) W/O FASTING: Glucose, 1 Hr, gestational: 91 mg/dL (ref <140)

## 2015-09-21 LAB — CYTOLOGY - PAP

## 2015-09-22 LAB — HEMOGLOBINOPATHY EVALUATION
HCT: 37.4 % (ref 35.0–45.0)
HGB A2 QUANT: 2.4 % (ref 1.8–3.5)
HGB A: 96.6 % (ref >96.0)
Hemoglobin: 12.4 g/dL (ref 11.7–15.5)
MCH: 28.4 pg (ref 27.0–33.0)
MCV: 85.6 fL (ref 80.0–100.0)
RBC: 4.37 MIL/uL (ref 3.80–5.10)
RDW: 13.8 % (ref 11.0–15.0)

## 2015-09-22 LAB — CULTURE, OB URINE

## 2015-09-23 LAB — PAIN MGMT, PROFILE 6 CONF W/O MM, U
6 Acetylmorphine: NEGATIVE ng/mL (ref <10)
AMPHETAMINES: NEGATIVE ng/mL (ref <500)
Alcohol Metabolites: NEGATIVE ng/mL (ref <500)
BENZODIAZEPINES: NEGATIVE ng/mL (ref <100)
Barbiturates: NEGATIVE ng/mL (ref <300)
CREATININE: 184.8 mg/dL (ref >=20.0)
Cocaine Metabolite: NEGATIVE ng/mL (ref <150)
METHADONE METABOLITE: NEGATIVE ng/mL (ref <100)
Marijuana Metabolite: 262 ng/mL — ABNORMAL HIGH (ref <5)
Marijuana Metabolite: POSITIVE ng/mL — AB (ref <20)
OPIATES: NEGATIVE ng/mL (ref <100)
OXIDANT: NEGATIVE ug/mL (ref <200)
Oxycodone: NEGATIVE ng/mL (ref <100)
PH: 7.23 (ref 4.5–9.0)
Phencyclidine: NEGATIVE ng/mL (ref <25)
Please note:: 0

## 2015-09-26 ENCOUNTER — Ambulatory Visit (HOSPITAL_COMMUNITY)
Admission: RE | Admit: 2015-09-26 | Discharge: 2015-09-26 | Disposition: A | Discharge status: Home / Self Care | Payer: Medicaid Other | Source: Ambulatory Visit | Attending: Family | Admitting: Family

## 2015-09-26 ENCOUNTER — Encounter (HOSPITAL_COMMUNITY): Payer: Self-pay

## 2015-09-26 DIAGNOSIS — Z36 Encounter for antenatal screening of mother: Secondary | ICD-10-CM | POA: Diagnosis not present

## 2015-09-26 DIAGNOSIS — Z3492 Encounter for supervision of normal pregnancy, unspecified, second trimester: Secondary | ICD-10-CM

## 2015-09-26 DIAGNOSIS — Z3A13 13 weeks gestation of pregnancy: Secondary | ICD-10-CM | POA: Diagnosis not present

## 2015-09-26 DIAGNOSIS — O99211 Obesity complicating pregnancy, first trimester: Secondary | ICD-10-CM | POA: Insufficient documentation

## 2015-09-26 DIAGNOSIS — Z3482 Encounter for supervision of other normal pregnancy, second trimester: Secondary | ICD-10-CM

## 2015-09-26 IMAGING — US US MFM FETAL NUCHAL TRANSLUCENCY
1 series · 15 of 28 positions shown · non-contrast
Comparison: none

[Series 1: us mfm fetal nuchal translucency · 38 acquisitions, 15 frames shown]
[im 1/38]
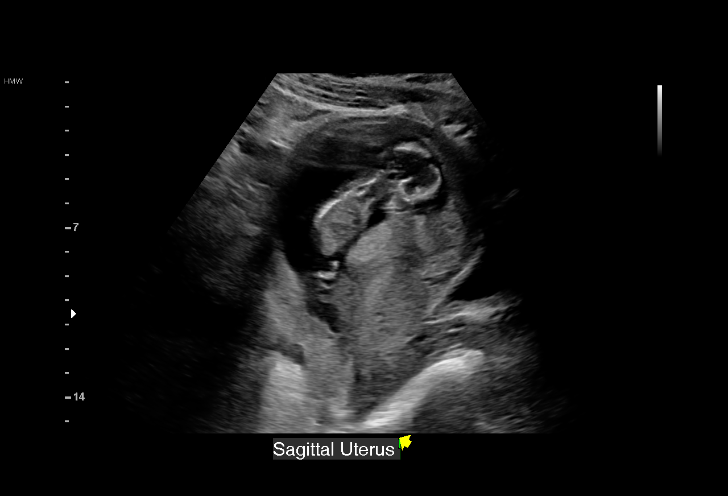
[im 3/38]
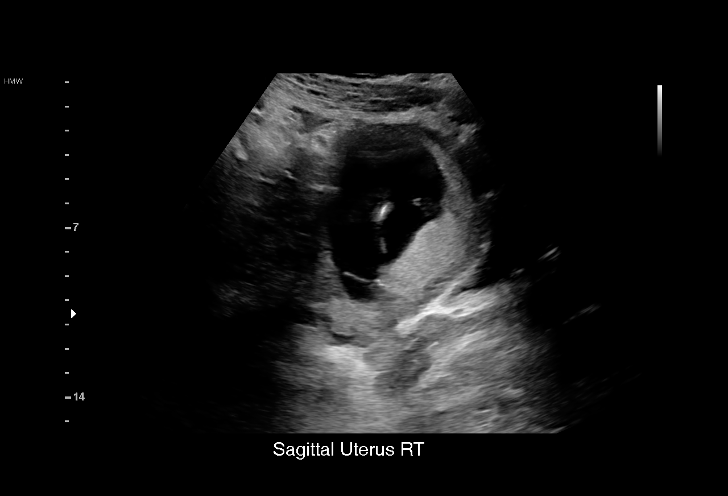
[im 6/38]
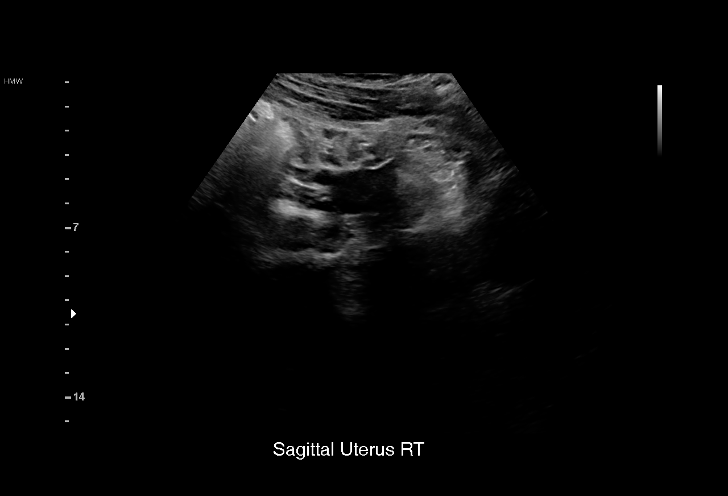
[im 9/38]
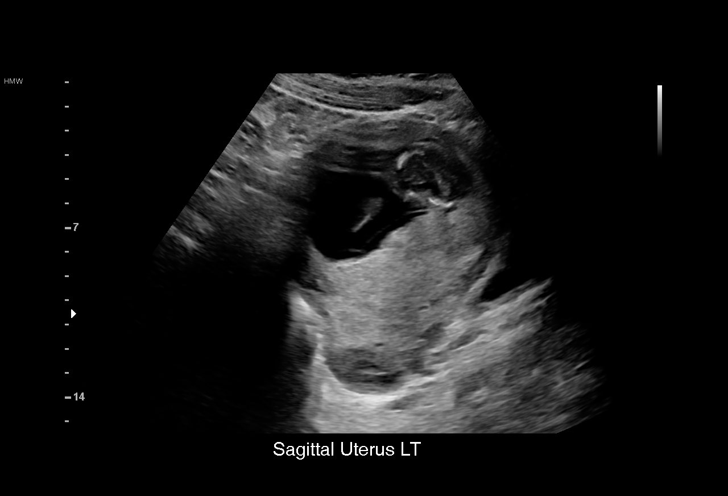
[im 11/38]
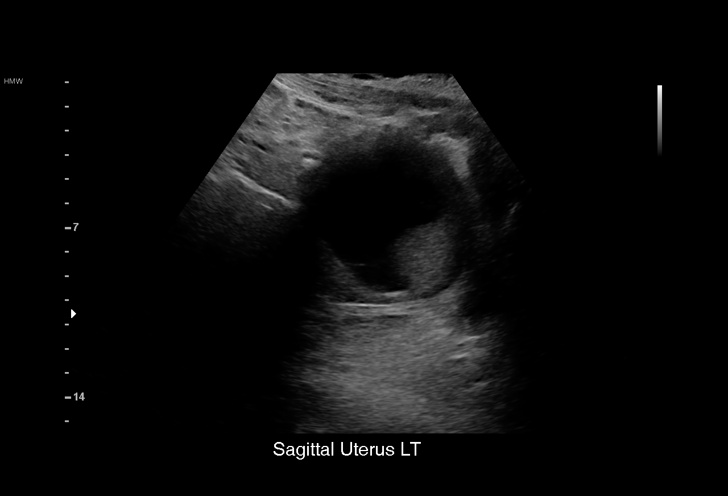
[im 14/38]
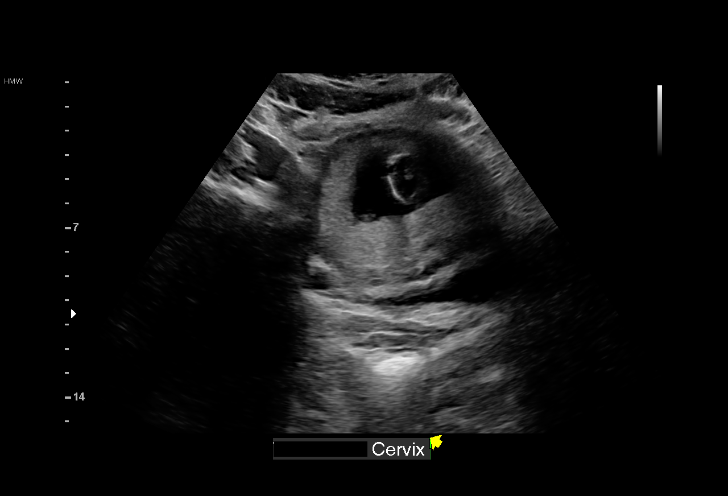
[im 17/38]
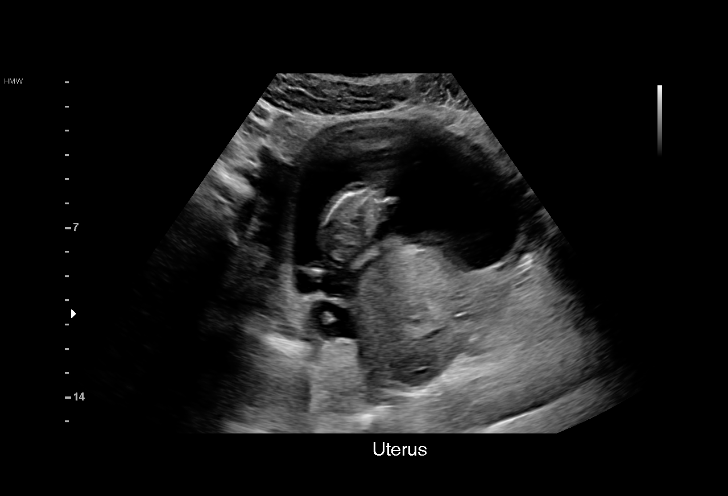
[im 20/38]
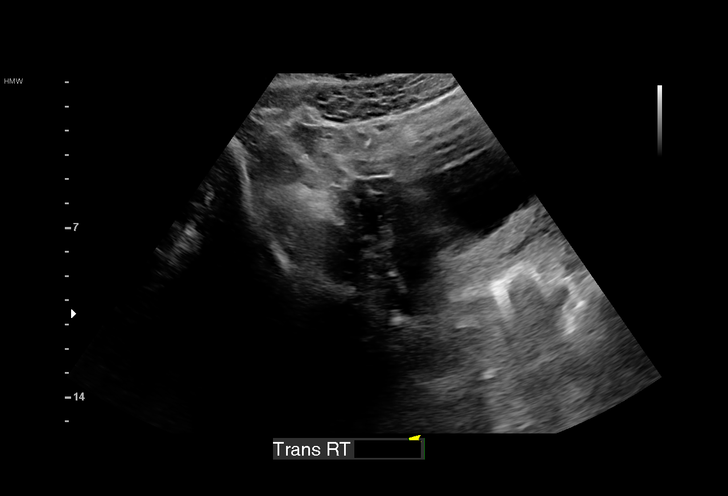
[im 21/38]
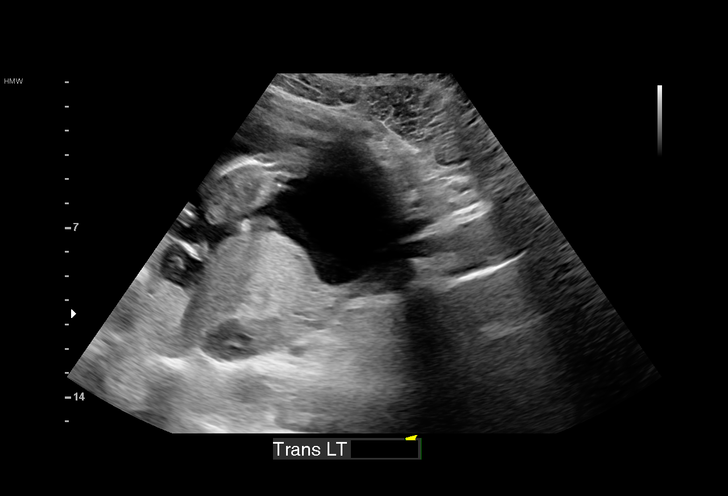
[im 24/38]
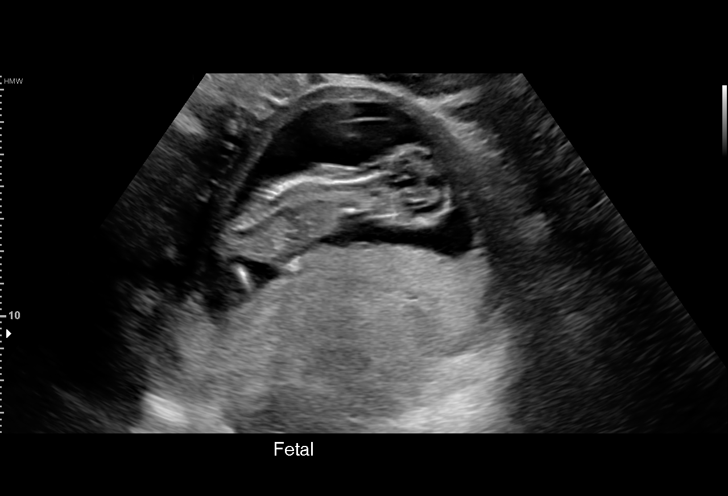
[im 27/38]
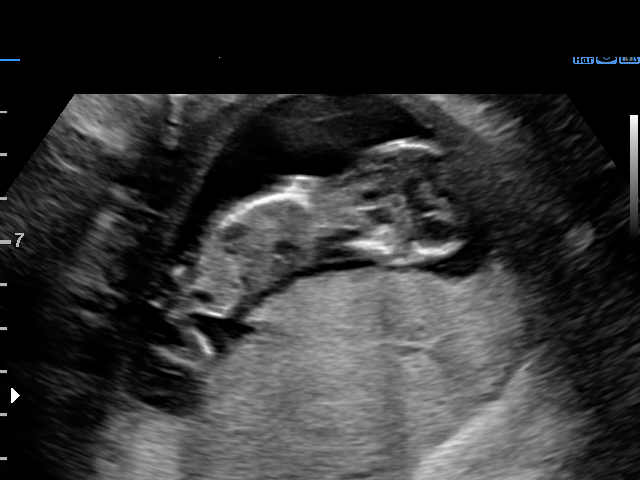
[im 29/38]
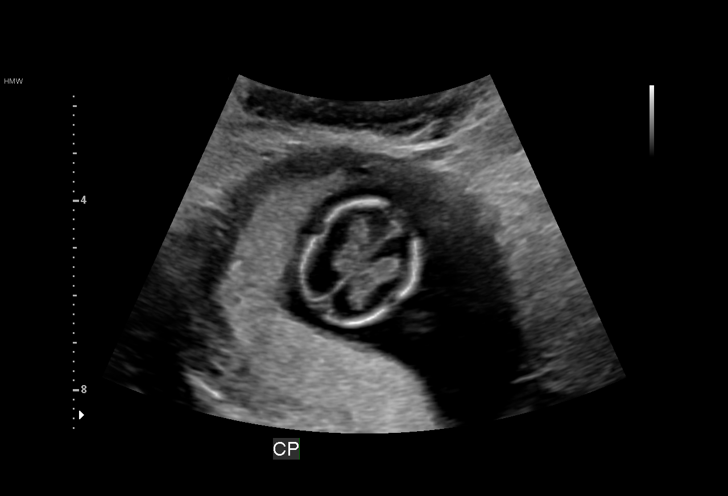
[im 32/38]
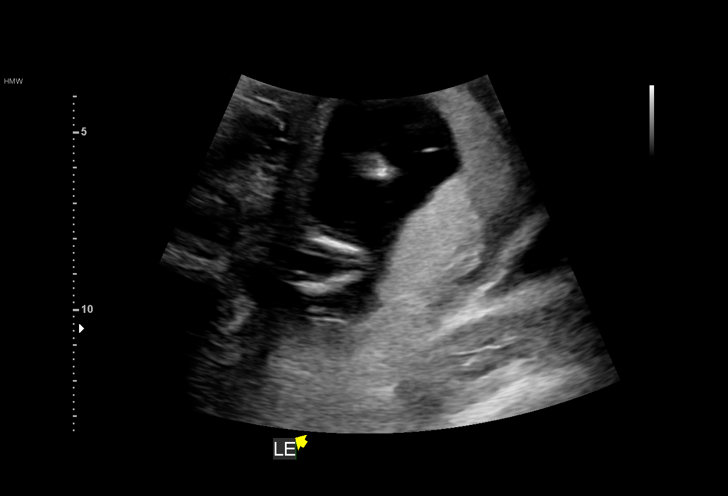
[im 35/38]
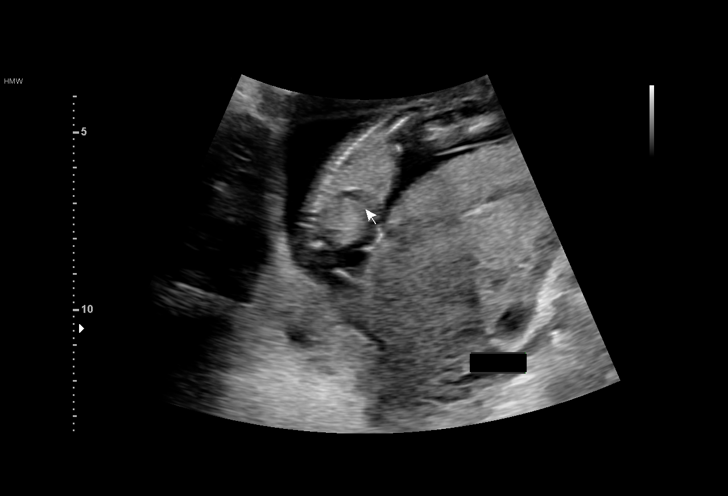
[im 38/38]
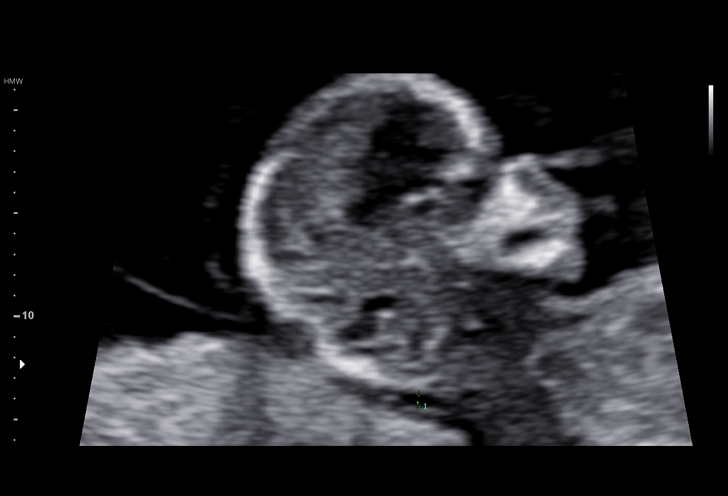

[15 of 28 positions shown; findings below may reference images not displayed]

TRANSLUCENCY

1  ALLIUDDIN            [PHONE_NUMBER]      [PHONE_NUMBER]     [PHONE_NUMBER]
Indications

13 weeks gestation of pregnancy
First trimester aneuploidy screen (NT)         Z36
Obesity complicating pregnancy, first          [8H]
trimester
OB History

Blood Type:            Height:  5'10"  Weight (lb):  249       BMI:
Gravidity:    4          SAB:   1
TOP:          2         Living: 0
Fetal Evaluation

Num Of Fetuses:     1
Preg. Location:     Intrauterine
Gest. Sac:          Intrauterine
Yolk Sac:           Not visualized
Fetal Pole:         Visualized
Fetal Heart         151
Rate(bpm):
Cardiac Activity:   Observed
Placenta:           Posterior

Amniotic Fluid
AFI FV:      Subjectively within normal limits
Biometry

CRL:      77.8  mm     G. Age:  13w 4d                   EDD:   [DATE]
Gestational Age

LMP:           13w 3d        Date:  [DATE]                 EDD:    [DATE]
Best:          13w 3d     Det. By:  LMP  ([DATE])          EDD:    [DATE]
1st Trimester Genetic Sonogram Screening

CRL:            77.8  mm    G. Age:   13w 4d                 EDD:    [DATE]
Nuc Trans:       1.2  mm
Nasal Bone:                 Present
Anatomy

Cranium:               Appears normal         Bladder:                Appears normal
Choroid Plexus:        Appears normal         Upper Extremities:      Visualized
Stomach:               Appears normal, left   Lower Extremities:      Visualized
sided
Cervix Uterus Adnexa

Cervix
Normal appearance by transabdominal scan.

Uterus
No abnormality visualized.

Left Ovary
No adnexal mass visualized. Not visualized.

Right Ovary
No adnexal mass visualized. Not visualized.

Cul De Sac:   No free fluid seen.

Adnexa:       No abnormality visualized.
Impression

SIUP at 13+3 weeks
No gross abnormalities identified
NT measurement was within normal limits for this GA; NB
present
Normal amniotic fluid volume
Measurements consistent with LMP dating
Recommendations

Offer MSAFP in the second trimester for ONTD screening
Offer anatomy U/S by 18 weeks

## 2015-10-04 ENCOUNTER — Other Ambulatory Visit (HOSPITAL_COMMUNITY): Payer: Self-pay

## 2015-10-06 ENCOUNTER — Encounter: Payer: Self-pay | Admitting: Obstetrics and Gynecology

## 2015-10-06 DIAGNOSIS — O99210 Obesity complicating pregnancy, unspecified trimester: Secondary | ICD-10-CM | POA: Insufficient documentation

## 2015-10-06 DIAGNOSIS — Z6836 Body mass index (BMI) 36.0-36.9, adult: Secondary | ICD-10-CM | POA: Insufficient documentation

## 2015-10-06 DIAGNOSIS — F1290 Cannabis use, unspecified, uncomplicated: Secondary | ICD-10-CM | POA: Insufficient documentation

## 2015-10-18 ENCOUNTER — Ambulatory Visit (INDEPENDENT_AMBULATORY_CARE_PROVIDER_SITE_OTHER): Payer: Medicaid Other | Admitting: Obstetrics and Gynecology

## 2015-10-18 VITALS — BP 120/70 | HR 78 | Wt 249.0 lb

## 2015-10-18 DIAGNOSIS — Z3482 Encounter for supervision of other normal pregnancy, second trimester: Secondary | ICD-10-CM

## 2015-10-18 DIAGNOSIS — Z23 Encounter for immunization: Secondary | ICD-10-CM

## 2015-10-18 NOTE — Progress Notes (Signed)
Prenatal Visit Note Date: 10/18/2015 Clinic: [facility]  Subjective:  Lisa Brown is a 25 y.o. G4P0030 at 4668w4d being seen today for ongoing prenatal care.  She is currently monitored for the following issues for this low-risk pregnancy and has Nausea/vomiting in pregnancy; Supervision of normal pregnancy, antepartum; BMI 36.0-36.9,adult; Obesity in pregnancy; and Marijuana use on her problem list.  Patient reports no complaints.      The following portions of the patient's history were reviewed and updated as appropriate: allergies, current medications, past family history, past medical history, past social history, past surgical history and problem list. Problem list updated.  Objective:   Vitals:   10/18/15 1556  BP: 120/70  Pulse: 78  Weight: 249 lb (112.9 kg)    Fetal Status: Fetal Heart Rate (bpm): 143         General:  Alert, oriented and cooperative. Patient is in no acute distress.  Skin: Skin is warm and dry. No rash noted.   Cardiovascular: Normal heart rate noted  Respiratory: Normal respiratory effort, no problems with respiration noted  Abdomen: Soft, gravid, appropriate for gestational age.       Pelvic:  Cervical exam deferred        Extremities: Normal range of motion.     Mental Status: Normal mood and affect. Normal behavior. Normal judgment and thought content.   Urinalysis:      Assessment and Plan:  Pregnancy: G4P0030 at 6268w4d  1. Supervision of normal pregnancy, antepartum, second trimester Routine care. Flu and afp today. Pt scheduled for anatomy scan in 3-4wks.  - US MFM OB COMP + 14 WK; Future - Alpha fetoprotein, maternal  Preterm labor symptoms and general obstetric precautions including but not limited to vaginal bleeding, contractions, leaking of fluid and fetal movement were reviewed in detail with the patient. Please refer to After Visit Summary for other counseling recommendations.  Return in about 4 weeks (around  11/15/2015).    Bingharlie Lilith Solana, MD

## 2015-10-19 LAB — ALPHA FETOPROTEIN, MATERNAL
AFP: 30.3 ng/mL
CURR GEST AGE: 16.6 wk
MoM for AFP: 1.06
OPEN SPINA BIFIDA: NEGATIVE

## 2015-11-06 ENCOUNTER — Ambulatory Visit (HOSPITAL_COMMUNITY)
Admission: RE | Admit: 2015-11-06 | Discharge: 2015-11-06 | Disposition: A | Discharge status: Home / Self Care | Payer: Medicaid Other | Source: Ambulatory Visit | Attending: Obstetrics and Gynecology | Admitting: Obstetrics and Gynecology

## 2015-11-06 ENCOUNTER — Other Ambulatory Visit: Payer: Self-pay | Admitting: Obstetrics and Gynecology

## 2015-11-06 DIAGNOSIS — Z363 Encounter for antenatal screening for malformations: Secondary | ICD-10-CM | POA: Diagnosis not present

## 2015-11-06 DIAGNOSIS — O99212 Obesity complicating pregnancy, second trimester: Secondary | ICD-10-CM | POA: Insufficient documentation

## 2015-11-06 DIAGNOSIS — Z3A19 19 weeks gestation of pregnancy: Secondary | ICD-10-CM | POA: Insufficient documentation

## 2015-11-06 DIAGNOSIS — Z3492 Encounter for supervision of normal pregnancy, unspecified, second trimester: Secondary | ICD-10-CM

## 2015-11-06 DIAGNOSIS — F1210 Cannabis abuse, uncomplicated: Secondary | ICD-10-CM

## 2015-11-06 IMAGING — US US MFM OB COMP +14 WKS
1 series · 14 of 28 positions shown · non-contrast
Comparison: none

[Series 1: us mfm ob comp +14 wks · 14 of 116 slices shown]
[im 5/116]
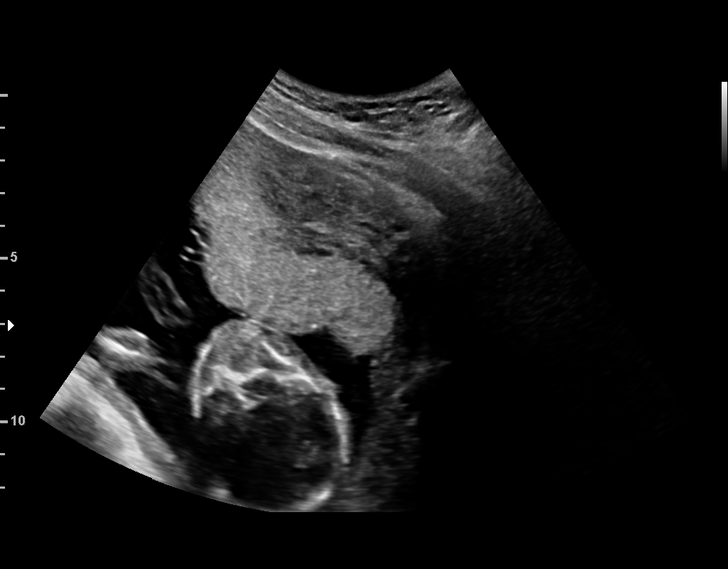
[im 13/116]
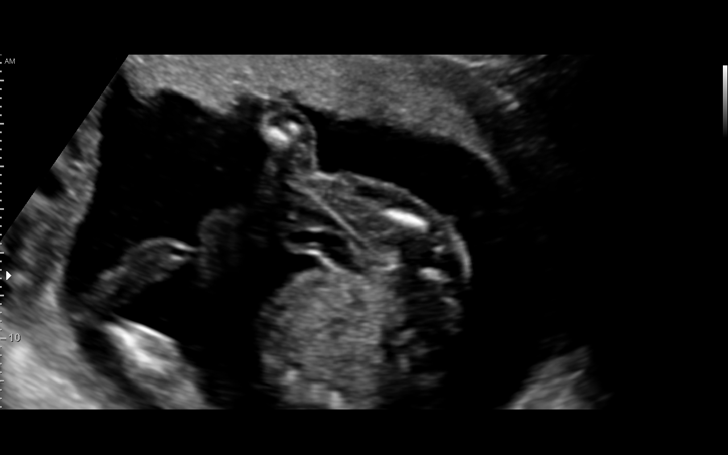
[im 22/116]
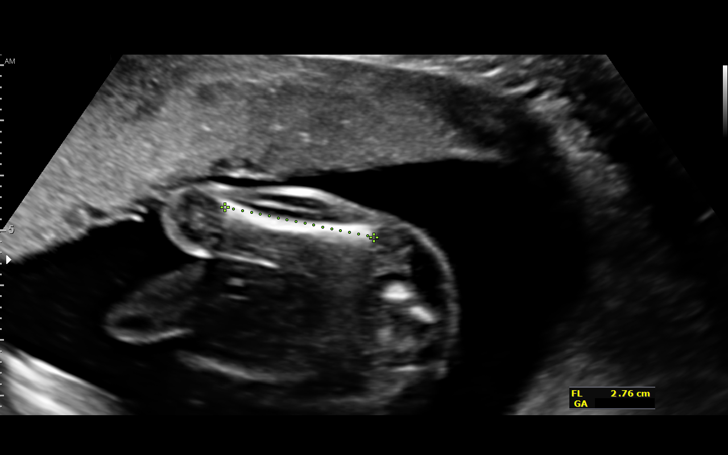
[im 30/116]
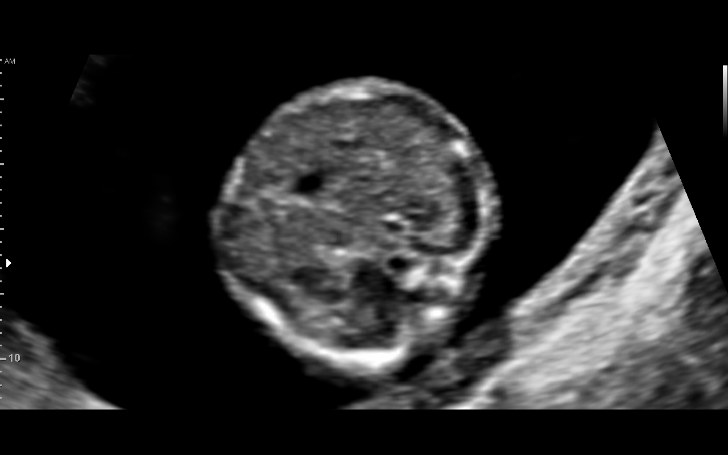
[im 39/116]
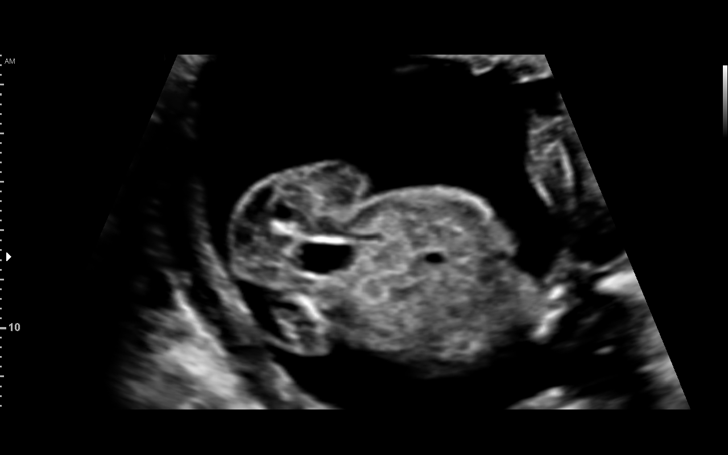
[im 47/116]
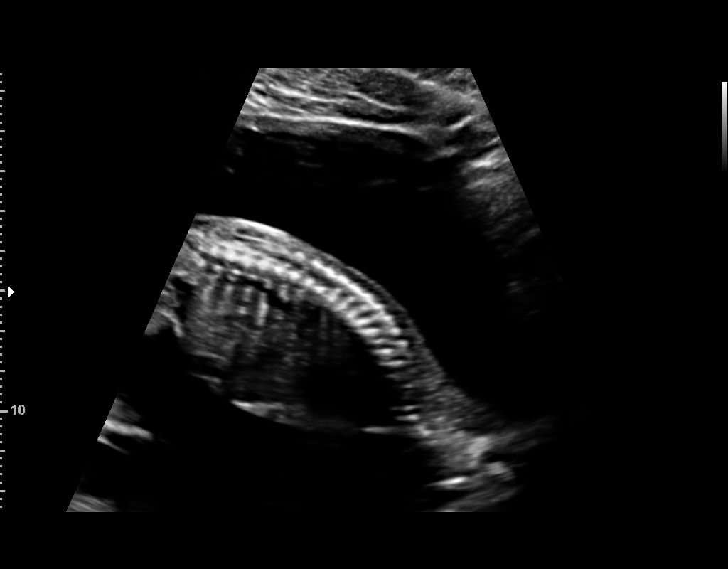
[im 56/116]
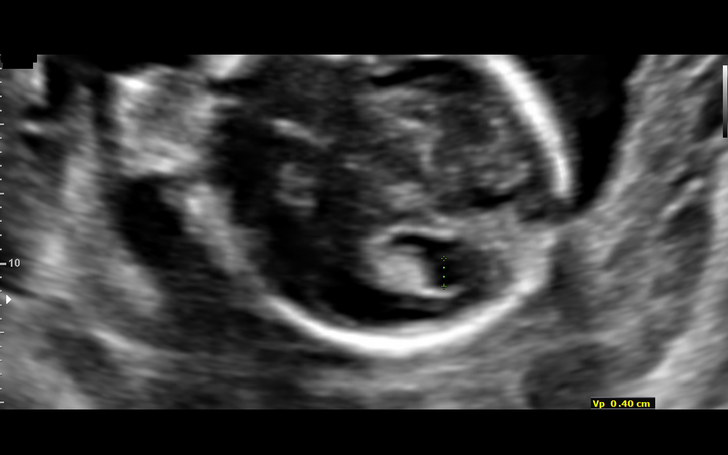
[im 64/116]
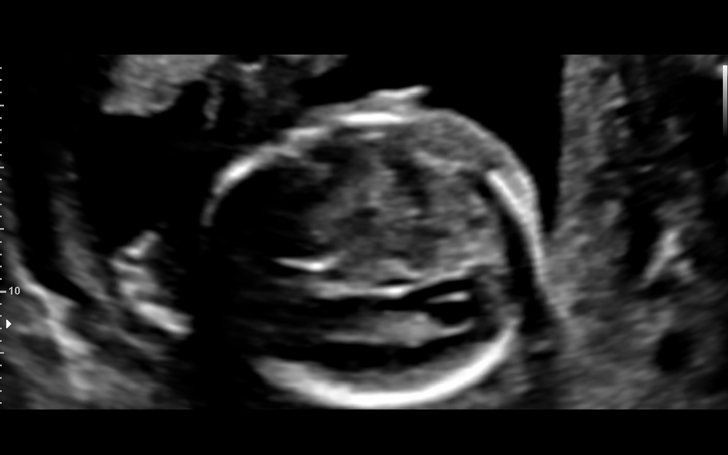
[im 73/116]
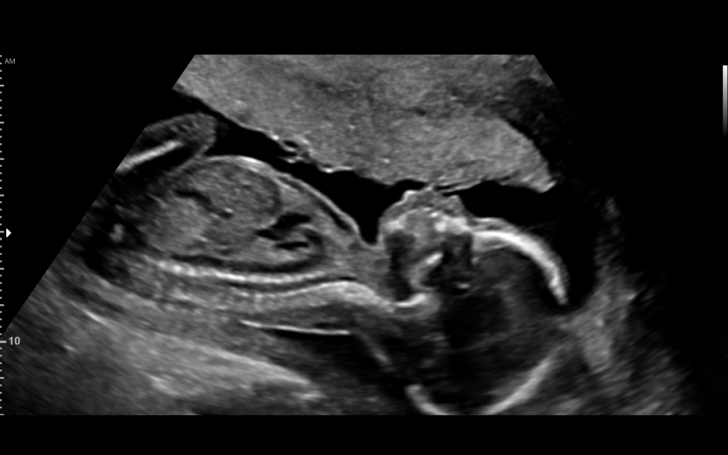
[im 81/116]
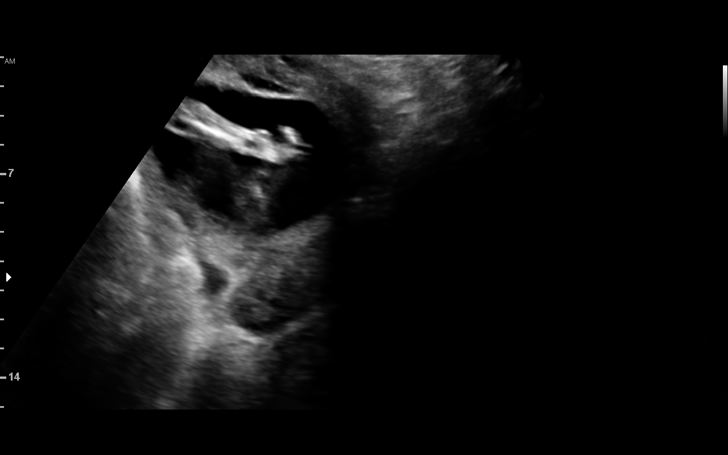
[im 90/116]
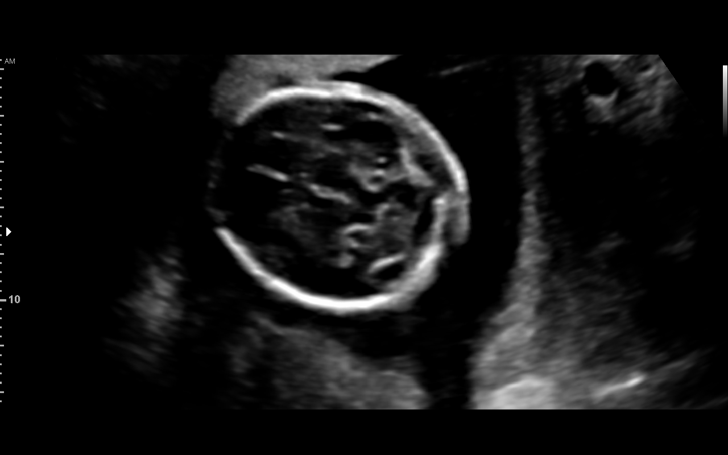
[im 98/116]
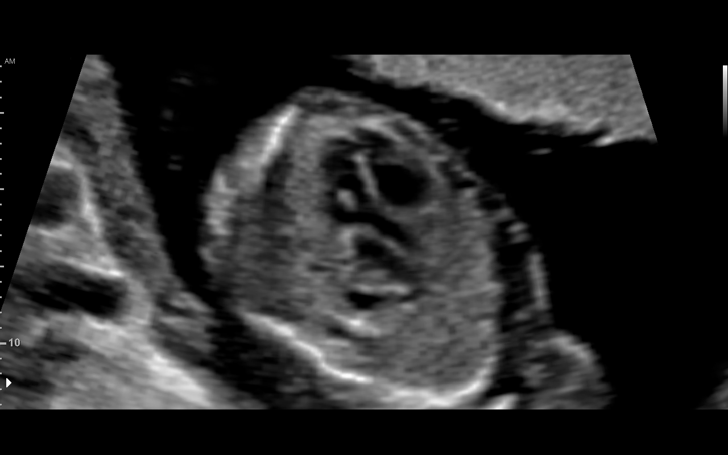
[im 107/116]
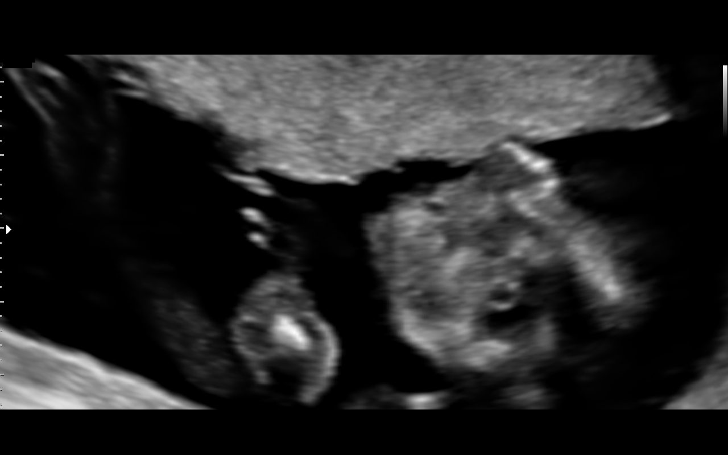
[im 116/116]
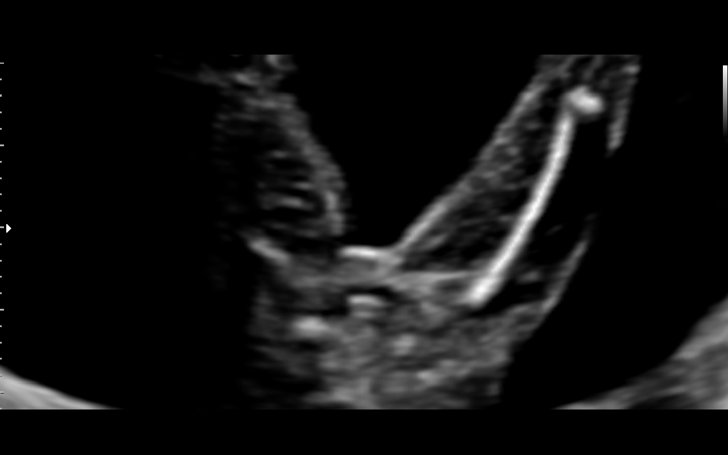

[14 of 28 positions shown; findings below may reference images not displayed]

1  [facility]          [PHONE_NUMBER]      [PHONE_NUMBER]     [PHONE_NUMBER]
Indications

19 weeks gestation of pregnancy
Obesity complicating pregnancy, second         [NU]
trimester
Marijuana Abuse
Encounter for antenatal screening for          [NU]
malformations
OB History

Blood Type:            Height:  5'10"  Weight (lb):  249       BMI:
Gravidity:    4          SAB:   1
TOP:          2         Living: 0
Fetal Evaluation

Num Of Fetuses:     1
Fetal Heart         149
Rate(bpm):
Cardiac Activity:   Observed
Presentation:       Variable
Placenta:           Anterior, above cervical os
P. Cord Insertion:  Visualized, central

Amniotic Fluid
AFI FV:      Subjectively within normal limits

Largest Pocket(cm)
7.96
Biometry
BPD:      45.6  mm     G. Age:  19w 5d         72  %    CI:         74.11  %    70 - 86
FL/HC:       19.5  %    16.1 -
HC:      168.2  mm     G. Age:  19w 4d         52  %    HC/AC:       1.18       1.09 -
AC:      143.1  mm     G. Age:  19w 4d         58  %    FL/BPD:      71.9  %
FL:       32.8  mm     G. Age:  20w 2d         76  %    FL/AC:       22.9  %    20 - 24
CER:      21.1  mm     G. Age:  20w 1d         66  %
NFT:       5.3  mm
CM:        3.1  mm

Est. FW:     319   gm    0 lb 11 oz     55  %
Gestational Age

LMP:           19w 2d        Date:  [DATE]                 EDD:    [DATE]
U/S Today:     19w 6d                                        EDD:    [DATE]
Best:          19w 2d     Det. By:  LMP  ([DATE])          EDD:    [DATE]
Anatomy

Cranium:               Appears normal         Aortic Arch:            Appears normal
Cavum:                 Appears normal         Ductal Arch:            Appears normal
Ventricles:            Appears normal         Diaphragm:              Appears normal
Choroid Plexus:        Appears normal         Stomach:                Appears normal, left
sided
Cerebellum:            Appears normal         Abdomen:                Appears normal
Posterior Fossa:       Appears normal         Abdominal Wall:         Appears nml (cord
insert, abd wall)
Nuchal Fold:           Appears normal         Cord Vessels:           Appears normal (3
vessel cord)
Face:                  Appears normal         Kidneys:                Appear normal
(orbits and profile)
Lips:                  Appears normal         Bladder:                Appears normal
Thoracic:              Appears normal         Spine:                  Ltd views no
intracranial signs of
NT
Heart:                 Appears normal; EIF    Upper Extremities:      Appears normal
RVOT:                  Appears normal         Lower Extremities:      Appears normal
LVOT:                  Appears normal

Other:  Parents do not wish to know sex of fetus. Fetus appears to be a
female. Nasal bone visualized. Technically difficult due to maternal
habitus and fetal position.
Cervix Uterus Adnexa

Cervix
Length:           3.11  cm.
Normal appearance by transabdominal scan.

Uterus
No abnormality visualized.

Left Ovary
Not visualized.

Right Ovary
Within normal limits.

Adnexa:       No abnormality visualized.
Impression

SIUP at 19+2 weeks
Normal detailed fetal anatomy; limited views of spine
Markers of aneuploidy: none
Normal amniotic fluid volume
Measurements consistent with LMP dating
Recommendations

Follow-up as clinically indicated

## 2015-11-20 ENCOUNTER — Ambulatory Visit (INDEPENDENT_AMBULATORY_CARE_PROVIDER_SITE_OTHER): Payer: Medicaid Other | Admitting: Obstetrics and Gynecology

## 2015-11-20 VITALS — BP 114/71 | HR 78 | Wt 250.4 lb

## 2015-11-20 DIAGNOSIS — Z3482 Encounter for supervision of other normal pregnancy, second trimester: Secondary | ICD-10-CM | POA: Diagnosis not present

## 2015-11-20 DIAGNOSIS — Z3480 Encounter for supervision of other normal pregnancy, unspecified trimester: Secondary | ICD-10-CM

## 2015-11-20 NOTE — Progress Notes (Signed)
Home Medicaid Form Completed  

## 2015-11-20 NOTE — Progress Notes (Signed)
Subjective:  Lisa Brown is a 25 y.o. G4P0030 at 2083w2d being seen today for ongoing prenatal care.  She is currently monitored for the following issues for this low-risk pregnancy and has Nausea/vomiting in pregnancy; Supervision of normal pregnancy, antepartum; BMI 36.0-36.9,adult; Obesity in pregnancy; and Marijuana use on her problem list.  Patient reports no complaints.  Contractions: Not present. Vag. Bleeding: None.  Movement: Present. Denies leaking of fluid.   The following portions of the patient's history were reviewed and updated as appropriate: allergies, current medications, past family history, past medical history, past social history, past surgical history and problem list. Problem list updated.  Objective:   Vitals:   11/20/15 1302  BP: 114/71  Pulse: 78  Weight: 250 lb 6.4 oz (113.6 kg)    Fetal Status: Fetal Heart Rate (bpm): 147   Movement: Present     General:  Alert, oriented and cooperative. Patient is in no acute distress.  Skin: Skin is warm and dry. No rash noted.   Cardiovascular: Normal heart rate noted  Respiratory: Normal respiratory effort, no problems with respiration noted  Abdomen: Soft, gravid, appropriate for gestational age. Pain/Pressure: Present     Pelvic:  Cervical exam deferred        Extremities: Normal range of motion.  Edema: None  Mental Status: Normal mood and affect. Normal behavior. Normal judgment and thought content.   Urinalysis:      Assessment and Plan:  Pregnancy: G4P0030 at 4383w2d  1. Supervision of other normal pregnancy, antepartum No problems  Preterm labor symptoms and general obstetric precautions including but not limited to vaginal bleeding, contractions, leaking of fluid and fetal movement were reviewed in detail with the patient. Please refer to After Visit Summary for other counseling recommendations.  Return in about 4 weeks (around 12/18/2015).   Hermina StaggersMichael L Turkessa Ostrom, MD

## 2015-12-18 ENCOUNTER — Ambulatory Visit (INDEPENDENT_AMBULATORY_CARE_PROVIDER_SITE_OTHER): Payer: Medicaid Other | Admitting: Obstetrics & Gynecology

## 2015-12-18 VITALS — BP 111/62 | HR 79 | Wt 258.0 lb

## 2015-12-18 DIAGNOSIS — Z3480 Encounter for supervision of other normal pregnancy, unspecified trimester: Secondary | ICD-10-CM

## 2015-12-18 DIAGNOSIS — Z3482 Encounter for supervision of other normal pregnancy, second trimester: Secondary | ICD-10-CM

## 2015-12-18 NOTE — Progress Notes (Signed)
US result reviewed   PRENATAL VISIT NOTE  Subjective:  Lisa Brown is a 25 y.o. G4P0030 at 8186w2d being seen today for ongoing prenatal care.  She is currently monitored for the following issues for this low-risk pregnancy and has Nausea/vomiting in pregnancy; Supervision of normal pregnancy, antepartum; BMI 36.0-36.9,adult; Obesity in pregnancy; and Marijuana use on her problem list.  Patient reports no complaints.  Contractions: Not present. Vag. Bleeding: None.  Movement: Present. Denies leaking of fluid.   The following portions of the patient's history were reviewed and updated as appropriate: allergies, current medications, past family history, past medical history, past social history, past surgical history and problem list. Problem list updated.  Objective:   Vitals:   12/18/15 1438  BP: 111/62  Pulse: 79  Weight: 258 lb (117 kg)    Fetal Status: Fetal Heart Rate (bpm): 144   Movement: Present     General:  Alert, oriented and cooperative. Patient is in no acute distress.  Skin: Skin is warm and dry. No rash noted.   Cardiovascular: Normal heart rate noted  Respiratory: Normal respiratory effort, no problems with respiration noted  Abdomen: Soft, gravid, appropriate for gestational age. Pain/Pressure: Present     Pelvic:  Cervical exam deferred        Extremities: Normal range of motion.  Edema: None  Mental Status: Normal mood and affect. Normal behavior. Normal judgment and thought content.   Assessment and Plan:  Pregnancy: G4P0030 at 3186w2d  1. Supervision of other normal pregnancy, antepartum Normal screening so far  Preterm labor symptoms and general obstetric precautions including but not limited to vaginal bleeding, contractions, leaking of fluid and fetal movement were reviewed in detail with the patient. Please refer to After Visit Summary for other counseling recommendations.  Return in about 3 weeks (around 01/08/2016) for 2 hr GTT.   Adam PhenixJames G Arnold,  MD

## 2015-12-18 NOTE — Patient Instructions (Signed)
Second Trimester of Pregnancy The second trimester is from week 13 through week 28 (months 4 through 6). The second trimester is often a time when you feel your best. Your body has also adjusted to being pregnant, and you begin to feel better physically. Usually, morning sickness has lessened or quit completely, you may have more energy, and you may have an increase in appetite. The second trimester is also a time when the fetus is growing rapidly. At the end of the sixth month, the fetus is about 9 inches long and weighs about 1 pounds. You will likely begin to feel the baby move (quickening) between 18 and 20 weeks of the pregnancy. Body changes during your second trimester Your body continues to go through many changes during your second trimester. The changes vary from woman to woman.  Your weight will continue to increase. You will notice your lower abdomen bulging out.  You may begin to get stretch marks on your hips, abdomen, and breasts.  You may develop headaches that can be relieved by medicines. The medicines should be approved by your health care provider.  You may urinate more often because the fetus is pressing on your bladder.  You may develop or continue to have heartburn as a result of your pregnancy.  You may develop constipation because certain hormones are causing the muscles that push waste through your intestines to slow down.  You may develop hemorrhoids or swollen, bulging veins (varicose veins).  You may have back pain. This is caused by:  Weight gain.  Pregnancy hormones that are relaxing the joints in your pelvis.  A shift in weight and the muscles that support your balance.  Your breasts will continue to grow and they will continue to become tender.  Your gums may bleed and may be sensitive to brushing and flossing.  Dark spots or blotches (chloasma, mask of pregnancy) may develop on your face. This will likely fade after the baby is born.  A dark line  from your belly button to the pubic area (linea nigra) may appear. This will likely fade after the baby is born.  You may have changes in your hair. These can include thickening of your hair, rapid growth, and changes in texture. Some women also have hair loss during or after pregnancy, or hair that feels dry or thin. Your hair will most likely return to normal after your baby is born. What to expect at prenatal visits During a routine prenatal visit:  You will be weighed to make sure you and the fetus are growing normally.  Your blood pressure will be taken.  Your abdomen will be measured to track your baby's growth.  The fetal heartbeat will be listened to.  Any test results from the previous visit will be discussed. Your health care provider may ask you:  How you are feeling.  If you are feeling the baby move.  If you have had any abnormal symptoms, such as leaking fluid, bleeding, severe headaches, or abdominal cramping.  If you are using any tobacco products, including cigarettes, chewing tobacco, and electronic cigarettes.  If you have any questions. Other tests that may be performed during your second trimester include:  Blood tests that check for:  Low iron levels (anemia).  Gestational diabetes (between 24 and 28 weeks).  Rh antibodies. This is to check for a protein on red blood cells (Rh factor).  Urine tests to check for infections, diabetes, or protein in the urine.  An ultrasound to   confirm the proper growth and development of the baby.  An amniocentesis to check for possible genetic problems.  Fetal screens for spina bifida and Down syndrome.  HIV (human immunodeficiency virus) testing. Routine prenatal testing includes screening for HIV, unless you choose not to have this test. Follow these instructions at home: Eating and drinking  Continue to eat regular, healthy meals.  Avoid raw meat, uncooked cheese, cat litter boxes, and soil used by cats. These  carry germs that can cause birth defects in the baby.  Take your prenatal vitamins.  Take 1500-2000 mg of calcium daily starting at the 20th week of pregnancy until you deliver your baby.  If you develop constipation:  Take over-the-counter or prescription medicines.  Drink enough fluid to keep your urine clear or pale yellow.  Eat foods that are high in fiber, such as fresh fruits and vegetables, whole grains, and beans.  Limit foods that are high in fat and processed sugars, such as fried and sweet foods. Activity  Exercise only as directed by your health care provider. Experiencing uterine cramps is a good sign to stop exercising.  Avoid heavy lifting, wear low heel shoes, and practice good posture.  Wear your seat belt at all times when driving.  Rest with your legs elevated if you have leg cramps or low back pain.  Wear a good support bra for breast tenderness.  Do not use hot tubs, steam rooms, or saunas. Lifestyle  Avoid all smoking, herbs, alcohol, and unprescribed drugs. These chemicals affect the formation and growth of the baby.  Do not use any products that contain nicotine or tobacco, such as cigarettes and e-cigarettes. If you need help quitting, ask your health care provider.  A sexual relationship may be continued unless your health care provider directs you otherwise. General instructions  Follow your health care provider's instructions regarding medicine use. There are medicines that are either safe or unsafe to take during pregnancy.  Take warm sitz baths to soothe any pain or discomfort caused by hemorrhoids. Use hemorrhoid cream if your health care provider approves.  If you develop varicose veins, wear support hose. Elevate your feet for 15 minutes, 3-4 times a day. Limit salt in your diet.  Visit your dentist if you have not gone yet during your pregnancy. Use a soft toothbrush to brush your teeth and be gentle when you floss.  Keep all follow-up  prenatal visits as told by your health care provider. This is important. Contact a health care provider if:  You have dizziness.  You have mild pelvic cramps, pelvic pressure, or nagging pain in the abdominal area.  You have persistent nausea, vomiting, or diarrhea.  You have a bad smelling vaginal discharge.  You have pain with urination. Get help right away if:  You have a fever.  You are leaking fluid from your vagina.  You have spotting or bleeding from your vagina.  You have severe abdominal cramping or pain.  You have rapid weight gain or weight loss.  You have shortness of breath with chest pain.  You notice sudden or extreme swelling of your face, hands, ankles, feet, or legs.  You have not felt your baby move in over an hour.  You have severe headaches that do not go away with medicine.  You have vision changes. Summary  The second trimester is from week 13 through week 28 (months 4 through 6). It is also a time when the fetus is growing rapidly.  Your body goes   through many changes during pregnancy. The changes vary from woman to woman.  Avoid all smoking, herbs, alcohol, and unprescribed drugs. These chemicals affect the formation and growth your baby.  Do not use any tobacco products, such as cigarettes, chewing tobacco, and e-cigarettes. If you need help quitting, ask your health care provider.  Contact your health care provider if you have any questions. Keep all prenatal visits as told by your health care provider. This is important. This information is not intended to replace advice given to you by your health care provider. Make sure you discuss any questions you have with your health care provider. Document Released: 01/01/2001 Document Revised: 06/15/2015 Document Reviewed: 03/10/2012 [employer] Interactive Patient Education  2017 [employer]  

## 2015-12-21 ENCOUNTER — Encounter (HOSPITAL_COMMUNITY): Payer: Self-pay | Admitting: *Deleted

## 2015-12-21 ENCOUNTER — Inpatient Hospital Stay (HOSPITAL_COMMUNITY)
Admission: AD | Admit: 2015-12-21 | Discharge: 2015-12-21 | Disposition: A | Discharge status: Home / Self Care | Payer: Medicaid Other | Source: Ambulatory Visit | Attending: Obstetrics and Gynecology | Admitting: Obstetrics and Gynecology

## 2015-12-21 DIAGNOSIS — Z3480 Encounter for supervision of other normal pregnancy, unspecified trimester: Secondary | ICD-10-CM

## 2015-12-21 DIAGNOSIS — O219 Vomiting of pregnancy, unspecified: Secondary | ICD-10-CM

## 2015-12-21 DIAGNOSIS — Z3A25 25 weeks gestation of pregnancy: Secondary | ICD-10-CM | POA: Diagnosis not present

## 2015-12-21 DIAGNOSIS — N3000 Acute cystitis without hematuria: Secondary | ICD-10-CM

## 2015-12-21 DIAGNOSIS — O210 Mild hyperemesis gravidarum: Secondary | ICD-10-CM | POA: Diagnosis not present

## 2015-12-21 DIAGNOSIS — B9689 Other specified bacterial agents as the cause of diseases classified elsewhere: Secondary | ICD-10-CM | POA: Insufficient documentation

## 2015-12-21 DIAGNOSIS — N760 Acute vaginitis: Secondary | ICD-10-CM | POA: Insufficient documentation

## 2015-12-21 DIAGNOSIS — O2342 Unspecified infection of urinary tract in pregnancy, second trimester: Secondary | ICD-10-CM | POA: Diagnosis not present

## 2015-12-21 DIAGNOSIS — O23592 Infection of other part of genital tract in pregnancy, second trimester: Secondary | ICD-10-CM | POA: Diagnosis not present

## 2015-12-21 LAB — WET PREP, GENITAL
Sperm: NONE SEEN
TRICH WET PREP: NONE SEEN
Yeast Wet Prep HPF POC: NONE SEEN

## 2015-12-21 LAB — COMPREHENSIVE METABOLIC PANEL
ALK PHOS: 44 U/L (ref 38–126)
ALT: 14 U/L (ref 14–54)
ANION GAP: 6 (ref 5–15)
AST: 17 U/L (ref 15–41)
Albumin: 3.5 g/dL (ref 3.5–5.0)
BILIRUBIN TOTAL: 0.3 mg/dL (ref 0.3–1.2)
BUN: 9 mg/dL (ref 6–20)
CALCIUM: 9.3 mg/dL (ref 8.9–10.3)
CO2: 24 mmol/L (ref 22–32)
CREATININE: 0.55 mg/dL (ref 0.44–1.00)
Chloride: 105 mmol/L (ref 101–111)
GFR calc non Af Amer: >60 mL/min (ref >60)
GLUCOSE: 110 mg/dL — AB (ref 65–99)
Potassium: 4.2 mmol/L (ref 3.5–5.1)
Sodium: 135 mmol/L (ref 135–145)
TOTAL PROTEIN: 7.2 g/dL (ref 6.5–8.1)

## 2015-12-21 LAB — CBC WITH DIFFERENTIAL/PLATELET
Basophils Absolute: 0 10*3/uL (ref 0.0–0.1)
Basophils Relative: 0 %
Eosinophils Absolute: 0.1 10*3/uL (ref 0.0–0.7)
Eosinophils Relative: 1 %
HEMATOCRIT: 33.4 % — AB (ref 36.0–46.0)
HEMOGLOBIN: 11.6 g/dL — AB (ref 12.0–15.0)
LYMPHS ABS: 2.1 10*3/uL (ref 0.7–4.0)
LYMPHS PCT: 27 %
MCH: 28.4 pg (ref 26.0–34.0)
MCHC: 34.7 g/dL (ref 30.0–36.0)
MCV: 81.7 fL (ref 78.0–100.0)
MONOS PCT: 3 %
Monocytes Absolute: 0.2 10*3/uL (ref 0.1–1.0)
NEUTROS ABS: 5.4 10*3/uL (ref 1.7–7.7)
NEUTROS PCT: 69 %
Platelets: 252 10*3/uL (ref 150–400)
RBC: 4.09 MIL/uL (ref 3.87–5.11)
RDW: 13.8 % (ref 11.5–15.5)
WBC: 7.9 10*3/uL (ref 4.0–10.5)

## 2015-12-21 LAB — URINALYSIS, ROUTINE W REFLEX MICROSCOPIC
BILIRUBIN URINE: NEGATIVE
GLUCOSE, UA: NEGATIVE mg/dL
KETONES UR: 15 mg/dL — AB
NITRITE: POSITIVE — AB
PH: 6.5 (ref 5.0–8.0)
Protein, ur: NEGATIVE mg/dL
Specific Gravity, Urine: 1.02 (ref 1.005–1.030)

## 2015-12-21 LAB — URINE MICROSCOPIC-ADD ON

## 2015-12-21 MED ORDER — PROMETHAZINE HCL 12.5 MG PO TABS: 12.5000 mg | ORAL_TABLET | Freq: Four times a day (QID) | ORAL | 0 refills | Status: DC | PRN

## 2015-12-21 MED ORDER — PROMETHAZINE HCL 25 MG/ML IJ SOLN
12.5000 mg | Freq: Four times a day (QID) | INTRAMUSCULAR | Status: DC | PRN
  Administered 2015-12-21: 12.5 mg via INTRAVENOUS
  Filled 2015-12-21: qty 1

## 2015-12-21 MED ORDER — NITROFURANTOIN MONOHYD MACRO 100 MG PO CAPS: 100.0000 mg | ORAL_CAPSULE | Freq: Two times a day (BID) | ORAL | 0 refills | Status: AC

## 2015-12-21 MED ORDER — LACTATED RINGERS IV BOLUS (SEPSIS)
1000.0000 mL | Freq: Once | INTRAVENOUS | Status: AC
  Administered 2015-12-21: 1000 mL via INTRAVENOUS

## 2015-12-21 MED ORDER — METRONIDAZOLE 500 MG PO TABS: 500.0000 mg | ORAL_TABLET | Freq: Two times a day (BID) | ORAL | 0 refills | Status: AC

## 2015-12-21 NOTE — MAU Provider Note (Signed)
History     CSN: [id number]  Arrival date and time: 12/21/15 1549   First Provider Initiated Contact with Patient 12/21/15 1716      Chief Complaint  Patient presents with  . Emesis   25 yo AAF G4P0030 at 25wk5dGA dated by LMP who presents with 2 day hx of nausea and vomiting that became worse this morning. Patient reports being at home and immediately becoming nauseous to the point where she was unable to tolerate any solids or liquids without vomiting. Admits to having some mild lightheadedness, urinary frequency, and intermittent discharge with a strong odor. Denies any recent sick contacts,consumption of raw or undercooked foods,fevers,diarrhea,abdominal pain,flank pain,suprapubic pain,dysuria,vaginal irritation,or pain. Reports good fetal movement. Denies VB or leakage of fluid. [facility]. Pt most recent appt was on Monday. She recently was diagnosed with BV and states she completed the full antibiotic course but did develop a yeast infection following the antibiotic course. Patient denies recent episodes of sexual intercourse.    OB History    Gravida Para Term Preterm AB Living   4 0 0 0 3 0   SAB TAB Ectopic Multiple Live Births   1 2 0          Past Medical History:  Diagnosis Date  . Asthma     Past Surgical History:  Procedure Laterality Date  . NO PAST SURGERIES      Family History  Problem Relation Age of Onset  . Diabetes Mother   . Diabetes Father   . Kidney disease Father   . Hypertension Father   . Liver disease Father   . Diabetes Maternal Grandmother   . Heart disease Maternal Grandmother   . Diabetes Maternal Grandfather     Social History  Substance Use Topics  . Smoking status: Never Smoker  . Smokeless tobacco: Never Used  . Alcohol use No    Allergies: No Known Allergies  Prescriptions Prior to Admission  Medication Sig Dispense Refill Last Dose  . Prenatal Vit-Fe Fumarate-FA (PRENATAL MULTIVITAMIN)  TABS tablet Take 1 tablet by mouth daily.    12/20/2015 at Unknown time    Review of Systems  Constitutional: Negative for chills and fever.       Lightheadedness  HENT: Negative.   Eyes: Negative.   Respiratory: Negative.   Cardiovascular: Negative.   Gastrointestinal: Positive for constipation, nausea and vomiting. Negative for abdominal pain and diarrhea.  Genitourinary: Positive for flank pain, frequency and hematuria. Negative for dysuria.  Skin: Positive for rash. Negative for itching.  Neurological: Negative.   Endo/Heme/Allergies: Negative.   Psychiatric/Behavioral: Negative.    Physical Exam   Blood pressure 104/61, pulse 89, temperature 98.1 F (36.7 C), temperature source Oral, resp. rate 18, height 5\' 9"  (1.753 m), weight 256 lb (116.1 kg), last menstrual period 06/24/2015.  Physical Exam  Constitutional: She appears well-developed.  HENT:  Mucous membranes dry  Eyes: Pupils are equal, round, and reactive to light.  Neck: Normal range of motion.  Cardiovascular: Normal rate, regular rhythm and normal heart sounds.   Respiratory: Effort normal and breath sounds normal.  GI: Soft.  Gravid  Genitourinary:  Genitourinary Comments: Moderate thick whitish discharge located around cervix and along lateral vaginal walls. (+) fishy odor. No vaginal bleeding or vaginal or cervical lacerations.    MAU Course  Procedures  MDM UA returned (+) for Ketones, Nitrite and large leukocytes. Urine culture/wet prep/gc/chlamydia obtained. CBC ordered and returned normal. CMP wnl. Gave 1L  bolus of LR with phenergan 12.5 IV x 1. Patient wet prep returned (+) for clue cells and (-) for yeast and trichomonas. Patient tolerating thin liquids and crackers.  Assessment and Plan  1. Intractable Nausea and Vomiting in pregnancy -explained to patient current symptoms were likely precipitated by underlying UTI -encouraged continued intake of ginger based foods, oral rehydration -will dc to  home with short course of phenergan PO q6hr as needed  2. Urinary Tract Infection -Will prescribe Macrobid 100mg  BID x 5 days -Counseled on importance of compliance with taking abx and consequences of UTI that is left untreated such as pyelonephritis and potential adverse fetal outcomes  3.Bacterial vaginosis -Will prescribe Flagyl 500mg  BID x 7 days -Pt encouraged to continue abx course to completion with meals -Discussed risk of PTL if BV left untreated. -discussed warning signs and symptoms of preterm labor and to call office or return to MAU if symptoms persist or worsen.  WALLACE, NOAH I ,DO PGY-3 12/21/2015, 6:03 PM    OB FELLOW MAU DISCHARGE ATTESTATION  I have seen and examined this patient; I agree with above documentation in the resident's note.    Jen MowElizabeth Jakeya Gherardi, DO OB Fellow

## 2015-12-21 NOTE — Discharge Instructions (Signed)
Nausea and Vomiting, Adult Introduction Feeling sick to your stomach (nausea) means that your stomach is upset or you feel like you have to throw up (vomit). Feeling more and more sick to your stomach can lead to throwing up. Throwing up happens when food and liquid from your stomach are thrown up and out the mouth. Throwing up can make you feel weak and cause you to get dehydrated. Dehydration can make you tired and thirsty, make you have a dry mouth, and make it so you pee (urinate) less often. Older adults and people with other diseases or a weak defense system (immune system) are at higher risk for dehydration. If you feel sick to your stomach or if you throw up, it is important to follow instructions from your doctor about how to take care of yourself. Follow these instructions at home: Eating and drinking Follow these instructions as told by your doctor:  Take an oral rehydration solution (ORS). This is a drink that is sold at pharmacies and stores.  Drink clear fluids in small amounts as you are able, such as:  Water.  Ice chips.  Diluted fruit juice.  Low-calorie sports drinks.  Eat bland, easy-to-digest foods in small amounts as you are able, such as:  Bananas.  Applesauce.  Rice.  Low-fat (lean) meats.  Toast.  Crackers.  Avoid fluids that have a lot of sugar or caffeine in them.  Avoid alcohol.  Avoid spicy or fatty foods. General instructions  Drink enough fluid to keep your pee (urine) clear or pale yellow.  Wash your hands often. If you cannot use soap and water, use hand sanitizer.  Make sure that all people in your home wash their hands well and often.  Take over-the-counter and prescription medicines only as told by your doctor.  Rest at home while you get better.  Watch your condition for any changes.  Breathe slowly and deeply when you feel sick to your stomach.  Keep all follow-up visits as told by your doctor. This is important. Contact a  doctor if:  You have a fever.  You cannot keep fluids down.  Your symptoms get worse.  You have new symptoms.  You feel sick to your stomach for more than two days.  You feel light-headed or dizzy.  You have a headache.  You have muscle cramps. Get help right away if:  You have pain in your chest, neck, arm, or jaw.  You feel very weak or you pass out (faint).  You throw up again and again.  You see blood in your throw-up.  Your throw-up looks like black coffee grounds.  You have bloody or black poop (stools) or poop that look like tar.  You have a very bad headache, a stiff neck, or both.  You have a rash.  You have very bad pain, cramping, or bloating in your belly (abdomen).  You have trouble breathing.  You are breathing very quickly.  Your heart is beating very quickly.  Your skin feels cold and clammy.  You feel confused.  You have pain when you pee.  You have signs of dehydration, such as:  Dark pee, hardly any pee, or no pee.  Cracked lips.  Dry mouth.  Sunken eyes.  Sleepiness.  Weakness. These symptoms may be an emergency. Do not wait to see if the symptoms will go away. Get medical help right away. Call your local emergency services (911 in the [place] Do not drive yourself to the hospital.  This information  is not intended to replace advice given to you by your health care provider. Make sure you discuss any questions you have with your health care provider. Document Released: 06/26/2007 Document Revised: 07/28/2015 Document Reviewed: 09/13/2014  2017 Elsevier Pregnancy and Urinary Tract Infection WHAT IS A URINARY TRACT INFECTION? A urinary tract infection (UTI) is an infection of any part of the urinary tract. This includes the kidneys, the tubes that connect your kidneys to your bladder (ureters), the bladder, and the tube that carries urine out of your body (urethra). These organs make, store, and get rid of urine in the body. A UTI  can be a bladder infection (cystitis) or a kidney infection (pyelonephritis). This infection may be caused by fungi, viruses, and bacteria. Bacteria are the most common cause of UTIs. You are more likely to develop a UTI during pregnancy because:  The physical and hormonal changes your body goes through can make it easier for bacteria to get into your urinary tract.  Your growing baby puts pressure on your uterus and can affect urine flow. DOES A UTI PLACE MY BABY AT RISK? An untreated UTI during pregnancy could lead to a kidney infection, which can cause health problems that could affect your baby. Possible complications of an untreated UTI include:  Having your baby before 37 weeks of pregnancy (premature).  Having a baby with a low birth weight.  Developing high blood pressure during pregnancy (preeclampsia). WHAT ARE THE SYMPTOMS OF A UTI? Symptoms of a UTI include:  Fever.  Frequent urination or passing small amounts of urine frequently.  Needing to urinate urgently.  Pain or a burning sensation with urination.  Urine that smells bad or unusual.  Cloudy urine.  Pain in the lower abdomen or back.  Trouble urinating.  Blood in the urine.  Vomiting or being less hungry than normal.  Diarrhea or abdominal pain.  Vaginal discharge. WHAT ARE THE TREATMENT OPTIONS FOR A UTI DURING PREGNANCY? Treatment for this condition may include:  Antibiotic medicines that are safe to take during pregnancy.  Other medicines to treat less common causes of UTI. HOW CAN I PREVENT A UTI? To prevent a UTI:  Go to the bathroom as soon as you feel the need.  Always wipe from front to back.  Wash your genital area with soap and warm water daily.  Empty your bladder before and after sex.  Wear cotton underwear.  Limit your intake of high sugar foods or drinks, such as regular soda, juice, and sweets..  Drink 6-8 glasses of water daily.  Do not wear tight-fitting pants.  Do not  douche or use deodorant sprays.  Do not drink alcohol, caffeine, or carbonated drinks. These can irritate the bladder. WHEN SHOULD I SEEK MEDICAL CARE? Seek medical care if:  Your symptoms do not improve or get worse.  You have a fever after two days of treatment.  You have a rash.  You have abnormal vaginal discharge.  You have back or side pain.  You have chills.  You have nausea and vomiting. WHEN SHOULD I SEEK IMMEDIATE MEDICAL CARE? Seek immediate medical care if you are pregnant and:  You feel contractions in your uterus.  You have lower belly pain.  You have a gush of fluid from your vagina.  You have blood in your urine.  You are vomiting and cannot keep down any medicines or water. This information is not intended to replace advice given to you by your health care provider. Make sure you  discuss any questions you have with your health care provider. Document Released: 05/04/2010 Document Revised: 06/12/2015 Document Reviewed: 11/28/2014 Elsevier Interactive Patient Education  2017 Elsevier Inc. Bacterial Vaginosis Bacterial vaginosis is an infection of the vagina. It happens when too many germs (bacteria) grow in the vagina. This infection puts you at risk for infections from sex (STIs). Treating this infection can lower your risk for some STIs. You should also treat this if you are pregnant. It can cause your baby to be born early. Follow these instructions at home: Medicines  Take over-the-counter and prescription medicines only as told by your doctor.  Take or use your antibiotic medicine as told by your doctor. Do not stop taking or using it even if you start to feel better. General instructions  If you your sexual partner is a woman, tell her that you have this infection. She needs to get treatment if she has symptoms. If you have a female partner, he does not need to be treated.  During treatment:  Avoid sex.  Do not douche.  Avoid alcohol as  told.  Avoid breastfeeding as told.  Drink enough fluid to keep your pee (urine) clear or pale yellow.  Keep your vagina and butt (rectum) clean.  Wash the area with warm water every day.  Wipe from front to back after you use the toilet.  Keep all follow-up visits as told by your doctor. This is important. Preventing this condition  Do not douche.  Use only warm water to wash around your vagina.  Use protection when you have sex. This includes:  Latex condoms.  Dental dams.  Limit how many people you have sex with. It is best to only have sex with the same person (be monogamous).  Get tested for STIs. Have your partner get tested.  Wear underwear that is cotton or lined with cotton.  Avoid tight pants and pantyhose. This is most important in summer.  Do not use any products that have nicotine or tobacco in them. These include cigarettes and e-cigarettes. If you need help quitting, ask your doctor.  Do not use illegal drugs.  Limit how much alcohol you drink. Contact a doctor if:  Your symptoms do not get better, even after you are treated.  You have more discharge or pain when you pee (urinate).  You have a fever.  You have pain in your belly (abdomen).  You have pain with sex.  Your bleed from your vagina between periods. Summary  This infection happens when too many germs (bacteria) grow in the vagina.  Treating this condition can lower your risk for some infections from sex (STIs).  You should also treat this if you are pregnant. It can cause early (premature) birth.  Do not stop taking or using your antibiotic medicine even if you start to feel better. This information is not intended to replace advice given to you by your health care provider. Make sure you discuss any questions you have with your health care provider. Document Released: 10/17/2007 Document Revised: 09/23/2015 Document Reviewed: 09/23/2015 Elsevier Interactive Patient Education   2017 [employer]

## 2015-12-21 NOTE — MAU Note (Signed)
Pt reports she has been vomiting x 2 days. Denies abd pain or cramping.

## 2015-12-22 LAB — GC/CHLAMYDIA PROBE AMP (~~LOC~~) NOT AT ARMC
Chlamydia: NEGATIVE
NEISSERIA GONORRHEA: NEGATIVE

## 2015-12-23 LAB — URINE CULTURE

## 2015-12-26 ENCOUNTER — Telehealth: Payer: Self-pay

## 2015-12-26 NOTE — Telephone Encounter (Signed)
Pt called requesting an Rx for a yeast infection.

## 2016-01-02 ENCOUNTER — Ambulatory Visit: Payer: Medicaid Other | Admitting: General Practice

## 2016-01-02 DIAGNOSIS — B9689 Other specified bacterial agents as the cause of diseases classified elsewhere: Secondary | ICD-10-CM

## 2016-01-02 DIAGNOSIS — N760 Acute vaginitis: Principal | ICD-10-CM

## 2016-01-02 NOTE — Progress Notes (Signed)
Instructed patient in self wet prep.

## 2016-01-02 NOTE — Telephone Encounter (Signed)
Called patient and she states the symptoms have improved some with daily probiotics but she isn't sure if it is gone. Told patient she can come in for a self wet prep today. Patient states she will be here between 3-4 today. Patient had no other questions

## 2016-01-03 LAB — WET PREP, GENITAL: TRICH WET PREP: NONE SEEN

## 2016-01-04 ENCOUNTER — Other Ambulatory Visit: Payer: Self-pay | Admitting: Obstetrics and Gynecology

## 2016-01-04 MED ORDER — METRONIDAZOLE 500 MG PO TABS: 500.0000 mg | ORAL_TABLET | Freq: Two times a day (BID) | ORAL | 0 refills | Status: DC

## 2016-01-04 MED ORDER — FLUCONAZOLE 150 MG PO TABS: 150.0000 mg | ORAL_TABLET | Freq: Once | ORAL | 0 refills | Status: AC

## 2016-01-05 ENCOUNTER — Telehealth: Payer: Self-pay | Admitting: *Deleted

## 2016-01-05 MED ORDER — FLUCONAZOLE 150 MG PO TABS: 150.0000 mg | ORAL_TABLET | Freq: Once | ORAL | 0 refills | Status: AC

## 2016-01-05 NOTE — Telephone Encounter (Signed)
Per message from Dr. Jolayne Pantheronstant, pt has +BV and +yeast infection. Prescriptions have been sent to her pharmacy. Since we will be closed until 12/18, I called pt and left message stating that prescriptions have been sent to her pharmacy.  If she has questions please call back.

## 2016-01-12 ENCOUNTER — Ambulatory Visit (INDEPENDENT_AMBULATORY_CARE_PROVIDER_SITE_OTHER): Payer: Medicaid Other | Admitting: Obstetrics & Gynecology

## 2016-01-12 VITALS — BP 107/64 | HR 82 | Wt 253.6 lb

## 2016-01-12 DIAGNOSIS — Z3483 Encounter for supervision of other normal pregnancy, third trimester: Secondary | ICD-10-CM | POA: Diagnosis present

## 2016-01-12 DIAGNOSIS — O219 Vomiting of pregnancy, unspecified: Secondary | ICD-10-CM | POA: Diagnosis not present

## 2016-01-12 DIAGNOSIS — R110 Nausea: Secondary | ICD-10-CM

## 2016-01-12 DIAGNOSIS — Z23 Encounter for immunization: Secondary | ICD-10-CM | POA: Diagnosis not present

## 2016-01-12 DIAGNOSIS — Z3480 Encounter for supervision of other normal pregnancy, unspecified trimester: Secondary | ICD-10-CM

## 2016-01-12 LAB — CBC
HEMATOCRIT: 36 % (ref 35.0–45.0)
HEMOGLOBIN: 11.6 g/dL — AB (ref 11.7–15.5)
MCH: 27.8 pg (ref 27.0–33.0)
MCHC: 32.2 g/dL (ref 32.0–36.0)
MCV: 86.1 fL (ref 80.0–100.0)
MPV: 11.1 fL (ref 7.5–12.5)
Platelets: 265 10*3/uL (ref 140–400)
RBC: 4.18 MIL/uL (ref 3.80–5.10)
RDW: 14 % (ref 11.0–15.0)
WBC: 7.6 10*3/uL (ref 3.8–10.8)

## 2016-01-12 MED ORDER — ONDANSETRON 4 MG PO TBDP
4.0000 mg | ORAL_TABLET | Freq: Once | ORAL | Status: AC
  Administered 2016-01-12: 4 mg via ORAL

## 2016-01-12 NOTE — Patient Instructions (Signed)
AREA PEDIATRIC/FAMILY PRACTICE PHYSICIANS   [facility] [address], [address] [place], [place]  [address] Phone - [phone]   Fax - [phone]  [facility] [address] [place], [place] [address] Phone - [phone]   Fax - [phone]  [facility] [address] [place], [place]  [address] Phone - [phone]   Fax - [phone]  [facility] [address] [place], [place]  [address] Phone - [phone]   Fax - [phone]  [facility] [address] [place], [place]  [address] Phone - [phone]   Fax - [phone]  [facility] [address] [place], [place]  [address] Phone - [phone]   Fax - [phone]  [facility] [address] [place], [place]  [address] Phone - [phone]   Fax - [phone]  [facility] [address] [place], [place]  [address] Phone - [phone]   Fax - [phone]  [facility] [address] [place], [place]  [address] Phone - [phone]   Fax - [phone] [facility] [address] [place], [place]  [address] Phone - [phone]   Fax - [phone]  [facility] [address] [place], [place]  [address] Phone - [phone]   Fax - [phone]  [facility] [address] [place], [place]  [address] Phone - [phone]   Fax - [phone]  [facility] [address] [place], [place]  [address] Phone - [phone]   Fax - [phone]  [facility] [address] [place], [place]  [address] Phone - [phone]  [facility] [address] [place], [place]  [address] Phone - [phone]   Fax - [phone]  [facility] [address] [place], [place]  [address] Phone - [phone]   Fax - [phone]  [facility] [address] [place], [place]  [address] Phone - [phone]   Fax - [phone]  [facility] [address], [place]  [address] Phone - [phone]   Fax - [phone]   [facility] [address] [place], [place]  [address] Phone - [phone]   Fax - [phone]  [facility] [address] [place], [place]  [address] Phone - [phone]   Fax - [phone]  [facility] [address] [place], [place]  [address] Phone - [phone]   Fax - [phone]  [facility] [address] [place], [place]  [address] Phone - [phone]   Fax - [phone]  [facility] [address] [place], [place]  [address] Phone - [phone]   Fax - [phone] [facility] [address] [place], [place]  [address] Phone - [phone]   Fax - [phone]  [facility] [address] [place], [place]  [address] Phone - [phone]   Fax - [phone]  [facility] [address] , [place]  [address] Phone - [phone]   Fax - [phone]   

## 2016-01-12 NOTE — Progress Notes (Signed)
   PRENATAL VISIT NOTE  Subjective:  Lisa Brown is a 25 y.o. G4P0030 at 3245w6d being seen today for ongoing prenatal care.  She is currently monitored for the following issues for this low-risk pregnancy and has Nausea/vomiting in pregnancy; Supervision of normal pregnancy, antepartum; BMI 36.0-36.9,adult; Obesity in pregnancy; and Marijuana use on her problem list.  Patient reports no complaints.  Contractions: Not present. Vag. Bleeding: None.  Movement: Present. Denies leaking of fluid.   The following portions of the patient's history were reviewed and updated as appropriate: allergies, current medications, past family history, past medical history, past social history, past surgical history and problem list. Problem list updated.  Objective:   Vitals:   01/12/16 0834  BP: 107/64  Pulse: 82  Weight: 253 lb 9.6 oz (115 kg)    Fetal Status: Fetal Heart Rate (bpm): 140 lb Fundal Height: 29 cm Movement: Present     General:  Alert, oriented and cooperative. Patient is in no acute distress.  Skin: Skin is warm and dry. No rash noted.   Cardiovascular: Normal heart rate noted  Respiratory: Normal respiratory effort, no problems with respiration noted  Abdomen: Soft, gravid, appropriate for gestational age. Pain/Pressure: Present     Pelvic:  Cervical exam deferred        Extremities: Normal range of motion.  Edema: Trace  Mental Status: Normal mood and affect. Normal behavior. Normal judgment and thought content.   Assessment and Plan:  Pregnancy: G4P0030 at 8945w6d  1. Supervision of other normal pregnancy, antepartum Third trimester labs and Tdap - 2Hr GTT w/ 1 Hr Carpenter 75 g - CBC - RPR - HIV antibody (with reflex) - Tdap vaccine greater than or equal to 7yo IM  2. Nausea Had some nausea after 2 hr GTT, one dose of Zofran given to patient in office.  - ondansetron (ZOFRAN-ODT) disintegrating tablet 4 mg; Take 1 tablet (4 mg total) by mouth once.  Preterm labor symptoms  and general obstetric precautions including but not limited to vaginal bleeding, contractions, leaking of fluid and fetal movement were reviewed in detail with the patient. Please refer to After Visit Summary for other counseling recommendations.  Return in about 3 weeks (around 02/02/2016) for OB Visit.   Tereso NewcomerUgonna A Gyasi Hazzard, MD

## 2016-01-13 LAB — 2HR GTT W 1 HR, CARPENTER, 75 G
GLUCOSE, 1 HR, GEST: 84 mg/dL (ref <180)
GLUCOSE, FASTING, GEST: 77 mg/dL (ref 65–91)
Glucose, 2 Hr, Gest: 65 mg/dL (ref <153)

## 2016-01-13 LAB — HIV ANTIBODY (ROUTINE TESTING W REFLEX): HIV: NONREACTIVE

## 2016-01-13 LAB — RPR

## 2016-01-22 NOTE — L&D Delivery Note (Signed)
Delivery Note At 7:37 PM a viable female was delivered via Vaginal, Spontaneous Delivery under water in tub (Presentation: OA).  APGAR: 8, 9; weight  pending.   Placenta status: delivered intact via Tomasa BlaseSchultz.  Cord: 3V with the following complications: none .  Cord pH: n/a  Anesthesia: local Episiotomy: None Lacerations: bilateral labial, 1st degree perineal  Suture Repair: 2.0 3.0 vicryl rapide Est. Blood Loss (mL): 100  Mom to postpartum.  Baby to Couplet care / Skin to Skin.  Donette LarryMelanie Abraham Margulies, CNM 04/06/2016, 8:17 PM

## 2016-01-24 ENCOUNTER — Encounter (HOSPITAL_COMMUNITY): Payer: Self-pay | Admitting: *Deleted

## 2016-01-24 ENCOUNTER — Inpatient Hospital Stay (HOSPITAL_COMMUNITY)
Admission: AD | Admit: 2016-01-24 | Discharge: 2016-01-24 | Disposition: A | Discharge status: Home / Self Care | Payer: Medicaid Other | Source: Ambulatory Visit | Attending: Family Medicine | Admitting: Family Medicine

## 2016-01-24 DIAGNOSIS — R05 Cough: Secondary | ICD-10-CM | POA: Diagnosis present

## 2016-01-24 DIAGNOSIS — O9989 Other specified diseases and conditions complicating pregnancy, childbirth and the puerperium: Secondary | ICD-10-CM

## 2016-01-24 DIAGNOSIS — Z833 Family history of diabetes mellitus: Secondary | ICD-10-CM | POA: Insufficient documentation

## 2016-01-24 DIAGNOSIS — Z8249 Family history of ischemic heart disease and other diseases of the circulatory system: Secondary | ICD-10-CM | POA: Insufficient documentation

## 2016-01-24 DIAGNOSIS — Z841 Family history of disorders of kidney and ureter: Secondary | ICD-10-CM | POA: Insufficient documentation

## 2016-01-24 DIAGNOSIS — J45909 Unspecified asthma, uncomplicated: Secondary | ICD-10-CM | POA: Insufficient documentation

## 2016-01-24 DIAGNOSIS — J069 Acute upper respiratory infection, unspecified: Secondary | ICD-10-CM | POA: Diagnosis not present

## 2016-01-24 DIAGNOSIS — Z3480 Encounter for supervision of other normal pregnancy, unspecified trimester: Secondary | ICD-10-CM

## 2016-01-24 DIAGNOSIS — R0981 Nasal congestion: Secondary | ICD-10-CM | POA: Diagnosis present

## 2016-01-24 LAB — URINALYSIS, ROUTINE W REFLEX MICROSCOPIC
Bilirubin Urine: NEGATIVE
GLUCOSE, UA: NEGATIVE mg/dL
Hgb urine dipstick: NEGATIVE
KETONES UR: 20 mg/dL — AB
LEUKOCYTES UA: NEGATIVE
Nitrite: NEGATIVE
PH: 8 (ref 5.0–8.0)
Protein, ur: NEGATIVE mg/dL
Specific Gravity, Urine: 1.011 (ref 1.005–1.030)

## 2016-01-24 NOTE — MAU Note (Signed)
Pt c/o coughing which make her vomit at times.  Pt states it started last week and felt like she was getting better and then it got worse.  Currently taking cough medicine and tylenol.  Pt called clinic to see her OB, was told to come to MAU or urgent care due to no appointments being available.  No vaginal bleeding or ROM.  Good fetal movement.

## 2016-01-24 NOTE — Discharge Instructions (Signed)

## 2016-01-24 NOTE — MAU Provider Note (Signed)
  History     CSN: [id number]  Arrival date and time: 01/24/16 44010904   First Provider Initiated Contact with Patient 01/24/16 1011      Chief Complaint  Patient presents with  . Nasal Congestion   Cough  This is a new problem. The current episode started in the past 7 days. The problem has been unchanged. The problem occurs hourly. The cough is non-productive. Associated symptoms include myalgias, nasal congestion, postnasal drip and rhinorrhea. Pertinent negatives include no chest pain, fever, headaches, sore throat, shortness of breath, sweats or wheezing. Nothing aggravates the symptoms. She has tried nothing for the symptoms. There is no history of asthma, bronchitis or COPD.    OB History    Gravida Para Term Preterm AB Living   4 0 0 0 3 0   SAB TAB Ectopic Multiple Live Births   1 2 0          Past Medical History:  Diagnosis Date  . Asthma     Past Surgical History:  Procedure Laterality Date  . NO PAST SURGERIES      Family History  Problem Relation Age of Onset  . Diabetes Mother   . Diabetes Father   . Kidney disease Father   . Hypertension Father   . Liver disease Father   . Diabetes Maternal Grandmother   . Heart disease Maternal Grandmother   . Diabetes Maternal Grandfather     Social History  Substance Use Topics  . Smoking status: Never Smoker  . Smokeless tobacco: Never Used  . Alcohol use No    Allergies: No Known Allergies  No prescriptions prior to admission.    Review of Systems  Constitutional: Negative for fever.  HENT: Positive for postnasal drip and rhinorrhea. Negative for sore throat.   Respiratory: Positive for cough. Negative for shortness of breath and wheezing.   Cardiovascular: Negative for chest pain and palpitations.  Musculoskeletal: Positive for myalgias.  Neurological: Negative for headaches.   Physical Exam   Blood pressure 104/72, pulse 93, temperature 97.8 F (36.6 C), temperature source Oral, resp. rate 18,  last menstrual period 06/24/2015, SpO2 98 %.  Physical Exam  Vitals reviewed. Constitutional: She appears well-developed and well-nourished.  HENT:  Head: Normocephalic and atraumatic.  Eyes: Conjunctivae and EOM are normal. Pupils are equal, round, and reactive to light. Right eye exhibits no discharge. Left eye exhibits no discharge.  Neck: Normal range of motion. Neck supple.  Cardiovascular: Normal rate and intact distal pulses.   Respiratory: Effort normal and breath sounds normal. No respiratory distress.  GI: Bowel sounds are normal. She exhibits no distension. There is no tenderness. There is no rebound.  Musculoskeletal: Normal range of motion. She exhibits no edema.  Neurological: She is alert.  Skin: Skin is warm and dry. No erythema.  Psychiatric: She has a normal mood and affect. Her behavior is normal.    MAU Course  Procedures  MDM In [place] patient underwent physical exam which was consistent with viral URI. No signs of pneumonia or bacterial sinusitis  NST reviewed and reactive  Assessment and Plan  Viral URI. D/C home with supportive care. List of OTC meds given.   Ernestina Pennaicholas Schenk 01/24/2016, 12:00 PM

## 2016-01-31 ENCOUNTER — Ambulatory Visit (INDEPENDENT_AMBULATORY_CARE_PROVIDER_SITE_OTHER): Payer: Medicaid Other | Admitting: Obstetrics and Gynecology

## 2016-01-31 VITALS — BP 107/61 | HR 68 | Wt 255.8 lb

## 2016-01-31 DIAGNOSIS — O219 Vomiting of pregnancy, unspecified: Secondary | ICD-10-CM

## 2016-01-31 DIAGNOSIS — Z3483 Encounter for supervision of other normal pregnancy, third trimester: Secondary | ICD-10-CM

## 2016-01-31 DIAGNOSIS — Z3480 Encounter for supervision of other normal pregnancy, unspecified trimester: Secondary | ICD-10-CM

## 2016-01-31 NOTE — Progress Notes (Signed)
Subjective:  Lisa Brown is a 26 y.o. G4P0030 at 3049w4d being seen today for ongoing prenatal care.  She is currently monitored for the following issues for this low-risk pregnancy and has Nausea/vomiting in pregnancy; Supervision of normal pregnancy, antepartum; BMI 36.0-36.9,adult; Obesity in pregnancy; and Marijuana use on her problem list.  Patient reports no complaints.  Contractions: Not present. Vag. Bleeding: None.  Movement: Present. Denies leaking of fluid.   The following portions of the patient's history were reviewed and updated as appropriate: allergies, current medications, past family history, past medical history, past social history, past surgical history and problem list. Problem list updated.  Objective:   Vitals:   01/31/16 1012  BP: 107/61  Pulse: 68  Weight: 255 lb 12.8 oz (116 kg)    Fetal Status: Fetal Heart Rate (bpm): 143   Movement: Present     General:  Alert, oriented and cooperative. Patient is in no acute distress.  Skin: Skin is warm and dry. No rash noted.   Cardiovascular: Normal heart rate noted  Respiratory: Normal respiratory effort, no problems with respiration noted  Abdomen: Soft, gravid, appropriate for gestational age. Pain/Pressure: Present     Pelvic:  Cervical exam deferred        Extremities: Normal range of motion.  Edema: Trace  Mental Status: Normal mood and affect. Normal behavior. Normal judgment and thought content.   Urinalysis:      Assessment and Plan:  Pregnancy: G4P0030 at 8049w4d  1. Supervision of other normal pregnancy, antepartum   2. Nausea/vomiting in pregnancy   Preterm labor symptoms and general obstetric precautions including but not limited to vaginal bleeding, contractions, leaking of fluid and fetal movement were reviewed in detail with the patient. Please refer to After Visit Summary for other counseling recommendations.  Return in about 2 weeks (around 02/14/2016) for OB visit.   Hermina StaggersMichael L Uchenna Seufert, MD

## 2016-02-14 ENCOUNTER — Ambulatory Visit (INDEPENDENT_AMBULATORY_CARE_PROVIDER_SITE_OTHER): Payer: Medicaid Other | Admitting: Obstetrics and Gynecology

## 2016-02-14 VITALS — BP 117/71 | HR 80 | Wt 261.1 lb

## 2016-02-14 DIAGNOSIS — N898 Other specified noninflammatory disorders of vagina: Secondary | ICD-10-CM

## 2016-02-14 DIAGNOSIS — O26899 Other specified pregnancy related conditions, unspecified trimester: Principal | ICD-10-CM

## 2016-02-14 DIAGNOSIS — Z3480 Encounter for supervision of other normal pregnancy, unspecified trimester: Secondary | ICD-10-CM

## 2016-02-14 DIAGNOSIS — O26893 Other specified pregnancy related conditions, third trimester: Secondary | ICD-10-CM

## 2016-02-14 NOTE — Patient Instructions (Signed)
Third Trimester of Pregnancy The third trimester is from week 29 through week 40 (months 7 through 9). The third trimester is a time when the unborn baby (fetus) is growing rapidly. At the end of the ninth month, the fetus is about 20 inches in length and weighs 6-10 pounds. Body changes during your third trimester Your body goes through many changes during pregnancy. The changes vary from woman to woman. During the third trimester:  Your weight will continue to increase. You can expect to gain 25-35 pounds (11-16 kg) by the end of the pregnancy.  You may begin to get stretch marks on your hips, abdomen, and breasts.  You may urinate more often because the fetus is moving lower into your pelvis and pressing on your bladder.  You may develop or continue to have heartburn. This is caused by increased hormones that slow down muscles in the digestive tract.  You may develop or continue to have constipation because increased hormones slow digestion and cause the muscles that push waste through your intestines to relax.  You may develop hemorrhoids. These are swollen veins (varicose veins) in the rectum that can itch or be painful.  You may develop swollen, bulging veins (varicose veins) in your legs.  You may have increased body aches in the pelvis, back, or thighs. This is due to weight gain and increased hormones that are relaxing your joints.  You may have changes in your hair. These can include thickening of your hair, rapid growth, and changes in texture. Some women also have hair loss during or after pregnancy, or hair that feels dry or thin. Your hair will most likely return to normal after your baby is born.  Your breasts will continue to grow and they will continue to become tender. A yellow fluid (colostrum) may leak from your breasts. This is the first milk you are producing for your baby.  Your belly button may stick out.  You may notice more swelling in your hands, face, or  ankles.  You may have increased tingling or numbness in your hands, arms, and legs. The skin on your belly may also feel numb.  You may feel short of breath because of your expanding uterus.  You may have more problems sleeping. This can be caused by the size of your belly, increased need to urinate, and an increase in your body's metabolism.  You may notice the fetus "dropping," or moving lower in your abdomen.  You may have increased vaginal discharge.  Your cervix becomes thin and soft (effaced) near your due date. What to expect at prenatal visits You will have prenatal exams every 2 weeks until week 36. Then you will have weekly prenatal exams. During a routine prenatal visit:  You will be weighed to make sure you and the fetus are growing normally.  Your blood pressure will be taken.  Your abdomen will be measured to track your baby's growth.  The fetal heartbeat will be listened to.  Any test results from the previous visit will be discussed.  You may have a cervical check near your due date to see if you have effaced. At around 36 weeks, your health care provider will check your cervix. At the same time, your health care provider will also perform a test on the secretions of the vaginal tissue. This test is to determine if a type of bacteria, Group B streptococcus, is present. Your health care provider will explain this further. Your health care provider may ask you:    What your birth plan is.  How you are feeling.  If you are feeling the baby move.  If you have had any abnormal symptoms, such as leaking fluid, bleeding, severe headaches, or abdominal cramping.  If you are using any tobacco products, including cigarettes, chewing tobacco, and electronic cigarettes.  If you have any questions. Other tests or screenings that may be performed during your third trimester include:  Blood tests that check for low iron levels (anemia).  Fetal testing to check the health,  activity level, and growth of the fetus. Testing is done if you have certain medical conditions or if there are problems during the pregnancy.  Nonstress test (NST). This test checks the health of your baby to make sure there are no signs of problems, such as the baby not getting enough oxygen. During this test, a belt is placed around your belly. The baby is made to move, and its heart rate is monitored during movement. What is false labor? False labor is a condition in which you feel small, irregular tightenings of the muscles in the womb (contractions) that eventually go away. These are called Braxton Hicks contractions. Contractions may last for hours, days, or even weeks before true labor sets in. If contractions come at regular intervals, become more frequent, increase in intensity, or become painful, you should see your health care provider. What are the signs of labor?  Abdominal cramps.  Regular contractions that start at 10 minutes apart and become stronger and more frequent with time.  Contractions that start on the top of the uterus and spread down to the lower abdomen and back.  Increased pelvic pressure and dull back pain.  A watery or bloody mucus discharge that comes from the vagina.  Leaking of amniotic fluid. This is also known as your "water breaking." It could be a slow trickle or a gush. Let your doctor know if it has a color or strange odor. If you have any of these signs, call your health care provider right away, even if it is before your due date. Follow these instructions at home: Eating and drinking  Continue to eat regular, healthy meals.  Do not eat:  Raw meat or meat spreads.  Unpasteurized milk or cheese.  Unpasteurized juice.  Store-made salad.  Refrigerated smoked seafood.  Hot dogs or deli meat, unless they are piping hot.  More than 6 ounces of albacore tuna a week.  Shark, swordfish, king mackerel, or tile fish.  Store-made salads.  Raw  sprouts, such as mung bean or alfalfa sprouts.  Take prenatal vitamins as told by your health care provider.  Take 1000 mg of calcium daily as told by your health care provider.  If you develop constipation:  Take over-the-counter or prescription medicines.  Drink enough fluid to keep your urine clear or pale yellow.  Eat foods that are high in fiber, such as fresh fruits and vegetables, whole grains, and beans.  Limit foods that are high in fat and processed sugars, such as fried and sweet foods. Activity  Exercise only as directed by your health care provider. Healthy pregnant women should aim for 2 hours and 30 minutes of moderate exercise per week. If you experience any pain or discomfort while exercising, stop.  Avoid heavy lifting.  Do not exercise in extreme heat or humidity, or at high altitudes.  Wear low-heel, comfortable shoes.  Practice good posture.  Do not travel far distances unless it is absolutely necessary and only with the approval   of your health care provider.  Wear your seat belt at all times while in a car, on a bus, or on a plane.  Take frequent breaks and rest with your legs elevated if you have leg cramps or low back pain.  Do not use hot tubs, steam rooms, or saunas.  You may continue to have sex unless your health care provider tells you otherwise. Lifestyle  Do not use any products that contain nicotine or tobacco, such as cigarettes and e-cigarettes. If you need help quitting, ask your health care provider.  Do not drink alcohol.  Do not use any medicinal herbs or unprescribed drugs. These chemicals affect the formation and growth of the baby.  If you develop varicose veins:  Wear support pantyhose or compression stockings as told by your healthcare provider.  Elevate your feet for 15 minutes, 3-4 times a day.  Wear a supportive maternity bra to help with breast tenderness. General instructions  Take over-the-counter and prescription  medicines only as told by your health care provider. There are medicines that are either safe or unsafe to take during pregnancy.  Take warm sitz baths to soothe any pain or discomfort caused by hemorrhoids. Use hemorrhoid cream or witch hazel if your health care provider approves.  Avoid cat litter boxes and soil used by cats. These carry germs that can cause birth defects in the baby. If you have a cat, ask someone to clean the litter box for you.  To prepare for the arrival of your baby:  Take prenatal classes to understand, practice, and ask questions about the labor and delivery.  Make a trial run to the hospital.  Visit the hospital and tour the maternity area.  Arrange for maternity or paternity leave through employers.  Arrange for family and friends to take care of pets while you are in the hospital.  Purchase a rear-facing car seat and make sure you know how to install it in your car.  Pack your hospital bag.  Prepare the baby's nursery. Make sure to remove all pillows and stuffed animals from the baby's crib to prevent suffocation.  Visit your dentist if you have not gone during your pregnancy. Use a soft toothbrush to brush your teeth and be gentle when you floss.  Keep all prenatal follow-up visits as told by your health care provider. This is important. Contact a health care provider if:  You are unsure if you are in labor or if your water has broken.  You become dizzy.  You have mild pelvic cramps, pelvic pressure, or nagging pain in your abdominal area.  You have lower back pain.  You have persistent nausea, vomiting, or diarrhea.  You have an unusual or bad smelling vaginal discharge.  You have pain when you urinate. Get help right away if:  You have a fever.  You are leaking fluid from your vagina.  You have spotting or bleeding from your vagina.  You have severe abdominal pain or cramping.  You have rapid weight loss or weight gain.  You have  shortness of breath with chest pain.  You notice sudden or extreme swelling of your face, hands, ankles, feet, or legs.  Your baby makes fewer than 10 movements in 2 hours.  You have severe headaches that do not go away with medicine.  You have vision changes. Summary  The third trimester is from week 29 through week 40, months 7 through 9. The third trimester is a time when the unborn baby (fetus)   is growing rapidly.  During the third trimester, your discomfort may increase as you and your baby continue to gain weight. You may have abdominal, leg, and back pain, sleeping problems, and an increased need to urinate.  During the third trimester your breasts will keep growing and they will continue to become tender. A yellow fluid (colostrum) may leak from your breasts. This is the first milk you are producing for your baby.  False labor is a condition in which you feel small, irregular tightenings of the muscles in the womb (contractions) that eventually go away. These are called Braxton Hicks contractions. Contractions may last for hours, days, or even weeks before true labor sets in.  Signs of labor can include: abdominal cramps; regular contractions that start at 10 minutes apart and become stronger and more frequent with time; watery or bloody mucus discharge that comes from the vagina; increased pelvic pressure and dull back pain; and leaking of amniotic fluid. This information is not intended to replace advice given to you by your health care provider. Make sure you discuss any questions you have with your health care provider. Document Released: 01/01/2001 Document Revised: 06/15/2015 Document Reviewed: 03/10/2012 Elsevier Interactive Patient Education  2017 [employer]  

## 2016-02-14 NOTE — Progress Notes (Addendum)
Subjective:  Lisa Brown is a 26 y.o. G4P0030 at 174w4d being seen today for ongoing prenatal care.  She is currently monitored for the following issues for this low-risk pregnancy and has Nausea/vomiting in pregnancy; Supervision of normal pregnancy, antepartum; BMI 36.0-36.9,adult; Obesity in pregnancy; and Marijuana use on her problem list.  Patient reports vaginal discharge.  Contractions: Irritability. Vag. Bleeding: None.  Movement: Present. Denies leaking of fluid.   The following portions of the patient's history were reviewed and updated as appropriate: allergies, current medications, past family history, past medical history, past social history, past surgical history and problem list. Problem list updated.  Objective:   Vitals:   02/14/16 1314  BP: 117/71  Pulse: 80  Weight: 261 lb 1.6 oz (118.4 kg)    Fetal Status: Fetal Heart Rate (bpm): 142   Movement: Present     General:  Alert, oriented and cooperative. Patient is in no acute distress.  Skin: Skin is warm and dry. No rash noted.   Cardiovascular: Normal heart rate noted  Respiratory: Normal respiratory effort, no problems with respiration noted  Abdomen: Soft, gravid, appropriate for gestational age. Pain/Pressure: Present     Pelvic:  Cervical exam deferred        Extremities: Normal range of motion.  Edema: Trace  Mental Status: Normal mood and affect. Normal behavior. Normal judgment and thought content.   Urinalysis:      Assessment and Plan:  Pregnancy: G4P0030 at 274w4d  1. Vaginal discharge during pregnancy, antepartum  - Wet prep, genital  2. Supervision of other normal pregnancy, antepartum Considering water birth Will schedule next OB visit with midwife  Preterm labor symptoms and general obstetric precautions including but not limited to vaginal bleeding, contractions, leaking of fluid and fetal movement were reviewed in detail with the patient. Please refer to After Visit Summary for other  counseling recommendations.  Return in about 2 weeks (around 02/28/2016).   Hermina StaggersMichael L Shawnie Nicole, MD

## 2016-02-15 ENCOUNTER — Telehealth: Payer: Self-pay | Admitting: *Deleted

## 2016-02-15 LAB — WET PREP, GENITAL
Clue Cells Wet Prep HPF POC: NONE SEEN
Trich, Wet Prep: NONE SEEN

## 2016-02-15 MED ORDER — METRONIDAZOLE 500 MG PO TABS: 500.0000 mg | ORAL_TABLET | Freq: Two times a day (BID) | ORAL | 0 refills | Status: DC

## 2016-02-15 MED ORDER — TERCONAZOLE 0.4 % VA CREA: TOPICAL_CREAM | VAGINAL | 0 refills | Status: DC

## 2016-02-15 NOTE — Telephone Encounter (Signed)
Called pt and informed her of wet prep results showing +BV and +vaginal yeast.  Rx will be sent to her pharmacy. She should complete course of Metronidazole first, then use Terazol vaginal cream.  Pt voiced understanding.

## 2016-02-28 ENCOUNTER — Ambulatory Visit (INDEPENDENT_AMBULATORY_CARE_PROVIDER_SITE_OTHER): Payer: Medicaid Other | Admitting: Medical

## 2016-02-28 ENCOUNTER — Other Ambulatory Visit (HOSPITAL_COMMUNITY)
Admission: RE | Admit: 2016-02-28 | Discharge: 2016-02-28 | Disposition: A | Discharge status: Home / Self Care | Payer: Medicaid Other | Source: Ambulatory Visit | Attending: Medical | Admitting: Medical

## 2016-02-28 VITALS — BP 108/71 | HR 101 | Wt 268.8 lb

## 2016-02-28 DIAGNOSIS — Z3483 Encounter for supervision of other normal pregnancy, third trimester: Secondary | ICD-10-CM

## 2016-02-28 DIAGNOSIS — Z3480 Encounter for supervision of other normal pregnancy, unspecified trimester: Secondary | ICD-10-CM

## 2016-02-28 DIAGNOSIS — Z113 Encounter for screening for infections with a predominantly sexual mode of transmission: Secondary | ICD-10-CM | POA: Diagnosis present

## 2016-02-28 NOTE — Progress Notes (Signed)
   PRENATAL VISIT NOTE  Subjective:  Lisa Brown is a 26 y.o. G4P0030 at 2170w4d being seen today for ongoing prenatal care.  She is currently monitored for the following issues for this low-risk pregnancy and has Nausea/vomiting in pregnancy; Supervision of normal pregnancy, antepartum; BMI 36.0-36.9,adult; Obesity in pregnancy; and Marijuana use on her problem list.  Patient reports no complaints.  Contractions: Not present. Vag. Bleeding: None.  Movement: Present. Denies leaking of fluid.   The following portions of the patient's history were reviewed and updated as appropriate: allergies, current medications, past family history, past medical history, past social history, past surgical history and problem list. Problem list updated.  Objective:   Vitals:   02/28/16 0948  BP: 108/71  Pulse: (!) 101  Weight: 268 lb 12.8 oz (121.9 kg)    Fetal Status: Fetal Heart Rate (bpm): 155 Fundal Height: 36 cm Movement: Present  Presentation: Vertex  General:  Alert, oriented and cooperative. Patient is in no acute distress.  Skin: Skin is warm and dry. No rash noted.   Cardiovascular: Normal heart rate noted  Respiratory: Normal respiratory effort, no problems with respiration noted  Abdomen: Soft, gravid, appropriate for gestational age. Pain/Pressure: Absent     Pelvic:  Cervical exam performed Dilation: Closed Effacement (%): Thick Station: Ballotable  Extremities: Normal range of motion.  Edema: None  Mental Status: Normal mood and affect. Normal behavior. Normal judgment and thought content.   Assessment and Plan:  Pregnancy: G4P0030 at 3670w4d  1. Supervision of other normal pregnancy, antepartum - Culture, beta strep (group b only) - Cervicovaginal ancillary only - Patient interested in waterbirth, signed up for the class already, will see CNM at next visit  Preterm labor symptoms and general obstetric precautions including but not limited to vaginal bleeding, contractions, leaking  of fluid and fetal movement were reviewed in detail with the patient. Please refer to After Visit Summary for other counseling recommendations.  Return in about 1 week (around 03/06/2016) for LOB.   Marny LowensteinJulie N Yuvan Medinger, PA-C

## 2016-02-28 NOTE — Progress Notes (Signed)
36 wk cx today 

## 2016-02-28 NOTE — Patient Instructions (Signed)
Introduction Patient Name: ________________________________________________ Patient Due Date: ____________________ What is a fetal movement count? A fetal movement count is the number of times that you feel your baby move during a certain amount of time. This may also be called a fetal kick count. A fetal movement count is recommended for every pregnant woman. You may be asked to start counting fetal movements as early as week 28 of your pregnancy. Pay attention to when your baby is most active. You may notice your baby's sleep and wake cycles. You may also notice things that make your baby move more. You should do a fetal movement count:  When your baby is normally most active.  At the same time each day. A good time to count movements is while you are resting, after having something to eat and drink. How do I count fetal movements? 1. Find a quiet, comfortable area. Sit, or lie down on your side. 2. Write down the date, the start time and stop time, and the number of movements that you felt between those two times. Take this information with you to your health care visits. 3. For 2 hours, count kicks, flutters, swishes, rolls, and jabs. You should feel at least 10 movements during 2 hours. 4. You may stop counting after you have felt 10 movements. 5. If you do not feel 10 movements in 2 hours, have something to eat and drink. Then, keep resting and counting for 1 hour. If you feel at least 4 movements during that hour, you may stop counting. Contact a health care provider if:  You feel fewer than 4 movements in 2 hours.  Your baby is not moving like he or she usually does. Date: ____________ Start time: ____________ Stop time: ____________ Movements: ____________ Date: ____________ Start time: ____________ Stop time: ____________ Movements: ____________ Date: ____________ Start time: ____________ Stop time: ____________ Movements: ____________ Date: ____________ Start time: ____________  Stop time: ____________ Movements: ____________ Date: ____________ Start time: ____________ Stop time: ____________ Movements: ____________ Date: ____________ Start time: ____________ Stop time: ____________ Movements: ____________ Date: ____________ Start time: ____________ Stop time: ____________ Movements: ____________ Date: ____________ Start time: ____________ Stop time: ____________ Movements: ____________ Date: ____________ Start time: ____________ Stop time: ____________ Movements: ____________ This information is not intended to replace advice given to you by your health care provider. Make sure you discuss any questions you have with your health care provider. Document Released: 02/06/2006 Document Revised: 09/06/2015 Document Reviewed: 02/16/2015 Elsevier Interactive Patient Education  2017 [employer] Braxton Hicks Contractions Contractions of the uterus can occur throughout pregnancy. Contractions are not always a sign that you are in labor.  WHAT ARE BRAXTON HICKS CONTRACTIONS?  Contractions that occur before labor are called Braxton Hicks contractions, or false labor. Toward the end of pregnancy (32-34 weeks), these contractions can develop more often and may become more forceful. This is not true labor because these contractions do not result in opening (dilatation) and thinning of the cervix. They are sometimes difficult to tell apart from true labor because these contractions can be forceful and people have different pain tolerances. You should not feel embarrassed if you go to the hospital with false labor. Sometimes, the only way to tell if you are in true labor is for your health care provider to look for changes in the cervix. If there are no prenatal problems or other health problems associated with the pregnancy, it is completely safe to be sent home with false labor and await the onset of true labor.   HOW CAN YOU TELL THE DIFFERENCE BETWEEN TRUE AND FALSE LABOR? False Labor     The contractions of false labor are usually shorter and not as hard as those of true labor.   The contractions are usually irregular.   The contractions are often felt in the front of the lower abdomen and in the groin.   The contractions may go away when you walk around or change positions while lying down.   The contractions get weaker and are shorter lasting as time goes on.   The contractions do not usually become progressively stronger, regular, and closer together as with true labor.  True Labor   Contractions in true labor last 30-70 seconds, become very regular, usually become more intense, and increase in frequency.   The contractions do not go away with walking.   The discomfort is usually felt in the top of the uterus and spreads to the lower abdomen and low back.   True labor can be determined by your health care provider with an exam. This will show that the cervix is dilating and getting thinner.  WHAT TO REMEMBER  Keep up with your usual exercises and follow other instructions given by your health care provider.   Take medicines as directed by your health care provider.   Keep your regular prenatal appointments.   Eat and drink lightly if you think you are going into labor.   If Braxton Hicks contractions are making you uncomfortable:   Change your position from lying down or resting to walking, or from walking to resting.   Sit and rest in a tub of warm water.   Drink 2-3 glasses of water. Dehydration may cause these contractions.   Do slow and deep breathing several times an hour.  WHEN SHOULD I SEEK IMMEDIATE MEDICAL CARE? Seek immediate medical care if:  Your contractions become stronger, more regular, and closer together.   You have fluid leaking or gushing from your vagina.   You have a fever.   You pass blood-tinged mucus.   You have vaginal bleeding.   You have continuous abdominal pain.   You have low back pain  that you never had before.   You feel your baby's head pushing down and causing pelvic pressure.   Your baby is not moving as much as it used to.  This information is not intended to replace advice given to you by your health care provider. Make sure you discuss any questions you have with your health care provider. Document Released: 01/07/2005 Document Revised: 05/01/2015 Document Reviewed: 10/19/2012 Elsevier Interactive Patient Education  2017 [employer] Inc[employer]  

## 2016-02-29 LAB — CERVICOVAGINAL ANCILLARY ONLY
CHLAMYDIA, DNA PROBE: NEGATIVE
NEISSERIA GONORRHEA: NEGATIVE

## 2016-03-02 LAB — CULTURE, BETA STREP (GROUP B ONLY)

## 2016-03-05 ENCOUNTER — Ambulatory Visit (INDEPENDENT_AMBULATORY_CARE_PROVIDER_SITE_OTHER): Payer: Medicaid Other | Admitting: Certified Nurse Midwife

## 2016-03-05 ENCOUNTER — Ambulatory Visit (INDEPENDENT_AMBULATORY_CARE_PROVIDER_SITE_OTHER): Payer: Self-pay | Admitting: Pediatrics

## 2016-03-05 VITALS — BP 126/72 | HR 93 | Wt 263.5 lb

## 2016-03-05 DIAGNOSIS — Z3483 Encounter for supervision of other normal pregnancy, third trimester: Secondary | ICD-10-CM

## 2016-03-05 DIAGNOSIS — Z3480 Encounter for supervision of other normal pregnancy, unspecified trimester: Secondary | ICD-10-CM

## 2016-03-05 DIAGNOSIS — F1290 Cannabis use, unspecified, uncomplicated: Secondary | ICD-10-CM

## 2016-03-05 DIAGNOSIS — Z3490 Encounter for supervision of normal pregnancy, unspecified, unspecified trimester: Secondary | ICD-10-CM

## 2016-03-05 DIAGNOSIS — Z7681 Expectant parent(s) prebirth pediatrician visit: Secondary | ICD-10-CM

## 2016-03-05 NOTE — Progress Notes (Signed)
Subjective:  Lisa Brown is a 26 y.o. G4P0030 at 6993w3d being seen today for ongoing prenatal care.  She is currently monitored for the following issues for this low-risk pregnancy and has Nausea/vomiting in pregnancy; Supervision of normal pregnancy, antepartum; BMI 36.0-36.9,adult; Obesity in pregnancy; and Marijuana use on her problem list.  Patient reports no complaints.  Contractions: Not present. Vag. Bleeding: None.  Movement: Present. Denies leaking of fluid.   The following portions of the patient's history were reviewed and updated as appropriate: allergies, current medications, past family history, past medical history, past social history, past surgical history and problem list. Problem list updated.  Objective:   Vitals:   03/05/16 1117  BP: 126/72  Pulse: 93  Weight: 263 lb 8 oz (119.5 kg)    Fetal Status: Fetal Heart Rate (bpm): 144 Fundal Height: 36 cm Movement: Present  Presentation: Vertex  General:  Alert, oriented and cooperative. Patient is in no acute distress.  Skin: Skin is warm and dry. No rash noted.   Cardiovascular: Normal heart rate noted  Respiratory: Normal respiratory effort, no problems with respiration noted  Abdomen: Soft, gravid, appropriate for gestational age. Pain/Pressure: Absent     Pelvic: Vag. Bleeding: None     Cervical exam deferred        Extremities: Normal range of motion.  Edema: Trace  Mental Status: Normal mood and affect. Normal behavior. Normal judgment and thought content.   Urinalysis:      Assessment and Plan:  Pregnancy: G4P0030 at 4193w3d  1. Marijuana use - reports last use 08/2015  2. Supervision of other normal pregnancy, antepartum - interested in waterbirth, taking class next week-needs to bring certificate - need to sign consent if decides to proceed with WB  Preterm labor symptoms and general obstetric precautions including but not limited to vaginal bleeding, contractions, leaking of fluid and fetal movement were  reviewed in detail with the patient. Please refer to After Visit Summary for other counseling recommendations.  Return in about 1 week (around 03/12/2016).   Donette LarryMelanie Izela Altier, CNM

## 2016-03-07 NOTE — Progress Notes (Signed)
Prenatal counseling for impending newborn done-- 1st child, 36wks, no complications, prenatal care at 8wks 63Z76.81

## 2016-03-13 ENCOUNTER — Ambulatory Visit (INDEPENDENT_AMBULATORY_CARE_PROVIDER_SITE_OTHER): Payer: Medicaid Other | Admitting: Student

## 2016-03-13 VITALS — BP 123/68 | HR 83 | Wt 268.0 lb

## 2016-03-13 DIAGNOSIS — Z3403 Encounter for supervision of normal first pregnancy, third trimester: Secondary | ICD-10-CM

## 2016-03-13 NOTE — Patient Instructions (Signed)
Braxton Hicks Contractions °Contractions of the uterus can occur throughout pregnancy. Contractions are not always a sign that you are in labor.  °WHAT ARE BRAXTON HICKS CONTRACTIONS?  °Contractions that occur before labor are called Braxton Hicks contractions, or false labor. Toward the end of pregnancy (32-34 weeks), these contractions can develop more often and may become more forceful. This is not true labor because these contractions do not result in opening (dilatation) and thinning of the cervix. They are sometimes difficult to tell apart from true labor because these contractions can be forceful and people have different pain tolerances. You should not feel embarrassed if you go to the hospital with false labor. Sometimes, the only way to tell if you are in true labor is for your health care provider to look for changes in the cervix. °If there are no prenatal problems or other health problems associated with the pregnancy, it is completely safe to be sent home with false labor and await the onset of true labor. °HOW CAN YOU TELL THE DIFFERENCE BETWEEN TRUE AND FALSE LABOR? °False Labor  °· The contractions of false labor are usually shorter and not as hard as those of true labor.   °· The contractions are usually irregular.   °· The contractions are often felt in the front of the lower abdomen and in the groin.   °· The contractions may go away when you walk around or change positions while lying down.   °· The contractions get weaker and are shorter lasting as time goes on.   °· The contractions do not usually become progressively stronger, regular, and closer together as with true labor.   °True Labor  °· Contractions in true labor last 30-70 seconds, become very regular, usually become more intense, and increase in frequency.   °· The contractions do not go away with walking.   °· The discomfort is usually felt in the top of the uterus and spreads to the lower abdomen and low back.   °· True labor can be  determined by your health care provider with an exam. This will show that the cervix is dilating and getting thinner.   °WHAT TO REMEMBER °· Keep up with your usual exercises and follow other instructions given by your health care provider.   °· Take medicines as directed by your health care provider.   °· Keep your regular prenatal appointments.   °· Eat and drink lightly if you think you are going into labor.   °· If Braxton Hicks contractions are making you uncomfortable:   °¨ Change your position from lying down or resting to walking, or from walking to resting.   °¨ Sit and rest in a tub of warm water.   °¨ Drink 2-3 glasses of water. Dehydration may cause these contractions.   °¨ Do slow and deep breathing several times an hour.   °WHEN SHOULD I SEEK IMMEDIATE MEDICAL CARE? °Seek immediate medical care if: °· Your contractions become stronger, more regular, and closer together.   °· You have fluid leaking or gushing from your vagina.   °· You have a fever.   °· You pass blood-tinged mucus.   °· You have vaginal bleeding.   °· You have continuous abdominal pain.   °· You have low back pain that you never had before.   °· You feel your baby's head pushing down and causing pelvic pressure.   °· Your baby is not moving as much as it used to.   °This information is not intended to replace advice given to you by your health care provider. Make sure you discuss any questions you have with your health care   provider. °Document Released: 01/07/2005 Document Revised: 05/01/2015 Document Reviewed: 10/19/2012 °Elsevier Interactive Patient Education © 2017 [employer] Inc. ° °

## 2016-03-13 NOTE — Progress Notes (Signed)
   PRENATAL VISIT NOTE  Subjective:  Lisa Brown is a 26 y.o. G4P0030 at 8014w4d being seen today for ongoing prenatal care.  She is currently monitored for the following issues for this low-risk pregnancy and has Nausea/vomiting in pregnancy; Supervision of normal pregnancy, antepartum; BMI 36.0-36.9,adult; Obesity in pregnancy; and Marijuana use on her problem list.  Patient reports no complaints.  Contractions: Not present.  .  Movement: Present. Denies leaking of fluid.   The following portions of the patient's history were reviewed and updated as appropriate: allergies, current medications, past family history, past medical history, past social history, past surgical history and problem list. Problem list updated.  Objective:   Vitals:   03/13/16 1525  BP: 123/68  Pulse: 83  Weight: 268 lb (121.6 kg)    Fetal Status: Fetal Heart Rate (bpm): 134 Fundal Height: 37 cm Movement: Present     General:  Alert, oriented and cooperative. Patient is in no acute distress.  Skin: Skin is warm and dry. No rash noted.   Cardiovascular: Normal heart rate noted  Respiratory: Normal respiratory effort, no problems with respiration noted  Abdomen: Soft, gravid, appropriate for gestational age. Pain/Pressure: Absent     Pelvic:  Cervical exam deferred        Extremities: Normal range of motion.     Mental Status: Normal mood and affect. Normal behavior. Normal judgment and thought content.   Assessment and Plan:  Pregnancy: G4P0030 at 8414w4d  1. Encounter for supervision of normal first pregnancy in third trimester Patient signed waterbirth consent at today's visit; plans on taking class tonight. Encouraged patient to consider LARC methods since she does not want another child anytime soon.    Term labor symptoms and general obstetric precautions including but not limited to vaginal bleeding, contractions, leaking of fluid and fetal movement were reviewed in detail with the patient. Please refer  to After Visit Summary for other counseling recommendations.  Return in about 1 week (around 03/20/2016), or LROB.   Marylene LandKathryn Lorraine Blayke Cordrey, CNM

## 2016-03-21 ENCOUNTER — Ambulatory Visit (INDEPENDENT_AMBULATORY_CARE_PROVIDER_SITE_OTHER): Payer: Medicaid Other | Admitting: Advanced Practice Midwife

## 2016-03-21 ENCOUNTER — Encounter: Payer: Self-pay | Admitting: Advanced Practice Midwife

## 2016-03-21 DIAGNOSIS — Z3480 Encounter for supervision of other normal pregnancy, unspecified trimester: Secondary | ICD-10-CM

## 2016-03-21 DIAGNOSIS — Z3483 Encounter for supervision of other normal pregnancy, third trimester: Secondary | ICD-10-CM

## 2016-03-21 NOTE — Patient Instructions (Signed)

## 2016-03-21 NOTE — Progress Notes (Signed)
   PRENATAL VISIT NOTE  Subjective:  Lisa Brown is a 26 y.o. G4P0030 at 55110w5d being seen today for ongoing prenatal care.  She is currently monitored for the following issues for this low-risk pregnancy and has Nausea/vomiting in pregnancy; Supervision of normal pregnancy, antepartum; BMI 36.0-36.9,adult; Obesity in pregnancy; and Marijuana use on her problem list.  Patient reports occasional contractions.  Contractions: Irritability. Vag. Bleeding: None.  Movement: Present. Denies leaking of fluid.   The following portions of the patient's history were reviewed and updated as appropriate: allergies, current medications, past family history, past medical history, past social history, past surgical history and problem list. Problem list updated.  Objective:   Vitals:   03/21/16 0959  BP: 110/72  Pulse: 89  Weight: 265 lb 12.8 oz (120.6 kg)    Fetal Status: Fetal Heart Rate (bpm): 133 Fundal Height: 38 cm Movement: Present     General:  Alert, oriented and cooperative. Patient is in no acute distress.  Skin: Skin is warm and dry. No rash noted.   Cardiovascular: Normal heart rate noted  Respiratory: Normal respiratory effort, no problems with respiration noted  Abdomen: Soft, gravid, appropriate for gestational age. Pain/Pressure: Absent     Pelvic:  Cervical exam deferred        Extremities: Normal range of motion.  Edema: Trace  Mental Status: Normal mood and affect. Normal behavior. Normal judgment and thought content.   Reviewed neg GBS.   Assessment and Plan:  Pregnancy: G4P0030 at 73110w5d  1. Supervision of other normal pregnancy, antepartum   Term labor symptoms and general obstetric precautions including but not limited to vaginal bleeding, contractions, leaking of fluid and fetal movement were reviewed in detail with the patient. Please refer to After Visit Summary for other counseling recommendations.  Return in about 1 week (around 03/28/2016) for ROB.   Dorathy KinsmanVirginia  Ouita Nish, CNM

## 2016-03-27 ENCOUNTER — Telehealth: Payer: Self-pay | Admitting: *Deleted

## 2016-03-27 NOTE — Telephone Encounter (Signed)
Pt left message stating that her stomach around the navel is sunken in and she wants to know if this is normal. I called pt back and stated that this close to her due date it is normal for her abdomen to change shape as the baby gets lower in the pelvis. She can discuss further with the midwife at her scheduled visit tomorrow.  Pt voiced understanding.

## 2016-03-28 ENCOUNTER — Ambulatory Visit (INDEPENDENT_AMBULATORY_CARE_PROVIDER_SITE_OTHER): Payer: Medicaid Other | Admitting: Medical

## 2016-03-28 VITALS — BP 128/79 | HR 101 | Wt 269.8 lb

## 2016-03-28 DIAGNOSIS — Z3483 Encounter for supervision of other normal pregnancy, third trimester: Secondary | ICD-10-CM

## 2016-03-28 DIAGNOSIS — Z3480 Encounter for supervision of other normal pregnancy, unspecified trimester: Secondary | ICD-10-CM

## 2016-03-28 NOTE — Patient Instructions (Signed)
Fetal Movement Counts Patient Name: ________________________________________________ Patient Due Date: ____________________ What is a fetal movement count? A fetal movement count is the number of times that you feel your baby move during a certain amount of time. This may also be called a fetal kick count. A fetal movement count is recommended for every pregnant woman. You may be asked to start counting fetal movements as early as week 28 of your pregnancy. Pay attention to when your baby is most active. You may notice your baby's sleep and wake cycles. You may also notice things that make your baby move more. You should do a fetal movement count:  When your baby is normally most active.  At the same time each day. A good time to count movements is while you are resting, after having something to eat and drink. How do I count fetal movements? 1. Find a quiet, comfortable area. Sit, or lie down on your side. 2. Write down the date, the start time and stop time, and the number of movements that you felt between those two times. Take this information with you to your health care visits. 3. For 2 hours, count kicks, flutters, swishes, rolls, and jabs. You should feel at least 10 movements during 2 hours. 4. You may stop counting after you have felt 10 movements. 5. If you do not feel 10 movements in 2 hours, have something to eat and drink. Then, keep resting and counting for 1 hour. If you feel at least 4 movements during that hour, you may stop counting. Contact a health care provider if:  You feel fewer than 4 movements in 2 hours.  Your baby is not moving like he or she usually does. Date: ____________ Start time: ____________ Stop time: ____________ Movements: ____________ Date: ____________ Start time: ____________ Stop time: ____________ Movements: ____________ Date: ____________ Start time: ____________ Stop time: ____________ Movements: ____________ Date: ____________ Start time:  ____________ Stop time: ____________ Movements: ____________ Date: ____________ Start time: ____________ Stop time: ____________ Movements: ____________ Date: ____________ Start time: ____________ Stop time: ____________ Movements: ____________ Date: ____________ Start time: ____________ Stop time: ____________ Movements: ____________ Date: ____________ Start time: ____________ Stop time: ____________ Movements: ____________ Date: ____________ Start time: ____________ Stop time: ____________ Movements: ____________ This information is not intended to replace advice given to you by your health care provider. Make sure you discuss any questions you have with your health care provider. Document Released: 02/06/2006 Document Revised: 09/06/2015 Document Reviewed: 02/16/2015 Elsevier Interactive Patient Education  2017 [employer] Braxton Hicks Contractions Contractions of the uterus can occur throughout pregnancy, but they are not always a sign that you are in labor. You may have practice contractions called Braxton Hicks contractions. These false labor contractions are sometimes confused with true labor. What are Braxton Hicks contractions? Braxton Hicks contractions are tightening movements that occur in the muscles of the uterus before labor. Unlike true labor contractions, these contractions do not result in opening (dilation) and thinning of the cervix. Toward the end of pregnancy (32-34 weeks), Braxton Hicks contractions can happen more often and may become stronger. These contractions are sometimes difficult to tell apart from true labor because they can be very uncomfortable. You should not feel embarrassed if you go to the hospital with false labor. Sometimes, the only way to tell if you are in true labor is for your health care provider to look for changes in the cervix. The health care provider will do a physical exam and may monitor your contractions. If you   are not in true labor, the exam  should show that your cervix is not dilating and your water has not broken. If there are no prenatal problems or other health problems associated with your pregnancy, it is completely safe for you to be sent home with false labor. You may continue to have Braxton Hicks contractions until you go into true labor. How can I tell the difference between true labor and false labor?  Differences  False labor  Contractions last 30-70 seconds.: Contractions are usually shorter and not as strong as true labor contractions.  Contractions become very regular.: Contractions are usually irregular.  Discomfort is usually felt in the top of the uterus, and it spreads to the lower abdomen and low back.: Contractions are often felt in the front of the lower abdomen and in the groin.  Contractions do not go away with walking.: Contractions may go away when you walk around or change positions while lying down.  Contractions usually become more intense and increase in frequency.: Contractions get weaker and are shorter-lasting as time goes on.  The cervix dilates and gets thinner.: The cervix usually does not dilate or become thin. Follow these instructions at home:  Take over-the-counter and prescription medicines only as told by your health care provider.  Keep up with your usual exercises and follow other instructions from your health care provider.  Eat and drink lightly if you think you are going into labor.  If Braxton Hicks contractions are making you uncomfortable:  Change your position from lying down or resting to walking, or change from walking to resting.  Sit and rest in a tub of warm water.  Drink enough fluid to keep your urine clear or pale yellow. Dehydration may cause these contractions.  Do slow and deep breathing several times an hour.  Keep all follow-up prenatal visits as told by your health care provider. This is important. Contact a health care provider if:  You have a  fever.  You have continuous pain in your abdomen. Get help right away if:  Your contractions become stronger, more regular, and closer together.  You have fluid leaking or gushing from your vagina.  You pass blood-tinged mucus (bloody show).  You have bleeding from your vagina.  You have low back pain that you never had before.  You feel your baby's head pushing down and causing pelvic pressure.  Your baby is not moving inside you as much as it used to. Summary  Contractions that occur before labor are called Braxton Hicks contractions, false labor, or practice contractions.  Braxton Hicks contractions are usually shorter, weaker, farther apart, and less regular than true labor contractions. True labor contractions usually become progressively stronger and regular and they become more frequent.  Manage discomfort from Braxton Hicks contractions by changing position, resting in a warm bath, drinking plenty of water, or practicing deep breathing. This information is not intended to replace advice given to you by your health care provider. Make sure you discuss any questions you have with your health care provider. Document Released: 01/07/2005 Document Revised: 11/27/2015 Document Reviewed: 11/27/2015 Elsevier Interactive Patient Education  2017 [employer]  

## 2016-03-28 NOTE — Progress Notes (Signed)
   PRENATAL VISIT NOTE  Subjective:  Lisa Brown is a 26 y.o. G4P0030 at 3460w5d being seen today for ongoing prenatal care.  She is currently monitored for the following issues for this low-risk pregnancy and has Nausea/vomiting in pregnancy; Supervision of normal pregnancy, antepartum; BMI 36.0-36.9,adult; Obesity in pregnancy; and Marijuana use on her problem list.  Patient reports no complaints.  Contractions: Irritability. Vag. Bleeding: None.  Movement: Present. Denies leaking of fluid.   The following portions of the patient's history were reviewed and updated as appropriate: allergies, current medications, past family history, past medical history, past social history, past surgical history and problem list. Problem list updated.  Objective:   Vitals:   03/28/16 0900  BP: 128/79  Pulse: (!) 101  Weight: 269 lb 12.8 oz (122.4 kg)    Fetal Status: Fetal Heart Rate (bpm): 150 Fundal Height: 40 cm Movement: Present  Presentation: Vertex  General:  Alert, oriented and cooperative. Patient is in no acute distress.  Skin: Skin is warm and dry. No rash noted.   Cardiovascular: Normal heart rate noted  Respiratory: Normal respiratory effort, no problems with respiration noted  Abdomen: Soft, gravid, appropriate for gestational age. Pain/Pressure: Present     Pelvic:  Cervical exam performed Dilation: Fingertip Effacement (%): 60 Station: Ballotable  Extremities: Normal range of motion.  Edema: None  Mental Status: Normal mood and affect. Normal behavior. Normal judgment and thought content.   Assessment and Plan:  Pregnancy: G4P0030 at 4660w5d  1. Supervision of other normal pregnancy, antepartum - Waterbirth class completed. Certificate copied.  - Patient understands that she cannot have waterbirth if she is induced  Term labor symptoms and general obstetric precautions including but not limited to vaginal bleeding, contractions, leaking of fluid and fetal movement were reviewed in  detail with the patient. Please refer to After Visit Summary for other counseling recommendations.  Return in about 1 week (around 04/04/2016) for LOB.   Marny LowensteinJulie N Hinata Diener, PA-C

## 2016-04-03 ENCOUNTER — Ambulatory Visit: Payer: Self-pay

## 2016-04-03 ENCOUNTER — Ambulatory Visit (INDEPENDENT_AMBULATORY_CARE_PROVIDER_SITE_OTHER): Payer: Medicaid Other | Admitting: *Deleted

## 2016-04-03 ENCOUNTER — Telehealth: Payer: Self-pay | Admitting: *Deleted

## 2016-04-03 VITALS — BP 112/72 | HR 98

## 2016-04-03 DIAGNOSIS — O480 Post-term pregnancy: Secondary | ICD-10-CM | POA: Diagnosis present

## 2016-04-03 DIAGNOSIS — O368130 Decreased fetal movements, third trimester, not applicable or unspecified: Secondary | ICD-10-CM

## 2016-04-03 DIAGNOSIS — Z3689 Encounter for other specified antenatal screening: Secondary | ICD-10-CM

## 2016-04-03 NOTE — Telephone Encounter (Addendum)
Pt left message @ [phone] stating that she is having tightening and cramping of her abdomen this morning. She has not felt the baby moving during this tightening and wants to know if this is normal. I called pt back and left message stating that I need to speak with her regarding her message. I stated that we may need to see her in office today. I asked her to call back to the appt line and request to speak with me.   1440  Pt returned my call and we discussed her concerns. She stated that she has continued to have abdominal tightening throughout the day. The baby is moving a little but not as much as usual. I asked if she can come in now for NST and she agreed.

## 2016-04-03 NOTE — Progress Notes (Signed)
Pt reports decreased fetal movement today - felt good FM during NST. She also states that she has been having abdominal tightening and cramping. She was aware of some but not all UC's during NST. Fetal movement, tone and fetal breathing seen during AFI.  Pt advised to keep appt as scheduled tomorrow to discuss delivery plan of care w/provider. She has been planning water birth and does not want to be induced. She understands that twice weekly fetal testing would be needed until 42 weeks.

## 2016-04-04 ENCOUNTER — Encounter: Payer: Self-pay | Admitting: Obstetrics & Gynecology

## 2016-04-04 ENCOUNTER — Ambulatory Visit (INDEPENDENT_AMBULATORY_CARE_PROVIDER_SITE_OTHER): Payer: Medicaid Other | Admitting: Obstetrics & Gynecology

## 2016-04-04 VITALS — BP 117/74 | HR 77 | Wt 284.1 lb

## 2016-04-04 DIAGNOSIS — Z3483 Encounter for supervision of other normal pregnancy, third trimester: Secondary | ICD-10-CM | POA: Diagnosis not present

## 2016-04-04 DIAGNOSIS — O480 Post-term pregnancy: Secondary | ICD-10-CM | POA: Diagnosis present

## 2016-04-04 DIAGNOSIS — Z6836 Body mass index (BMI) 36.0-36.9, adult: Secondary | ICD-10-CM

## 2016-04-04 DIAGNOSIS — O99323 Drug use complicating pregnancy, third trimester: Secondary | ICD-10-CM | POA: Diagnosis not present

## 2016-04-04 DIAGNOSIS — Z3480 Encounter for supervision of other normal pregnancy, unspecified trimester: Secondary | ICD-10-CM

## 2016-04-04 DIAGNOSIS — F1290 Cannabis use, unspecified, uncomplicated: Secondary | ICD-10-CM

## 2016-04-04 NOTE — Progress Notes (Signed)
3/14 NST reviewed in the morning of 3/15. Tracing not reactive (1 15x15 accels and 10x 10 accels) but it is not non-reassuring. Patient is scheduled for routine Ob today. Case reviewed with MD scheduled to see her today

## 2016-04-04 NOTE — Progress Notes (Signed)
   PRENATAL VISIT NOTE  Subjective:  Lisa Brown is a 26 y.o. G4P0030 at 7795w5d being seen today for ongoing prenatal care.  She is currently monitored for the following issues for this high-risk pregnancy and has Nausea/vomiting in pregnancy; Supervision of normal pregnancy, antepartum; BMI 36.0-36.9,adult; Obesity in pregnancy; and Marijuana use on her problem list.  Patient reports no complaints.  Contractions: Irregular. Vag. Bleeding: None.  Movement: Present. Denies leaking of fluid.   The following portions of the patient's history were reviewed and updated as appropriate: allergies, current medications, past family history, past medical history, past social history, past surgical history and problem list. Problem list updated.  Objective:   Vitals:   04/04/16 1005  BP: 117/74  Pulse: 77  Weight: 284 lb 1.6 oz (128.9 kg)    Fetal Status: Fetal Heart Rate (bpm): 145   Movement: Present     General:  Alert, oriented and cooperative. Patient is in no acute distress.  Skin: Skin is warm and dry. No rash noted.   Cardiovascular: Normal heart rate noted  Respiratory: Normal respiratory effort, no problems with respiration noted  Abdomen: Soft, gravid, appropriate for gestational age. Pain/Pressure: Present     Pelvic:  Cervical exam performed      2-3/50/-3  Extremities: Normal range of motion.  Edema: None  Mental Status: Normal mood and affect. Normal behavior. Normal judgment and thought content.   Assessment and Plan:  Pregnancy: G4P0030 at 3695w5d  1. Post term pregnancy, antepartum condition or complication - Fetal nonstress test--REACTIVE  2. Supervision of other normal pregnancy, antepartum Continue 2x week testing with induction at 42 weeks.  Term labor symptoms and general obstetric precautions including but not limited to vaginal bleeding, contractions, leaking of fluid and fetal movement were reviewed in detail with the patient. Please refer to After Visit Summary  for other counseling recommendations.  Return in about 1 week (around 04/11/2016) for NST/AFI only.   Lesly DukesKelly H Callahan Wild, MD

## 2016-04-06 ENCOUNTER — Encounter (HOSPITAL_COMMUNITY): Payer: Self-pay

## 2016-04-06 ENCOUNTER — Inpatient Hospital Stay (HOSPITAL_COMMUNITY)
Admission: AD | Admit: 2016-04-06 | Discharge: 2016-04-08 | DRG: 775 | Disposition: A | Discharge status: Home / Self Care | Payer: Medicaid Other | Source: Ambulatory Visit | Attending: Obstetrics & Gynecology | Admitting: Obstetrics & Gynecology

## 2016-04-06 DIAGNOSIS — Z3493 Encounter for supervision of normal pregnancy, unspecified, third trimester: Secondary | ICD-10-CM | POA: Diagnosis present

## 2016-04-06 DIAGNOSIS — Z3480 Encounter for supervision of other normal pregnancy, unspecified trimester: Secondary | ICD-10-CM

## 2016-04-06 DIAGNOSIS — O480 Post-term pregnancy: Secondary | ICD-10-CM | POA: Diagnosis not present

## 2016-04-06 DIAGNOSIS — Z8249 Family history of ischemic heart disease and other diseases of the circulatory system: Secondary | ICD-10-CM | POA: Diagnosis not present

## 2016-04-06 DIAGNOSIS — J45909 Unspecified asthma, uncomplicated: Secondary | ICD-10-CM | POA: Diagnosis present

## 2016-04-06 DIAGNOSIS — O9952 Diseases of the respiratory system complicating childbirth: Secondary | ICD-10-CM | POA: Diagnosis present

## 2016-04-06 DIAGNOSIS — Z3A41 41 weeks gestation of pregnancy: Secondary | ICD-10-CM

## 2016-04-06 DIAGNOSIS — Z833 Family history of diabetes mellitus: Secondary | ICD-10-CM | POA: Diagnosis not present

## 2016-04-06 DIAGNOSIS — Z880 Allergy status to penicillin: Secondary | ICD-10-CM

## 2016-04-06 LAB — CBC
HEMATOCRIT: 34 % — AB (ref 36.0–46.0)
HEMOGLOBIN: 11.8 g/dL — AB (ref 12.0–15.0)
MCH: 28.9 pg (ref 26.0–34.0)
MCHC: 34.7 g/dL (ref 30.0–36.0)
MCV: 83.3 fL (ref 78.0–100.0)
Platelets: 227 10*3/uL (ref 150–400)
RBC: 4.08 MIL/uL (ref 3.87–5.11)
RDW: 15 % (ref 11.5–15.5)
WBC: 8.5 10*3/uL (ref 4.0–10.5)

## 2016-04-06 LAB — RPR: RPR: NONREACTIVE

## 2016-04-06 LAB — TYPE AND SCREEN
ABO/RH(D): O POS
ANTIBODY SCREEN: NEGATIVE

## 2016-04-06 MED ORDER — ONDANSETRON HCL 4 MG PO TABS: 4.0000 mg | ORAL_TABLET | ORAL | Status: DC | PRN

## 2016-04-06 MED ORDER — BENZOCAINE-MENTHOL 20-0.5 % EX AERO
1.0000 "application " | INHALATION_SPRAY | CUTANEOUS | Status: DC | PRN
  Administered 2016-04-07: 1 via TOPICAL
  Filled 2016-04-06: qty 56

## 2016-04-06 MED ORDER — COCONUT OIL OIL: 1.0000 "application " | TOPICAL_OIL | Status: DC | PRN

## 2016-04-06 MED ORDER — OXYTOCIN 40 UNITS IN LACTATED RINGERS INFUSION - SIMPLE MED
2.5000 [IU]/h | INTRAVENOUS | Status: DC
  Filled 2016-04-06: qty 1000

## 2016-04-06 MED ORDER — FLEET ENEMA 7-19 GM/118ML RE ENEM: 1.0000 | ENEMA | RECTAL | Status: DC | PRN

## 2016-04-06 MED ORDER — FENTANYL CITRATE (PF) 100 MCG/2ML IJ SOLN
100.0000 ug | INTRAMUSCULAR | Status: DC | PRN
  Administered 2016-04-06: 100 ug via INTRAVENOUS
  Filled 2016-04-06: qty 2

## 2016-04-06 MED ORDER — LIDOCAINE HCL (PF) 1 % IJ SOLN
30.0000 mL | INTRAMUSCULAR | Status: DC | PRN
  Administered 2016-04-06: 30 mL via SUBCUTANEOUS
  Filled 2016-04-06: qty 30

## 2016-04-06 MED ORDER — SIMETHICONE 80 MG PO CHEW: 80.0000 mg | CHEWABLE_TABLET | ORAL | Status: DC | PRN

## 2016-04-06 MED ORDER — DIPHENHYDRAMINE HCL 25 MG PO CAPS: 25.0000 mg | ORAL_CAPSULE | Freq: Four times a day (QID) | ORAL | Status: DC | PRN

## 2016-04-06 MED ORDER — DIBUCAINE 1 % RE OINT: 1.0000 "application " | TOPICAL_OINTMENT | RECTAL | Status: DC | PRN

## 2016-04-06 MED ORDER — IBUPROFEN 600 MG PO TABS
600.0000 mg | ORAL_TABLET | Freq: Four times a day (QID) | ORAL | Status: DC
  Administered 2016-04-06 – 2016-04-08 (×7): 600 mg via ORAL
  Filled 2016-04-06 (×7): qty 1

## 2016-04-06 MED ORDER — OXYTOCIN 10 UNIT/ML IJ SOLN
INTRAMUSCULAR | Status: AC
  Filled 2016-04-06: qty 1

## 2016-04-06 MED ORDER — FENTANYL CITRATE (PF) 100 MCG/2ML IJ SOLN: 50.0000 ug | INTRAMUSCULAR | Status: DC | PRN

## 2016-04-06 MED ORDER — ONDANSETRON HCL 4 MG/2ML IJ SOLN: 4.0000 mg | Freq: Four times a day (QID) | INTRAMUSCULAR | Status: DC | PRN

## 2016-04-06 MED ORDER — ONDANSETRON HCL 4 MG/2ML IJ SOLN: 4.0000 mg | INTRAMUSCULAR | Status: DC | PRN

## 2016-04-06 MED ORDER — ACETAMINOPHEN 325 MG PO TABS
650.0000 mg | ORAL_TABLET | ORAL | Status: DC | PRN
  Filled 2016-04-06: qty 2

## 2016-04-06 MED ORDER — OXYCODONE-ACETAMINOPHEN 5-325 MG PO TABS: 1.0000 | ORAL_TABLET | ORAL | Status: DC | PRN

## 2016-04-06 MED ORDER — SENNOSIDES-DOCUSATE SODIUM 8.6-50 MG PO TABS
2.0000 | ORAL_TABLET | ORAL | Status: DC
  Administered 2016-04-08: 2 via ORAL
  Filled 2016-04-06: qty 2

## 2016-04-06 MED ORDER — TETANUS-DIPHTH-ACELL PERTUSSIS 5-2.5-18.5 LF-MCG/0.5 IM SUSP
0.5000 mL | Freq: Once | INTRAMUSCULAR | Status: DC
  Filled 2016-04-06: qty 0.5

## 2016-04-06 MED ORDER — ZOLPIDEM TARTRATE 5 MG PO TABS: 5.0000 mg | ORAL_TABLET | Freq: Every evening | ORAL | Status: DC | PRN

## 2016-04-06 MED ORDER — ONDANSETRON 8 MG PO TBDP
8.0000 mg | ORAL_TABLET | Freq: Three times a day (TID) | ORAL | Status: DC | PRN
  Administered 2016-04-06: 8 mg via ORAL
  Filled 2016-04-06: qty 2
  Filled 2016-04-06: qty 1

## 2016-04-06 MED ORDER — OXYCODONE-ACETAMINOPHEN 5-325 MG PO TABS: 2.0000 | ORAL_TABLET | ORAL | Status: DC | PRN

## 2016-04-06 MED ORDER — WITCH HAZEL-GLYCERIN EX PADS: 1.0000 "application " | MEDICATED_PAD | CUTANEOUS | Status: DC | PRN

## 2016-04-06 MED ORDER — SOD CITRATE-CITRIC ACID 500-334 MG/5ML PO SOLN: 30.0000 mL | ORAL | Status: DC | PRN

## 2016-04-06 MED ORDER — PRENATAL MULTIVITAMIN CH
1.0000 | ORAL_TABLET | Freq: Every day | ORAL | Status: DC
  Administered 2016-04-07 – 2016-04-08 (×2): 1 via ORAL
  Filled 2016-04-06 (×2): qty 1

## 2016-04-06 MED ORDER — LACTATED RINGERS IV SOLN
500.0000 mL | INTRAVENOUS | Status: DC | PRN
  Administered 2016-04-06: 500 mL via INTRAVENOUS

## 2016-04-06 MED ORDER — OXYTOCIN BOLUS FROM INFUSION
500.0000 mL | Freq: Once | INTRAVENOUS | Status: AC
Start: 2016-04-06 — End: 2016-04-06
  Administered 2016-04-06: 500 mL via INTRAVENOUS

## 2016-04-06 MED ORDER — ACETAMINOPHEN 325 MG PO TABS
650.0000 mg | ORAL_TABLET | ORAL | Status: DC | PRN
  Administered 2016-04-07 – 2016-04-08 (×4): 650 mg via ORAL
  Filled 2016-04-06 (×3): qty 2

## 2016-04-06 NOTE — Progress Notes (Signed)
Labor Progress Note Lisa Brown is a 26 y.o. G4P0030 at 4558w0d presented for labor  S:  In tub, becoming more uncomfortable, feeling pressure. Requesting pain medication.  O:  BP 134/86   Pulse (!) 101   Temp 98.1 F (36.7 C) (Oral)   Resp 18   Ht 5\' 10"  (1.778 m)   Wt 128.8 kg (284 lb)   LMP 06/24/2015 (Exact Date)   BMI 40.75 kg/m  Intermittent EFM: baseline 140 bpm, no decels  Ctx: 2-3 SVE: Dilation: 7 Effacement (%): 90 Station: -1 Presentation: Vertex Exam by:: M.Elleah Hemsley, CNM   A/P: 26 y.o. G4P0030 6258w0d  1. Labor: active 2. FWB: reasurring 3. Pain: analgesics, anesthesia, NO prn Minimal cervical change (effacement). Will give pain meds. Reevaluate progress. Consider augmentation with Pitocin.  Anticipate SVD.  Lisa Brown, CNM 5:07 PM

## 2016-04-06 NOTE — Progress Notes (Addendum)
Assumed care of pt after receiving report from L&D nurse. Assessment done. VSS. See flow sheet for details. Nursery nurse at bs.   2300: assisted with b. Feeding. Infant to breast with minimal assist. Instructed pt to b.feed infant for 20-30 mins on each side. Pt verbalized understanding.

## 2016-04-06 NOTE — Lactation Note (Signed)
This note was copied from a baby's chart. Lactation Consultation Note Initial visit at 4 hours of age.  Mom has water birth delivery and reports baby has been latching well.  [facility]  Baby is STS on mom and she reports RN assisted with recent feeding. [facility] resources given and discussed.  Encouraged to feed with early cues on demand.  Early newborn behavior discussed.  Hand expression demonstrated with colostrum visible. Colostrum containers given and encouraged spoon feeding as needed.  Mom requested baby to be placed in crib.   Mom to call for assist as needed.   Patient Name: Lisa Brown Today's Date: 04/06/2016 Reason for consult: Initial assessment   Maternal Data Has patient been taught Hand Expression?: Yes Does the patient have breastfeeding experience prior to this delivery?: No  Feeding Feeding Type: Breast Fed Length of feed: 10 min  LATCH Score/Interventions Latch: Grasps breast easily, tongue down, lips flanged, rhythmical sucking.     Type of Nipple: Everted at rest and after stimulation  Comfort (Breast/Nipple): Soft / non-tender     Hold (Positioning): Assistance needed to correctly position infant at breast and maintain latch. Intervention(s): Breastfeeding basics reviewed     Lactation Tools Discussed/Used WIC Program: No   Consult Status Consult Status: Follow-up Date: 04/07/16 Follow-up type: In-patient    Jannifer RodneyShoptaw, Sevannah Madia Lynn 04/06/2016, 11:41 PM

## 2016-04-06 NOTE — MAU Note (Signed)
MAU triage note: went to doctor Thursday was 2cm and had membranes stripped. Contractions getting stronger. Baby is active.

## 2016-04-06 NOTE — H&P (Signed)
Novella Olivember V Cabral is a 26 y.o. female G4P0030 with IUP at 6226w0d presenting for SOL. Pt states she has been having regular, every 3-4 minutes contractions, associated with none vaginal bleeding for 5 hours..  Membranes are intact, with active fetal movement.   [facility]  Prenatal History/Complications:  Past Medical History: Past Medical History:  Diagnosis Date  . Asthma     Past Surgical History: Past Surgical History:  Procedure Laterality Date  . NO PAST SURGERIES      Obstetrical History: OB History    Gravida Para Term Preterm AB Living   4 0 0 0 3 0   SAB TAB Ectopic Multiple Live Births   1 2 0           Social History: Social History   Social History  . Marital status: Single    Spouse name: N/A  . Number of children: N/A  . Years of education: N/A   Social History Main Topics  . Smoking status: Never Smoker  . Smokeless tobacco: Never Used  . Alcohol use No  . Drug use: No  . Sexual activity: Yes    Birth control/ protection: None   Other Topics Concern  . Not on file   Social History Narrative  . No narrative on file    Family History: Family History  Problem Relation Age of Onset  . Diabetes Mother   . Diabetes Father   . Kidney disease Father   . Hypertension Father   . Liver disease Father   . Diabetes Maternal Grandmother   . Heart disease Maternal Grandmother   . Diabetes Maternal Grandfather     Allergies: No Known Allergies  No prescriptions prior to admission.   Review of Systems   Constitutional: Negative for fever and chills Eyes: Negative for visual disturbances Respiratory: Negative for shortness of breath, dyspnea Cardiovascular: Negative for chest pain or palpitations  Gastrointestinal: Negative for vomiting, diarrhea and constipation.  POSITIVE for abdominal pain (contractions) Genitourinary: Negative for dysuria and urgency Musculoskeletal: Negative for back pain, joint pain, myalgias  Neurological:  Negative for dizziness and headaches   Last menstrual period 06/24/2015. General appearance: alert, cooperative and no distress Lungs: clear to auscultation bilaterally Heart: regular rate and rhythm Abdomen: soft, non-tender; bowel sounds normal Extremities: Homans sign is negative, no sign of DVT DTR's 2+ Presentation: cephalic Fetal monitoring  Baseline: 125 bpm, Variability: Good {> 6 bpm), Accelerations: Reactive and Decelerations: Absent Uterine activity  Frequency: Every 3-4 minutes and Intensity: moderate Dilation: 4 Effacement (%): 80 Station: -2 Exam by:: Steward DroneVeronica Rogers CNM Student   [facility]  Prenatal Labs  Dating  LMP; consistent w 6 wk ultrasound Blood type: O/POS/-- (08/30 1056)   Genetic Screen 1 Screen: NT nml   AFP: neg Quad:     NIPS: Antibody:NEG (08/30 1056)  Anatomic US  neg Rubella: 1.42 (08/30 1056)  GTT Early: 91     Third trimester: Normal 2 hr GTT RPR: NON REAC (12/22 0921)   Flu vaccine  10/18/15 HBsAg: NEGATIVE (08/30 1056)   TDaP vaccine   01/12/16                         HIV: NONREACTIVE (12/22 0919)   Baby Food  Breast  GBS: neg (For PCN allergy, check sensitivities)Neg  Contraception undecided Pap: Negative 2017  Circumcision N/a  +marijuana  Pediatrician Given list    Support Person  Annabelle Harman (mother)     Prenatal Transfer Tool  Maternal Diabetes: No Genetic Screening: Normal Maternal Ultrasounds/Referrals: Normal Fetal Ultrasounds or other Referrals:  None Maternal Substance Abuse:  Yes:  Type: Marijuana Significant Maternal Medications:  None Significant Maternal Lab Results: Lab values include: Group B Strep negative     No results found for this or any previous visit (from the past 24 hour(s)).  Assessment: VEDIKA DUMLAO is a 26 y.o. G4P0030 with an IUP at [redacted]w[redacted]d presenting for SOL  Plan: #Labor: expectant management #Pain:  Per request #FWB Cat 1 #ID: GBS: Negative   #MOF:  Breast #MOC: Unsure   Steward Drone BSN, SNM 04/06/2016, 4:30 AM   CNM attestation:  I have seen and examined this patient; I agree with above documentation in the midwife student's note.   KAMSIYOCHUKWU BUIST is a 26 y.o. [phone] here for reg ctx/latent labor; [facility] has been essentially unremarkable; GBS neg  PE: BP 103/69 (BP Location: Right Arm)   Pulse 73   Temp 97.5 F (36.4 C) (Oral)   Resp 16   Ht 5\' 10"  (1.778 m)   Wt 128.8 kg (284 lb)   LMP 06/24/2015 (Exact Date)   SpO2 99%   Breastfeeding? Unknown   BMI 40.75 kg/m  Gen: breathing w/ ctx Resp: normal effort, no distress Abd: gravid  ROS, labs, PMH reviewed  Plan: Admit to [employer] Expectant management- desires waterbirth Plan to check cx in 3+ hrs or sooner prn for progress Anticipate SVD  Cam Hai CNM 04/10/2016 9:13 PM

## 2016-04-06 NOTE — Progress Notes (Signed)
Pt OOB to bathroom following tub birth. Steady gait noted. Pt unable to void.  Peri care demonstrated and performed by pt without difficulty. Pads and ice pack to perineum.  Pt to PP for continuation of care.

## 2016-04-06 NOTE — Progress Notes (Signed)
Labor Progress Note Lisa Brown is a 26 y.o. G4P0030 at 7228w0d presented for labor  S:  Breathing with each ctx, sitting on side of bed. Out of tub recently.  O:  BP 138/83   Pulse 84   Temp 97.3 F (36.3 C) (Oral)   Resp 18   Ht 5\' 10"  (1.778 m)   Wt 128.8 kg (284 lb)   LMP 06/24/2015 (Exact Date)   BMI 40.75 kg/m  Intermittent EFM: baseline 140 bpm Toco: 2-3 (per pt) SVE: Dilation: 7 Effacement (%): 80 Station: -1, 0 Presentation: Vertex Exam by:: Henderson NewcomerStephanie Faulk, RN   A/P: 26 y.o. G4P0030 5728w0d  1. Labor: active 2. FWB: reassuring 3. Pain: water immersion, labor support Anticipate labor progression and SVD.  Donette LarryMelanie Lauranne Beyersdorf, CNM 2:29 PM

## 2016-04-06 NOTE — Progress Notes (Signed)
Labor Progress Note Lisa Brown is a 26 y.o. G4P0030 at 147w0d presented for labor  S:  Comfortable, feeling ctx about q5 min.  O:  BP (!) 115/58   Pulse 75   Temp 97.8 F (36.6 C) (Oral)   Resp 18   Ht 5\' 10"  (1.778 m)   Wt 128.8 kg (284 lb)   LMP 06/24/2015 (Exact Date)   BMI 40.75 kg/m  EFM: baseline 130 bpm/ mod variability/ + accels/ no decels  Toco: 2-5 SVE: 5/70/-2, AROM clear abundant  A/P: 26 y.o. G4P0030 167w0d  1. Labor: protracted 2. FWB: Cat I 3. Pain: labor support, water immesion  Discussed with patient protracted labor, options for augmentation would only include AROM if she desires water immersion. Other options-Pitocin would exclude ability to labor in tub. Pt agrees to  AROM.  Will allow time for labor progression. Discussed the possibility of AROM proving ineffective and the need for Pitocin at some point. Pt understands. Anticipate labor progression and SVD.  Donette LarryMelanie Amar Keenum, CNM 10:24 AM

## 2016-04-07 NOTE — Progress Notes (Signed)
Post Partum Day #1 Subjective: no complaints, up ad lib, voiding, tolerating PO and reports normal lochia  Objective: Blood pressure 114/65, pulse 93, temperature 98 F (36.7 C), temperature source Oral, resp. rate 16, height 5\' 10"  (1.778 m), weight 128.8 kg (284 lb), last menstrual period 06/24/2015, SpO2 100 %, unknown if currently breastfeeding.  Physical Exam:  General: alert Lochia: appropriate Uterine Fundus: firm and NT at U-2 DVT Evaluation: No evidence of DVT seen on physical exam.   Recent Labs  04/06/16 0405  HGB 11.8*  HCT 34.0*    Assessment/Plan: Plan for discharge tomorrow   LOS: 1 day   Beldon Nowling C Kylee Nardozzi 04/07/2016, 7:46 AM

## 2016-04-07 NOTE — Clinical Social Work Maternal (Signed)
  CLINICAL SOCIAL WORK MATERNAL/CHILD NOTE  Patient Details  Name: Lisa Brown MRN: [id number] Date of Birth: 1991-01-10  Date:  04/07/2016  Clinical Social Worker Initiating Note:  Ferdinand Lango Edmund Holcomb, MSW, LCSW-A  Date/ Time Initiated:  04/07/16/1300     Child's Name:  Jovita Kussmaul    Legal Guardian:  Other (Comment) (Not established by court system; MOB and FOB Brion Aliment) parent collectively )   Need for Interpreter:  None   Date of Referral:  04/06/16     Reason for Referral:  Current Substance Use/Substance Use During Pregnancy    Referral Source:  [facility]   Address:  [place] [address]  Phone number:  [phone]   Household Members:  Self, Significant Other   Natural Supports (not living in the home):  Parent, Friends, Extended Family   Professional Supports: None   Employment: Full-time   Type of Work: Unknown at this time.    Education:  9 to 11 years   Financial Resources:  Medicaid   Other Resources:      Cultural/Religious Considerations Which May Impact Care:  None reported.   Strengths:  Ability to meet basic needs , Home prepared for child , Compliance with medical plan , Pediatrician chosen  [facility] )   Risk Factors/Current Problems:  Substance Use    Cognitive State:  Alert , Able to Concentrate , Insightful , Goal Oriented    Mood/Affect:  Calm , Comfortable , Interested    CSW Assessment: CSW met with MOB at bedside to complete assessment for consult regarding hx of THC use. Upon this writers arrival, MOB was laying in bed awake with baby resting on her chest. This Probation officer explained role and reasoning for visit. MOB was warm and welcoming of this Probation officer. MOB noted she anticipated me coming to see her given that she was asked a few times about her one positive result for THC at the beginning of her pregnancy when she was unaware that she was pregnant. CSW informed MOB of the hospitals policy and procedure  regarding substance and mandatory report making for positive test results. MOB verbalized understanding noting she has done her own research on the topic because she was nervous what the affects of THC would have on her baby. MOB notes she used [facility] recreationally before she found out she was pregnant but once she found out she was she stopped. CSW informed MOB that UDS goes back 3 days from the day it is taken and the CDS goes back 3 months. MOB noted she has not smoked since the beginning of august prior to finding out about pregnancy thus has feels both rest should come back negative. CSW informed MOB that when results come back, if (+) we will make a report to [facility] DSS-CPS but first also notify her of the (+) results. MOB verbalized understanding noting she has no additional concerns. CSW will continue to follow pending UDS and CDS results.   CSW Plan/Description:  Information/Referral to [employer] , [employer] , No Further Intervention Required/No Barriers to Discharge, Other (Comment) (CSW will continue to follow for pending UDS and CDS results; if (+) will make a report to [facility] dss-cps)    Water quality scientist, MSW, [facility]  Office: [phone]

## 2016-04-08 ENCOUNTER — Other Ambulatory Visit: Payer: Medicaid Other | Admitting: Obstetrics & Gynecology

## 2016-04-08 MED ORDER — IBUPROFEN 600 MG PO TABS
600.0000 mg | ORAL_TABLET | Freq: Four times a day (QID) | ORAL | 0 refills | Status: DC
Start: 2016-04-08 — End: 2020-12-04

## 2016-04-08 MED ORDER — PRENATAL VITAMINS 0.8 MG PO TABS: 1.0000 | ORAL_TABLET | Freq: Every day | ORAL | 12 refills | Status: DC

## 2016-04-08 NOTE — Discharge Summary (Signed)
OB Discharge Summary  Patient Name: Lisa Brown DOB: [birth year] MRN: [id number]  Date of admission: 04/06/2016 Delivering MD: Donette Larry   Date of discharge: 04/08/2016  Admitting diagnosis: IN LABOR Intrauterine pregnancy: [redacted]w[redacted]d     Secondary diagnosis:Active Problems:   Indication for care in labor or delivery      Discharge diagnosis: Term Pregnancy Delivered                                                                     Post partum procedures:none  Augmentation: AROM  Complications: None  Hospital course:  Onset of Labor With Vaginal Delivery     26 y.o. yo [id number] at [redacted]w[redacted]d was admitted in Active Labor on 04/06/2016. Patient had an uncomplicated labor course as follows:  Membrane Rupture Time/Date: 10:19 AM ,04/06/2016   Intrapartum Procedures: Episiotomy: None [1]                                         Lacerations:  1st degree [2] labial Patient had a delivery of a Viable infant. 04/06/2016  Information for the patient's newborn:  Lisa Brown [[id number]]  Delivery Method: Vaginal, Spontaneous Delivery (Filed from Delivery Summary)    Pateint had an uncomplicated postpartum course.  She is ambulating, tolerating a regular diet, passing flatus, and urinating well. Patient is discharged home in stable condition on 04/08/16.   Physical exam  Vitals:   04/07/16 1000 04/07/16 1800 04/08/16 0528 04/08/16 0532  BP: 123/73 121/68 (!) 91/41 103/69  Pulse: 85 92 73 73  Resp: 16 19 16    Temp: 97.8 F (36.6 C) 98.2 F (36.8 C) 97.5 F (36.4 C)   TempSrc: Oral Oral Oral   SpO2:  99%    Weight:      Height:       General: alert Lochia: appropriate Uterine Fundus: firm DVT Evaluation: No evidence of DVT seen on physical exam. Labs: Lab Results  Component Value Date   WBC 8.5 04/06/2016   HGB 11.8 (L) 04/06/2016   HCT 34.0 (L) 04/06/2016   MCV 83.3 04/06/2016   PLT 227 04/06/2016   CMP Latest Ref Rng & Units 12/21/2015  Glucose 65 - 99 mg/dL  130(Q)  BUN 6 - 20 mg/dL 9  Creatinine 6.57 - 8.46 mg/dL 9.62  Sodium 952 - 841 mmol/L 135  Potassium 3.5 - 5.1 mmol/L 4.2  Chloride 101 - 111 mmol/L 105  CO2 22 - 32 mmol/L 24  Calcium 8.9 - 10.3 mg/dL 9.3  Total Protein 6.5 - 8.1 g/dL 7.2  Total Bilirubin 0.3 - 1.2 mg/dL 0.3  Alkaline Phos 38 - 126 U/L 44  AST 15 - 41 U/L 17  ALT 14 - 54 U/L 14    Discharge instruction: per After Visit Summary and "Baby and Me Booklet".  After Visit Meds:  Allergies as of 04/08/2016   No Known Allergies     Medication List    TAKE these medications   ibuprofen 600 MG tablet Commonly known as:  ADVIL,MOTRIN Take 1 tablet (600 mg total) by mouth every 6 (six) hours.   Prenatal Vitamins 0.8 MG  tablet Take 1 tablet by mouth daily.       Diet: routine diet  Activity: Advance as tolerated. Pelvic rest for 6 weeks.   Outpatient follow up:6 weeks Follow up Appt:Future Appointments Date Time Provider Department Center  04/08/2016 7:40 AM Lisa DukesKelly H Leggett, MD [facility]   Follow up visit: No Follow-up on file.  Postpartum contraception: Abstinence  Newborn Data: Live born female  Birth Weight: 8 lb 0.2 oz (3635 g) APGAR: 8, 9  Baby Feeding: Breast Disposition:home with mother   04/08/2016 Lisa BossierMyra C Novali Vollman, MD

## 2016-04-08 NOTE — Discharge Instructions (Signed)
Contraception Choices °Contraception, also called birth control, means things to use or ways to try not to get pregnant. °Hormonal birth control °This kind of birth control uses hormones. Here are some types of hormonal birth control: °· A tube that is put under skin of the arm (implant). The tube can stay in for as long as 3 years. °· Shots to get every 3 months (injections). °· Pills to take every day (birth control pills). °· A patch to change 1 time each week for 3 weeks (birth control patch). After that, the patch is taken off for 1 week. °· A ring to put in the vagina. The ring is left in for 3 weeks. Then it is taken out of the vagina for 1 week. Then a new ring is put in. °· Pills to take after unprotected sex (emergency birth control pills). ° °Barrier birth control °Here are some types of barrier birth control: °· A thin covering that is put on the penis before sex (female condom). The covering is thrown away after sex. °· A soft, loose covering that is put in the vagina before sex (female condom). The covering is thrown away after sex. °· A rubber bowl that sits over the cervix (diaphragm). The bowl must be made for you. The bowl is put into the vagina before sex. The bowl is left in for 6-8 hours after sex. It is taken out within 24 hours. °· A small, soft cup that fits over the cervix (cervical cap). The cup must be made for you. The cup can be left in for 6-8 hours after sex. It is taken out within 48 hours. °· A sponge that is put into the vagina before sex. It must be left in for at least 6 hours after sex. It must be taken out within 30 hours. Then it is thrown away. °· A chemical that kills or stops sperm from getting into the uterus (spermicide). It may be a pill, cream, jelly, or foam to put in the vagina. The chemical should be used at least 10-15 minutes before sex. ° °IUD (intrauterine) birth control °An IUD is a small, T-shaped piece of plastic. It is put inside the uterus. There are two  kinds: °· Hormone IUD. This kind can stay in for 3-5 years. °· Copper IUD. This kind can stay in for 10 years. ° °Permanent birth control °Here are some types of permanent birth control: °· Surgery to block the fallopian tubes. °· Having an insert put into each fallopian tube. °· Surgery to tie off the tubes that carry sperm (vasectomy). ° °Natural planning birth control °Here are some types of natural planning birth control: °· Not having sex on the days the woman could get pregnant. °· Using a calendar: °? To keep track of the length of each period. °? To find out what days pregnancy can happen. °? To plan to not have sex on days when pregnancy can happen. °· Watching for symptoms of ovulation and not having sex during ovulation. One way the woman can check for ovulation is to check her temperature. °· Waiting to have sex until after ovulation. ° °Summary °· Contraception, also called birth control, means things to use or ways to try not to get pregnant. °· Hormonal methods of birth control include implants, injections, pills, patches, vaginal rings, and emergency birth control pills. °· Barrier methods of birth control can include female condoms, female condoms, diaphragms, cervical caps, sponges, and spermicides. °· There are two types of   IUD (intrauterine device) birth control. An IUD can be put in a woman's uterus to prevent pregnancy for 3-5 years. °· Permanent sterilization can be done through a procedure for males, females, or both. °· Natural planning methods involve not having sex on the days when the woman could get pregnant. °This information is not intended to replace advice given to you by your health care provider. Make sure you discuss any questions you have with your health care provider. °Document Released: 11/04/2008 Document Revised: 01/18/2016 Document Reviewed: 01/18/2016 °Elsevier Interactive Patient Education © 2017 [employer] Inc[employer] °Home Care Instructions for Mom °ACTIVITY °· Gradually return to  your regular activities. °· Let yourself rest. Nap while your baby sleeps. °· Avoid lifting anything that is heavier than 10 lb (4.5 kg) until your health care provider says it is okay. °· Avoid activities that take a lot of effort and energy (are strenuous) until approved by your health care provider. Walking at a slow-to-moderate pace is usually safe. °· If you had a cesarean delivery: °? Do not vacuum, climb stairs, or drive a car for 4-6 weeks. °? Have someone help you at home until you feel like you can do your usual activities yourself. °? Do exercises as told by your health care provider, if this applies. ° °VAGINAL BLEEDING °You may continue to bleed for 4-6 weeks after delivery. Over time, the amount of blood usually decreases and the color of the blood usually gets lighter. However, the flow of bright red blood may increase if you have been too active. If you need to use more than one pad in an hour because your pad gets soaked, or if you pass a large clot: °· Lie down. °· Raise your feet. °· Place a cold compress on your lower abdomen. °· Rest. °· Call your health care provider. ° °If you are breastfeeding, your period should return anytime between 8 weeks after delivery and the time that you stop breastfeeding. If you are not breastfeeding, your period should return 6-8 weeks after delivery. °PERINEAL CARE °The perineal area, or perineum, is the part of your body between your thighs. After delivery, this area needs special care. Follow these instructions as told by your health care provider. °· Take warm tub baths for 15-20 minutes. °· Use medicated pads and pain-relieving sprays and creams as told. °· Do not use tampons or douches until vaginal bleeding has stopped. °· Each time you go to the bathroom: °? Use a peri bottle. °? Change your pad. °? Use towelettes in place of toilet paper until your stitches have healed. °· Do Kegel exercises every day. Kegel exercises help to maintain the muscles that  support the vagina, bladder, and bowels. You can do these exercises while you are standing, sitting, or lying down. To do Kegel exercises: °? Tighten the muscles of your abdomen and the muscles that surround your birth canal. °? Hold for a few seconds. °? Relax. °? Repeat until you have done this 5 times in a row. °· To prevent hemorrhoids from developing or getting worse: °? Drink enough fluid to keep your urine clear or pale yellow. °? Avoid straining when having a bowel movement. °? Take over-the-counter medicines and stool softeners as told by your health care provider. ° °BREAST CARE °· Wear a tight-fitting bra. °· Avoid taking over-the-counter pain medicine for breast discomfort. °· Apply ice to the breasts to help with discomfort as needed: °? Put ice in a plastic bag. °? Place a towel between your   skin and the bag. °? Leave the ice on for 20 minutes or as told by your health care provider. ° °NUTRITION °· Eat a well-balanced diet. °· Do not try to lose weight quickly by cutting back on calories. °· Take your prenatal vitamins until your postpartum checkup or until your health care provider tells you to stop. ° °POSTPARTUM DEPRESSION °You may find yourself crying for no apparent reason and unable to cope with all of the changes that come with having a newborn. This mood is called postpartum depression. Postpartum depression happens because your hormone levels change after delivery. If you have postpartum depression, get support from your partner, friends, and family. If the depression does not go away on its own after several weeks, contact your health care provider. °BREAST SELF-EXAM °Do a breast self-exam each month, at the same time of the month. If you are breastfeeding, check your breasts just after a feeding, when your breasts are less full. If you are breastfeeding and your period has started, check your breasts on day 5, 6, or 7 of your period. °Report any lumps, bumps, or discharge to your health  care provider. Know that breasts are normally lumpy if you are breastfeeding. This is temporary, and it is not a health risk. °INTIMACY AND SEXUALITY °Avoid sexual activity for at least 3-4 weeks after delivery or until the brownish-red vaginal flow is completely gone. If you want to avoid pregnancy, use some form of birth control. You can get pregnant after delivery, even if you have not had your period. °SEEK MEDICAL CARE IF: °· You feel unable to cope with the changes that a child brings to your life, and these feelings do not go away after several weeks. °· You notice a lump, a bump, or discharge on your breast. ° °SEEK IMMEDIATE MEDICAL CARE IF: °· Blood soaks your pad in 1 hour or less. °· You have: °? Severe pain or cramping in your lower abdomen. °? A bad-smelling vaginal discharge. °? A fever that is not controlled by medicine. °? A fever, and an area of your breast is red and sore. °? Pain or redness in your calf. °? Sudden, severe chest pain. °? Shortness of breath. °? Painful or bloody urination. °? Problems with your vision. °· You vomit for 12 hours or longer. °· You develop a severe headache. °· You have serious thoughts about hurting yourself, your child, or anyone else. ° °This information is not intended to replace advice given to you by your health care provider. Make sure you discuss any questions you have with your health care provider. °Document Released: 01/05/2000 Document Revised: 06/15/2015 Document Reviewed: 07/11/2014 °Elsevier Interactive Patient Education © 2017 Elsevier Inc. ° °

## 2016-04-17 ENCOUNTER — Encounter: Payer: Medicaid Other | Admitting: Advanced Practice Midwife

## 2016-05-13 ENCOUNTER — Encounter: Payer: Self-pay | Admitting: Family Medicine

## 2016-05-13 ENCOUNTER — Encounter: Payer: Self-pay | Admitting: *Deleted

## 2016-05-13 ENCOUNTER — Ambulatory Visit (INDEPENDENT_AMBULATORY_CARE_PROVIDER_SITE_OTHER): Payer: Medicaid Other | Admitting: Family Medicine

## 2016-05-13 DIAGNOSIS — Z3009 Encounter for other general counseling and advice on contraception: Secondary | ICD-10-CM

## 2016-05-13 NOTE — Progress Notes (Signed)
Subjective:     Lisa Brown is a 26 y.o. female who presents for a postpartum visit. She is 4 weeks postpartum following a spontaneous vaginal delivery. I have fully reviewed the prenatal and intrapartum course. The delivery was at 41 gestational weeks. Outcome: spontaneous vaginal delivery. Anesthesia: local. Postpartum course has been uncomplicated. Baby's course has been uncomplicated. Baby is feeding by breast. Bleeding no bleeding. Bowel function is normal. Bladder function is normal. Patient is not sexually active. Contraception method is IUD to be placed in 2 weeks. Postpartum depression screening: negative.  The following portions of the patient's history were reviewed and updated as appropriate: allergies, current medications, past family history, past medical history, past social history, past surgical history and problem list.  Review of Systems Pertinent items are noted in HPI.   Objective:    BP 119/88   Pulse 63   Wt 248 lb (112.5 kg)   BMI 35.58 kg/m   General:  alert, cooperative, appears stated age and no distress  Lungs: clear to auscultation bilaterally  Heart:  regular rate and rhythm, S1, S2 normal, no murmur, click, rub or gallop  Abdomen: soft, non-tender; bowel sounds normal; no masses,  no organomegaly       Refuses pelvic exam, will return in 2 weeks for IUD placement No breast concerns, refuses breast exam, had done previously.  Assessment:   4 weeks postpartum exam. Pap smear done 08/2015.   Plan:    1. Contraception: IUD 2. Pap to be repeated in 2020 3. Follow up in: 2 weeks for IUD insertion or as needed.

## 2016-05-13 NOTE — Patient Instructions (Signed)
Intrauterine Device Information An intrauterine device (IUD) is inserted into your uterus to prevent pregnancy. There are two types of IUDs available:  Copper IUD-This type of IUD is wrapped in copper wire and is placed inside the uterus. Copper makes the uterus and fallopian tubes produce a fluid that kills sperm. The copper IUD can stay in place for 10 years.  Hormone IUD-This type of IUD contains the hormone progestin (synthetic progesterone). The hormone thickens the cervical mucus and prevents sperm from entering the uterus. It also thins the uterine lining to prevent implantation of a fertilized egg. The hormone can weaken or kill the sperm that get into the uterus. One type of hormone IUD can stay in place for 5 years, and another type can stay in place for 3 years. Your health care provider will make sure you are a good candidate for a contraceptive IUD. Discuss with your health care provider the possible side effects. Advantages of an intrauterine device  IUDs are highly effective, reversible, long acting, and low maintenance.  There are no estrogen-related side effects.  An IUD can be used when breastfeeding.  IUDs are not associated with weight gain.  The copper IUD works immediately after insertion.  The hormone IUD works right away if inserted within 7 days of your period starting. You will need to use a backup method of birth control for 7 days if the hormone IUD is inserted at any other time in your cycle.  The copper IUD does not interfere with your female hormones.  The hormone IUD can make heavy menstrual periods lighter and decrease cramping.  The hormone IUD can be used for 3 or 5 years.  The copper IUD can be used for 10 years. Disadvantages of an intrauterine device  The hormone IUD can be associated with irregular bleeding patterns.  The copper IUD can make your menstrual flow heavier and more painful.  You may experience cramping and vaginal bleeding after  insertion. This information is not intended to replace advice given to you by your health care provider. Make sure you discuss any questions you have with your health care provider. Document Released: 12/12/2003 Document Revised: 06/15/2015 Document Reviewed: 06/28/2012 Elsevier Interactive Patient Education  2017 Else[employer] Inc.    Intrauterine Device Insertion An intrauterine device (IUD) is a medical device that gets inserted into the uterus to prevent pregnancy. It is a small, T-shaped device that has one or two nylon strings hanging down from it. The strings hang out of the lower part of the uterus (cervix) to allow for future IUD removal. There are two types of IUDs available:  Copper IUD. This type of IUD has copper wire wrapped around it. Copper makes the uterus and fallopian tubes produce a fluid that kills sperm. A copper IUD may last up to 10 years.  Hormone IUD. This type of IUD is made of plastic and contains the hormone progestin (synthetic progesterone). The hormone thickens mucus in the cervix and prevents sperm from entering the uterus. It also thins the uterine lining to prevent implantation of a fertilized egg. The hormone can weaken or kill the sperm that get into the uterus. A hormone IUD may last 3-5 years. Tell a health care provider about:  Any allergies you have.  All medicines you are taking, including vitamins, herbs, eye drops, creams, and over-the-counter medicines.  Any problems you or family members have had with anesthetic medicines.  Any blood disorders you have.  Any surgeries you have had.  Any  medical conditions you have, including any STIs (sexually transmitted infections) you may have.  Whether you are pregnant or may be pregnant. What are the risks? Generally, this is a safe procedure. However, problems may occur, including:  Infection.  Bleeding.  Allergic reactions to medicines.  Accidental puncture (perforation) of the uterus, or damage to  other structures or organs.  Accidental placement of the IUD either in the muscle layer of the uterus (myometrium) or outside the uterus.  The IUD falling out of the uterus (expulsion). This is more common among women who have recently had a child.  Pregnancy that happens in the fallopian tube (ectopic pregnancy).  Infection of the uterus and fallopian tubes (pelvic inflammatory disease). What happens before the procedure?  Schedule the IUD insertion for when you will have your menstrual period or right after, to make sure you are not pregnant. Placement of the IUD is better tolerated shortly after a menstrual cycle.  Follow instructions from your health care provider about eating or drinking restrictions.  Ask your health care provider about changing or stopping your regular medicines. This is especially important if you are taking diabetes medicines or blood thinners.  You may get a pain reliever to take before the procedure.  You may have tests for:  Pregnancy. A pregnancy test involves having a urine sample taken.  STIs. Placing an IUD in someone who has an STI can make the infection worse.  Cervical cancer. You may have a Pap test to check for this type of cancer. This means collecting cells from your cervix to be examined under a microscope.  You may have a physical exam to determine the size and position of your uterus. The procedure may vary among health care providers and hospitals. What happens during the procedure?  A tool (speculum) will be placed in your vagina and widened so that your health care provider can see your cervix.  Medicine may be applied to your cervix to help lower your risk of infection (antiseptic medicine).  You may be given an anesthetic medicine to numb each side of your cervix (intracervical block or paracervical block). This medicine is usually given by an injection into the cervix.  A tool (uterine sound) will be inserted into your uterus to  determine the length of your uterus and the direction that your uterus may be tilted.  A slim instrument or tube (IUD inserter) that holds the IUD will be inserted into your vagina, through your cervical canal, and into your uterus.  The IUD will be placed in the uterus, and the IUD inserter will be removed.  The strings that are attached to the IUD will be trimmed so that they lie just below the cervix. The procedure may vary among health care providers and hospitals. What happens after the procedure?  You may have bleeding after the procedure. This is normal. It varies from light bleeding (spotting) for a few days to menstrual-like bleeding.  You may have cramping and pain.  You may feel dizzy or light-headed.  You may have lower back pain. Summary  An intrauterine device (IUD) is a small, T-shaped device that has one or two nylon strings hanging down from it.  Two types of IUDs are available. You may have a copper IUD or a hormone IUD.  Schedule the IUD insertion for when you will have your menstrual period or right after, to make sure you are not pregnant. Placement of the IUD is better tolerated shortly after a menstrual cycle.  You may have bleeding after the procedure. This is normal. It varies from light spotting for a few days to menstrual-like bleeding. This information is not intended to replace advice given to you by your health care provider. Make sure you discuss any questions you have with your health care provider. Document Released: 09/05/2010 Document Revised: 11/29/2015 Document Reviewed: 11/29/2015 Elsevier Interactive Patient Education  2017 [employer].

## 2016-05-27 ENCOUNTER — Encounter: Payer: Self-pay | Admitting: Family Medicine

## 2016-05-27 ENCOUNTER — Ambulatory Visit (INDEPENDENT_AMBULATORY_CARE_PROVIDER_SITE_OTHER): Payer: Medicaid Other | Admitting: Family Medicine

## 2016-05-27 VITALS — BP 130/85 | HR 66 | Ht 71.0 in | Wt 247.0 lb

## 2016-05-27 DIAGNOSIS — Z3049 Encounter for surveillance of other contraceptives: Secondary | ICD-10-CM | POA: Diagnosis not present

## 2016-05-27 DIAGNOSIS — Z30017 Encounter for initial prescription of implantable subdermal contraceptive: Secondary | ICD-10-CM

## 2016-05-27 DIAGNOSIS — Z3009 Encounter for other general counseling and advice on contraception: Secondary | ICD-10-CM

## 2016-05-27 LAB — POCT PREGNANCY, URINE: Preg Test, Ur: NEGATIVE

## 2016-05-27 MED ORDER — ETONOGESTREL 68 MG ~~LOC~~ IMPL
68.0000 mg | DRUG_IMPLANT | Freq: Once | SUBCUTANEOUS | Status: AC
  Administered 2016-05-27: 68 mg via SUBCUTANEOUS

## 2016-05-27 NOTE — Progress Notes (Signed)
   CLINIC ENCOUNTER NOTE  History:  26 y.o. [phone] here today for contraception education and management.  Patient is 7 weeks postpartum, has not been sexually active. Patient was initially thinking of doing the IUD for birth control, but is now wanting to do something else. Reviewed all forms of birth control that are safe to do while breastfeeding. Discussed risks/benefits and side effects. She desires LARC. She had regular menses prior to pregnancy. Has no vaginal or bleeding complaints. Patient ultimately decided on Nexplanon.  Past Medical History:  Diagnosis Date  . Asthma     Past Surgical History:  Procedure Laterality Date  . NO PAST SURGERIES      The following portions of the patient's history were reviewed and updated as appropriate: allergies, current medications, past family history, past medical history, past social history, past surgical history and problem list.     Review of Systems:  See above; comprehensive review of systems was otherwise negative.   Objective:  Physical Exam BP 130/85   Pulse 66   Ht 5\' 11"  (1.803 m)   Wt 247 lb (112 kg)   LMP 05/17/2016 (Approximate)   Breastfeeding? Yes   BMI 34.45 kg/m  CONSTITUTIONAL: Well-developed, well-nourished female in no acute distress.  HENT:  Normocephalic, atraumatic SKIN: Skin is warm and dry.  NEUROLGIC: Alert  PSYCHIATRIC: Normal mood and affect.  CARDIOVASCULAR: Normal heart rate noted RESPIRATORY: Effort and breath sounds normal, no problems with respiration noted ABDOMEN: Soft, no distention noted.  No tenderness, rebound or guarding.     Labs and Imaging Pregnancy test today is NEGATIVE  Assessment & Plan:   1. Encounter for general counseling and advice on contraceptive management - Reviewed all forms of contraception, decided on Nexplanon  2. Encounter for initial prescription of implantable subdermal contraceptive - Inserted nexplanon today, see note  Routine preventative health  maintenance measures emphasized.     Jen MowElizabeth Edy Belt, DO OB/GYN Fellow [facility]

## 2016-05-27 NOTE — Patient Instructions (Signed)
Etonogestrel implant What is this medicine? ETONOGESTREL (et oh noe JES trel) is a contraceptive (birth control) device. It is used to prevent pregnancy. It can be used for up to 3 years. This medicine may be used for other purposes; ask your health care provider or pharmacist if you have questions. COMMON BRAND NAME(S): Implanon, Nexplanon What should I tell my health care provider before I take this medicine? They need to know if you have any of these conditions: -abnormal vaginal bleeding -blood vessel disease or blood clots -cancer of the breast, cervix, or liver -depression -diabetes -gallbladder disease -headaches -heart disease or recent heart attack -high blood pressure -high cholesterol -kidney disease -liver disease -renal disease -seizures -tobacco smoker -an unusual or allergic reaction to etonogestrel, other hormones, anesthetics or antiseptics, medicines, foods, dyes, or preservatives -pregnant or trying to get pregnant -breast-feeding How should I use this medicine? This device is inserted just under the skin on the inner side of your upper arm by a health care professional. Talk to your pediatrician regarding the use of this medicine in children. Special care may be needed. Overdosage: If you think you have taken too much of this medicine contact a poison control center or emergency room at once. NOTE: This medicine is only for you. Do not share this medicine with others. What if I miss a dose? This does not apply. What may interact with this medicine? Do not take this medicine with any of the following medications: -amprenavir -bosentan -fosamprenavir This medicine may also interact with the following medications: -barbiturate medicines for inducing sleep or treating seizures -certain medicines for fungal infections like ketoconazole and itraconazole -grapefruit juice -griseofulvin -medicines to treat seizures like carbamazepine, felbamate, oxcarbazepine,  phenytoin, topiramate -modafinil -phenylbutazone -rifampin -rufinamide -some medicines to treat HIV infection like atazanavir, indinavir, lopinavir, nelfinavir, tipranavir, ritonavir -St. John's wort This list may not describe all possible interactions. Give your health care provider a list of all the medicines, herbs, non-prescription drugs, or dietary supplements you use. Also tell them if you smoke, drink alcohol, or use illegal drugs. Some items may interact with your medicine. What should I watch for while using this medicine? This product does not protect you against HIV infection (AIDS) or other sexually transmitted diseases. You should be able to feel the implant by pressing your fingertips over the skin where it was inserted. Contact your doctor if you cannot feel the implant, and use a non-hormonal birth control method (such as condoms) until your doctor confirms that the implant is in place. If you feel that the implant may have broken or become bent while in your arm, contact your healthcare provider. What side effects may I notice from receiving this medicine? Side effects that you should report to your doctor or health care professional as soon as possible: -allergic reactions like skin rash, itching or hives, swelling of the face, lips, or tongue -breast lumps -changes in emotions or moods -depressed mood -heavy or prolonged menstrual bleeding -pain, irritation, swelling, or bruising at the insertion site -scar at site of insertion -signs of infection at the insertion site such as fever, and skin redness, pain or discharge -signs of pregnancy -signs and symptoms of a blood clot such as breathing problems; changes in vision; chest pain; severe, sudden headache; pain, swelling, warmth in the leg; trouble speaking; sudden numbness or weakness of the face, arm or leg -signs and symptoms of liver injury like dark yellow or brown urine; general ill feeling or flu-like symptoms;  light-colored   stools; loss of appetite; nausea; right upper belly pain; unusually weak or tired; yellowing of the eyes or skin -unusual vaginal bleeding, discharge -signs and symptoms of a stroke like changes in vision; confusion; trouble speaking or understanding; severe headaches; sudden numbness or weakness of the face, arm or leg; trouble walking; dizziness; loss of balance or coordination Side effects that usually do not require medical attention (report to your doctor or health care professional if they continue or are bothersome): -acne -back pain -breast pain -changes in weight -dizziness -general ill feeling or flu-like symptoms -headache -irregular menstrual bleeding -nausea -sore throat -vaginal irritation or inflammation This list may not describe all possible side effects. Call your doctor for medical advice about side effects. You may report side effects to FDA at 1-800-FDA-1088. Where should I keep my medicine? This drug is given in a hospital or clinic and will not be stored at home. NOTE: This sheet is a summary. It may not cover all possible information. If you have questions about this medicine, talk to your doctor, pharmacist, or health care provider.  2018 Elsevier/Gold Standard (2015-07-27 11:19:22)  

## 2016-05-27 NOTE — Progress Notes (Signed)
Patient would like to get on birth control pills. Patient states she has not had unprotected sex within the last 2 weeks. Patient also complains of numbness in her left leg and it happens at least once a day since delivery of her baby.

## 2016-05-27 NOTE — Progress Notes (Signed)
     GYNECOLOGY CLINIC PROCEDURE NOTE  Lisa Brown is a 26 y.o. [id number] here for Nexplanon insertion.  Last pap smear was on 09/20/2015 and was normal.  No other gynecologic concerns.  Nexplanon Insertion Procedure Patient identified, informed consent performed, consent signed.   Patient does understand that irregular bleeding is a very common side effect of this medication. She was advised to have backup contraception for one week after placement. Pregnancy test in clinic today was negative.  Appropriate time out taken.  Patient's left arm was prepped and draped in the usual sterile fashion.. The ruler used to measure and mark insertion area.  Patient was prepped with alcohol swab and then injected with 3 ml of 1% lidocaine.  She was prepped with betadine, Nexplanon removed from packaging,  Device confirmed in needle, then inserted full length of needle and withdrawn per handbook instructions. Nexplanon was able to palpated in the patient's arm; patient palpated the insert herself. There was minimal blood loss.  Patient insertion site covered with guaze and a pressure bandage to reduce any bruising.  The patient tolerated the procedure well and was given post procedure instructions.    Expiration date: 05/2018  Lot #: [id number]  Cleda ClarksELIZABETH W MUMAW, DO OB FELLOW [facility]

## 2016-05-27 NOTE — Addendum Note (Signed)
Addended by: Clearnce SorrelPICKARD, Legend Tumminello S on: 05/27/2016 05:07 PM   Modules accepted: Orders

## 2016-06-25 ENCOUNTER — Ambulatory Visit: Payer: Medicaid Other | Admitting: Advanced Practice Midwife

## 2017-05-15 ENCOUNTER — Encounter: Payer: Self-pay | Admitting: *Deleted

## 2017-08-29 ENCOUNTER — Telehealth: Payer: Self-pay | Admitting: General Practice

## 2017-08-29 NOTE — Telephone Encounter (Signed)
Patient called and left message on nurse voicemail line stating she has had the nexplanon for a while now and hasn't had any periods then started to have periods every other month. Patient reports her periods lately have been lasting a long time with the current one going for 18 days. Patient states she knows she can have irregular periods with this but wants to make sure this is normal. Called patient, no answer- left message on her voicemail stating we have received your voicemail message. Yes, those are normal side effects we expect. Stated she should call us back if she starts having other problems with that so she can be seen. Stated you may call us back if you have other questions.

## 2017-11-04 ENCOUNTER — Ambulatory Visit: Payer: Self-pay | Admitting: Advanced Practice Midwife

## 2017-11-04 NOTE — Progress Notes (Deleted)
Subjective:     Lisa Brown is a 27 y.o. female here for a routine exam.  Current complaints: ***.  Personal health questionnaire reviewed: {yes/no:9010}.   Gynecologic History No LMP recorded. Contraception: {method:5051} Last Pap: ***. Results were: {norm/abn:16337} Last mammogram: ***. Results were: {norm/abn:16337}  Obstetric History OB History  Gravida Para Term Preterm AB Living  4 1 1  0 3 1  SAB TAB Ectopic Multiple Live Births  1 2 0 0 1    # Outcome Date GA Lbr Len/2nd Weight Sex Delivery Anes PTL Lv  4 Term 04/06/16 [redacted]w[redacted]d 18:53 / 00:44 3635 g F Vag-Spont None  LIV  3 TAB           2 TAB           1 SAB              {Common ambulatory SmartLinks:19316}  Review of Systems {ros; complete:30496}    Objective:    {exam; complete:18323}    Assessment:    Healthy female exam.    Plan:    {plan:19193}

## 2017-12-11 ENCOUNTER — Other Ambulatory Visit: Payer: Self-pay

## 2017-12-11 ENCOUNTER — Encounter (HOSPITAL_COMMUNITY): Payer: Self-pay

## 2017-12-11 ENCOUNTER — Ambulatory Visit (HOSPITAL_COMMUNITY)
Admission: EM | Admit: 2017-12-11 | Discharge: 2017-12-11 | Disposition: A | Discharge status: Home / Self Care | Payer: Medicaid Other | Attending: Emergency Medicine | Admitting: Emergency Medicine

## 2017-12-11 DIAGNOSIS — S80911A Unspecified superficial injury of right knee, initial encounter: Secondary | ICD-10-CM

## 2017-12-11 DIAGNOSIS — M25561 Pain in right knee: Secondary | ICD-10-CM

## 2017-12-11 DIAGNOSIS — W2209XA Striking against other stationary object, initial encounter: Secondary | ICD-10-CM

## 2017-12-11 MED ORDER — MELOXICAM 7.5 MG PO TABS: 7.5000 mg | ORAL_TABLET | Freq: Every day | ORAL | 0 refills | Status: DC

## 2017-12-11 NOTE — ED Triage Notes (Signed)
Pt cc she hit her right knee on the corner of a wood table.  This happened last night.

## 2017-12-11 NOTE — Discharge Instructions (Signed)
Use of brace with activity.  Ice and elevation, especially after increased use.  Meloxicam daily, take with food.  See exercises provided.  If develop worsening or persistent pain please follow up with sports medicine.

## 2017-12-11 NOTE — ED Provider Notes (Signed)
[facility]    CSN: [id number] Arrival date & time: 12/11/17  1306     History   Chief Complaint Chief Complaint  Patient presents with  . Knee Pain    HPI Novella Olivember V Muise is a 27 y.o. female.   [place] presents with complaints of right knee pain. Has been ongoing for the past month after she accidentally hit it. She feels like it would hyperextend. Last night accidentally hit it again on the edge of a bench. Pain 6/10. Worse with extension to anterior knee. Had some swelling. No other injury. No numbness tingling or weakness. Pain 6/10. Took tylenol last night which didn't help much. Hx of asthma.     ROS per HPI.      Past Medical History:  Diagnosis Date  . Asthma     Patient Active Problem List   Diagnosis Date Noted  . Indication for care in labor or delivery 04/06/2016  . BMI 36.0-36.9,adult 10/06/2015  . Obesity in pregnancy 10/06/2015  . Marijuana use 10/06/2015  . Supervision of normal pregnancy, antepartum 09/20/2015  . Nausea/vomiting in pregnancy 08/16/2015    Past Surgical History:  Procedure Laterality Date  . NO PAST SURGERIES      OB History    Gravida  4   Para  1   Term  1   Preterm  0   AB  3   Living  1     SAB  1   TAB  2   Ectopic  0   Multiple  0   Live Births  1            Home Medications    Prior to Admission medications   Medication Sig Start Date End Date Taking? Authorizing Provider  ibuprofen (ADVIL,MOTRIN) 600 MG tablet Take 1 tablet (600 mg total) by mouth every 6 (six) hours. 04/08/16   Allie Bossierove, Myra C, MD  meloxicam (MOBIC) 7.5 MG tablet Take 1 tablet (7.5 mg total) by mouth daily. 12/11/17   Georgetta HaberBurky, Jacquelyn Shadrick B, NP  Prenatal Multivit-Min-Fe-FA (PRENATAL VITAMINS) 0.8 MG tablet Take 1 tablet by mouth daily. 04/08/16   Allie Bossierove, Myra C, MD    Family History Family History  Problem Relation Age of Onset  . Diabetes Mother   . Diabetes Father   . Kidney disease Father   . Hypertension Father   .  Liver disease Father   . Diabetes Maternal Grandmother   . Heart disease Maternal Grandmother   . Diabetes Maternal Grandfather     Social History Social History   Tobacco Use  . Smoking status: Never Smoker  . Smokeless tobacco: Never Used  Substance Use Topics  . Alcohol use: No  . Drug use: No     Allergies   Patient has no known allergies.   Review of Systems Review of Systems   Physical Exam Triage Vital Signs ED Triage Vitals  Enc Vitals Group     BP 12/11/17 1411 121/72     Pulse Rate 12/11/17 1411 68     Resp 12/11/17 1411 18     Temp 12/11/17 1411 97.8 F (36.6 C)     Temp Source 12/11/17 1411 Tympanic     SpO2 12/11/17 1411 100 %     Weight 12/11/17 1409 222 lb (100.7 kg)     Height --      Head Circumference --      Peak Flow --      Pain Score 12/11/17 1408 7  Pain Loc --      Pain Edu? --      Excl. in GC? --    No data found.  Updated Vital Signs BP 121/72 (BP Location: Left Arm)   Pulse 68   Temp 97.8 F (36.6 C) (Tympanic)   Resp 18   Wt 222 lb (100.7 kg)   SpO2 100%   BMI 30.96 kg/m    Physical Exam  Constitutional: She is oriented to person, place, and time. She appears well-developed and well-nourished. No distress.  Cardiovascular: Normal rate, regular rhythm and normal heart sounds.  Pulmonary/Chest: Effort normal and breath sounds normal.  Musculoskeletal:       Right knee: She exhibits normal range of motion, no swelling, no effusion, no ecchymosis, no deformity, no laceration, no erythema, normal alignment, no LCL laxity, normal patellar mobility, no bony tenderness, normal meniscus and no MCL laxity. Tenderness found. MCL tenderness noted. No LCL tenderness noted.  Mild superior knee pain on palpation as well as medial knee with mild tenderness; no pain with flexion or extension; no crepitus; no bruising or swelling; no laxity noted   Neurological: She is alert and oriented to person, place, and time.  Skin: Skin is warm  and dry.     UC Treatments / Results  Labs (all labs ordered are listed, but only abnormal results are displayed) Labs Reviewed - No data to display  EKG None  Radiology No results found.  Procedures Procedures (including critical care time)  Medications Ordered in UC Medications - No data to display  Initial Impression / Assessment and Plan / UC Course  I have reviewed the triage vital signs and the nursing notes.  Pertinent labs & imaging results that were available during my care of the patient were reviewed by me and considered in my medical decision making (see chart for details).     Recent contusion but patient primarily with sensation of hyperextension for a month. Exam without significant findings. Imaging deferred. Brace, ice, elevation, nsaids and exercises provided. Follow up with sports medicine as needed. Patient verbalized understanding and agreeable to plan.  Ambulatory out of clinic without difficulty.   Final Clinical Impressions(s) / UC Diagnoses   Final diagnoses:  Acute pain of right knee     Discharge Instructions     Use of brace with activity.  Ice and elevation, especially after increased use.  Meloxicam daily, take with food.  See exercises provided.  If develop worsening or persistent pain please follow up with sports medicine.    ED Prescriptions    Medication Sig Dispense Auth. Provider   meloxicam (MOBIC) 7.5 MG tablet Take 1 tablet (7.5 mg total) by mouth daily. 30 tablet Georgetta Haber, NP     Controlled Substance Prescriptions [place] Controlled Substance Registry consulted? Not Applicable   Georgetta Haber, NP 12/11/17 1446

## 2017-12-12 ENCOUNTER — Ambulatory Visit: Payer: Self-pay | Admitting: Student

## 2020-08-24 ENCOUNTER — Other Ambulatory Visit: Payer: Self-pay

## 2020-08-24 ENCOUNTER — Encounter: Payer: Self-pay | Admitting: Student

## 2020-08-24 ENCOUNTER — Ambulatory Visit (INDEPENDENT_AMBULATORY_CARE_PROVIDER_SITE_OTHER): Payer: Medicaid Other | Admitting: Student

## 2020-08-24 VITALS — BP 117/82 | HR 82 | Ht 71.0 in | Wt 276.0 lb

## 2020-08-24 DIAGNOSIS — Z3046 Encounter for surveillance of implantable subdermal contraceptive: Secondary | ICD-10-CM

## 2020-08-24 DIAGNOSIS — Z30011 Encounter for initial prescription of contraceptive pills: Secondary | ICD-10-CM

## 2020-08-24 MED ORDER — LEVONORGEST-ETH ESTRAD 91-DAY 0.15-0.03 MG PO TABS: 1.0000 | ORAL_TABLET | Freq: Every day | ORAL | 4 refills | Status: DC

## 2020-08-24 NOTE — Progress Notes (Signed)
     GYNECOLOGY OFFICE PROCEDURE NOTE  Lisa Brown is a 30 y.o. [phone] here for Nexplanon removal. Last pap smear was unknown and was normal.  No other gynecologic concerns. She needs a pap smear; she wants to switch to OCP.    Nexplanon Removal Patient identified, informed consent performed, consent signed.   Appropriate time out taken. Nexplanon site identified.  Area prepped in usual sterile fashon. One ml of 1% lidocaine was used to anesthetize the area at the distal end of the implant. A small stab incision was made right beside the implant on the distal portion.  The Nexplanon rod was grasped using hemostats and removed without difficulty.  There was minimal blood loss. There were no complications.  3 ml of 1% lidocaine was injected around the incision for post-procedure analgesia.  Steri-strips were applied over the small incision.  A pressure bandage was applied to reduce any bruising.  The patient tolerated the procedure well and was given post procedure instructions.  Patient is planning to use OCP for contraception/attempt conception.  -reviewed options for OCP; she wants to try [place]. RX sent and patient will start taking.  -Patient to make appt for pap smear in 4 weeks; unable to do today.  -All questions answered.

## 2020-10-19 ENCOUNTER — Ambulatory Visit: Payer: Medicaid Other | Admitting: Student

## 2020-11-27 ENCOUNTER — Ambulatory Visit (INDEPENDENT_AMBULATORY_CARE_PROVIDER_SITE_OTHER): Payer: Self-pay | Admitting: *Deleted

## 2020-11-27 DIAGNOSIS — Z3200 Encounter for pregnancy test, result unknown: Secondary | ICD-10-CM

## 2020-11-27 DIAGNOSIS — Z3201 Encounter for pregnancy test, result positive: Secondary | ICD-10-CM

## 2020-11-27 LAB — POCT PREGNANCY, URINE: Preg Test, Ur: POSITIVE — AB

## 2020-11-27 NOTE — Progress Notes (Signed)
Patient left urine for pregnancy test which was positive. I called her to notify her. I reviewed medications with her. She reports sure LMP of 10/10/20.  This makes her [redacted]w[redacted]d with LMP 07/17/20.  I advised to start prenatal care. She reports she is already taking prenatal vitamins. I informed her I will place a list of prenatal providers in her chart.  She also asked for pregnancy confirmation in her Mychart which I confirmed I Can do. Lisa Oceguera,RN

## 2020-11-27 NOTE — Progress Notes (Signed)
Chart reviewed, agree with plan.

## 2020-12-04 ENCOUNTER — Ambulatory Visit: Payer: Medicaid Other | Admitting: Student

## 2020-12-04 ENCOUNTER — Inpatient Hospital Stay (HOSPITAL_COMMUNITY)
Admission: AD | Admit: 2020-12-04 | Discharge: 2020-12-04 | Disposition: A | Discharge status: Home / Self Care | Payer: Medicaid Other | Attending: Family Medicine | Admitting: Family Medicine

## 2020-12-04 ENCOUNTER — Inpatient Hospital Stay (HOSPITAL_COMMUNITY): Payer: Medicaid Other

## 2020-12-04 ENCOUNTER — Telehealth: Payer: Self-pay | Admitting: Obstetrics and Gynecology

## 2020-12-04 ENCOUNTER — Encounter (HOSPITAL_COMMUNITY): Payer: Self-pay | Admitting: Family Medicine

## 2020-12-04 DIAGNOSIS — Z3A01 Less than 8 weeks gestation of pregnancy: Secondary | ICD-10-CM | POA: Diagnosis not present

## 2020-12-04 DIAGNOSIS — O26899 Other specified pregnancy related conditions, unspecified trimester: Secondary | ICD-10-CM

## 2020-12-04 DIAGNOSIS — R109 Unspecified abdominal pain: Secondary | ICD-10-CM | POA: Insufficient documentation

## 2020-12-04 DIAGNOSIS — O3680X0 Pregnancy with inconclusive fetal viability, not applicable or unspecified: Secondary | ICD-10-CM | POA: Diagnosis not present

## 2020-12-04 DIAGNOSIS — O26891 Other specified pregnancy related conditions, first trimester: Secondary | ICD-10-CM | POA: Diagnosis not present

## 2020-12-04 DIAGNOSIS — R102 Pelvic and perineal pain: Secondary | ICD-10-CM | POA: Diagnosis not present

## 2020-12-04 LAB — CBC
HCT: 40.5 % (ref 36.0–46.0)
Hemoglobin: 13.6 g/dL (ref 12.0–15.0)
MCH: 28.4 pg (ref 26.0–34.0)
MCHC: 33.6 g/dL (ref 30.0–36.0)
MCV: 84.6 fL (ref 80.0–100.0)
Platelets: 321 10*3/uL (ref 150–400)
RBC: 4.79 MIL/uL (ref 3.87–5.11)
RDW: 13.3 % (ref 11.5–15.5)
WBC: 7.3 10*3/uL (ref 4.0–10.5)
nRBC: 0 % (ref 0.0–0.2)

## 2020-12-04 LAB — COMPREHENSIVE METABOLIC PANEL
ALT: 22 U/L (ref 0–44)
AST: 19 U/L (ref 15–41)
Albumin: 4 g/dL (ref 3.5–5.0)
Alkaline Phosphatase: 47 U/L (ref 38–126)
Anion gap: 9 (ref 5–15)
BUN: <5 mg/dL — ABNORMAL LOW (ref 6–20)
CO2: 25 mmol/L (ref 22–32)
Calcium: 9.6 mg/dL (ref 8.9–10.3)
Chloride: 101 mmol/L (ref 98–111)
Creatinine, Ser: 0.79 mg/dL (ref 0.44–1.00)
GFR, Estimated: >60 mL/min (ref >60)
Glucose, Bld: 133 mg/dL — ABNORMAL HIGH (ref 70–99)
Potassium: 3.5 mmol/L (ref 3.5–5.1)
Sodium: 135 mmol/L (ref 135–145)
Total Bilirubin: 0.4 mg/dL (ref 0.3–1.2)
Total Protein: 7.4 g/dL (ref 6.5–8.1)

## 2020-12-04 LAB — WET PREP, GENITAL
Clue Cells Wet Prep HPF POC: NONE SEEN
Sperm: NONE SEEN
Trich, Wet Prep: NONE SEEN
Yeast Wet Prep HPF POC: NONE SEEN

## 2020-12-04 LAB — HCG, QUANTITATIVE, PREGNANCY: hCG, Beta Chain, Quant, S: 3539 m[IU]/mL — ABNORMAL HIGH (ref <5)

## 2020-12-04 LAB — URINALYSIS, ROUTINE W REFLEX MICROSCOPIC
Bilirubin Urine: NEGATIVE
Glucose, UA: NEGATIVE mg/dL
Hgb urine dipstick: NEGATIVE
Ketones, ur: NEGATIVE mg/dL
Leukocytes,Ua: NEGATIVE
Nitrite: NEGATIVE
Protein, ur: NEGATIVE mg/dL
Specific Gravity, Urine: 1.015 (ref 1.005–1.030)
pH: 7 (ref 5.0–8.0)

## 2020-12-04 NOTE — MAU Provider Note (Signed)
History     CSN: [id number]  Arrival date and time: 12/04/20 1121   Event Date/Time   First Provider Initiated Contact with Patient 12/04/20 1259      Chief Complaint  Patient presents with   Abdominal Pain   HPI Lisa Brown is a 30 y.o. [id number] at [redacted]w[redacted]d by LMP who presents to MAU with chief complaint of abdominal cramping. This is a new problem, onset two days ago. Pain is suprapubic, does not radiate. Pain score about 7/10. She denies aggravating or alleviating factors. She has not taken medication or tried other treatments for this complaint. She denies vaginal bleeding, abdominal tenderness, dysuria, fever or recent illness. Patient receives care with [facility].  OB History     Gravida  5   Para  1   Term  1   Preterm  0   AB  3   Living  1      SAB  1   IAB  2   Ectopic  0   Multiple  0   Live Births  1           Past Medical History:  Diagnosis Date   Asthma     Past Surgical History:  Procedure Laterality Date   NO PAST SURGERIES      Family History  Problem Relation Age of Onset   Diabetes Mother    Diabetes Father    Kidney disease Father    Hypertension Father    Liver disease Father    Diabetes Maternal Grandmother    Heart disease Maternal Grandmother    Diabetes Maternal Grandfather     Social History   Tobacco Use   Smoking status: Never   Smokeless tobacco: Never  Substance Use Topics   Alcohol use: No   Drug use: No    Allergies: No Known Allergies  Medications Prior to Admission  Medication Sig Dispense Refill Last Dose   ibuprofen (ADVIL,MOTRIN) 600 MG tablet Take 1 tablet (600 mg total) by mouth every 6 (six) hours. 30 tablet 0    levonorgestrel-ethinyl estradiol (SEASONALE) 0.15-0.03 MG tablet Take 1 tablet by mouth daily. 91 tablet 4    meloxicam (MOBIC) 7.5 MG tablet Take 1 tablet (7.5 mg total) by mouth daily. 30 tablet 0    Prenatal Multivit-Min-Fe-FA (PRENATAL VITAMINS) 0.8 MG tablet Take 1 tablet by  mouth daily. 30 tablet 12    Prenatal Vit-Fe Fumarate-FA (PRENATAL VITAMIN PO) Take 1 tablet by mouth daily.       Review of Systems  Gastrointestinal:  Positive for abdominal pain.  All other systems reviewed and are negative. Physical Exam   Blood pressure 116/72, pulse 87, temperature 98.2 F (36.8 C), temperature source Oral, resp. rate 20, height 5\' 11"  (1.803 m), weight 124 kg, last menstrual period 10/10/2020, SpO2 100 %, currently breastfeeding.  Physical Exam Vitals and nursing note reviewed. Exam conducted with a chaperone present.  Constitutional:      Appearance: She is well-developed. She is not ill-appearing.  Cardiovascular:     Rate and Rhythm: Normal rate.     Heart sounds: Normal heart sounds.  Pulmonary:     Effort: Pulmonary effort is normal.  Abdominal:     Tenderness: There is no abdominal tenderness. There is no right CVA tenderness or left CVA tenderness.  Skin:    Capillary Refill: Capillary refill takes less than 2 seconds.  Neurological:     Mental Status: She is alert.    MAU Course/MDM  Procedures  Orders Placed This Encounter  Procedures   Wet prep, genital   US OB LESS THAN 14 WEEKS WITH OB TRANSVAGINAL   CBC   Comprehensive metabolic panel   hCG, quantitative, pregnancy   Urinalysis, Routine w reflex microscopic Urine, Clean Catch   Nursing communication   Discharge patient   Patient Vitals for the past 24 hrs:  BP Temp Temp src Pulse Resp SpO2 Height Weight  12/04/20 1133 116/72 98.2 F (36.8 C) Oral 87 20 100 % 5\' 11"  (1.803 m) 124 kg   Results for orders placed or performed during the hospital encounter of 12/04/20 (from the past 24 hour(s))  CBC     Status: None   Collection Time: 12/04/20 11:54 AM  Result Value Ref Range   WBC 7.3 4.0 - 10.5 K/uL   RBC 4.79 3.87 - 5.11 MIL/uL   Hemoglobin 13.6 12.0 - 15.0 g/dL   HCT 12/06/20 77.4 - 12.8 %   MCV 84.6 80.0 - 100.0 fL   MCH 28.4 26.0 - 34.0 pg   MCHC 33.6 30.0 - 36.0 g/dL   RDW  78.6 76.7 - 20.9 %   Platelets 321 150 - 400 K/uL   nRBC 0.0 0.0 - 0.2 %  Comprehensive metabolic panel     Status: Abnormal   Collection Time: 12/04/20 11:54 AM  Result Value Ref Range   Sodium 135 135 - 145 mmol/L   Potassium 3.5 3.5 - 5.1 mmol/L   Chloride 101 98 - 111 mmol/L   CO2 25 22 - 32 mmol/L   Glucose, Bld 133 (H) 70 - 99 mg/dL   BUN <5 (L) 6 - 20 mg/dL   Creatinine, Ser 12/06/20 0.44 - 1.00 mg/dL   Calcium 9.6 8.9 - 9.62 mg/dL   Total Protein 7.4 6.5 - 8.1 g/dL   Albumin 4.0 3.5 - 5.0 g/dL   AST 19 15 - 41 U/L   ALT 22 0 - 44 U/L   Alkaline Phosphatase 47 38 - 126 U/L   Total Bilirubin 0.4 0.3 - 1.2 mg/dL   GFR, Estimated 83.6 >62 mL/min   Anion gap 9 5 - 15  hCG, quantitative, pregnancy     Status: Abnormal   Collection Time: 12/04/20 11:54 AM  Result Value Ref Range   hCG, Beta Chain, Quant, S 3,539 (H) <5 mIU/mL  Urinalysis, Routine w reflex microscopic Urine, Clean Catch     Status: Abnormal   Collection Time: 12/04/20 12:00 PM  Result Value Ref Range   Color, Urine YELLOW YELLOW   APPearance HAZY (A) CLEAR   Specific Gravity, Urine 1.015 1.005 - 1.030   pH 7.0 5.0 - 8.0   Glucose, UA NEGATIVE NEGATIVE mg/dL   Hgb urine dipstick NEGATIVE NEGATIVE   Bilirubin Urine NEGATIVE NEGATIVE   Ketones, ur NEGATIVE NEGATIVE mg/dL   Protein, ur NEGATIVE NEGATIVE mg/dL   Nitrite NEGATIVE NEGATIVE   Leukocytes,Ua NEGATIVE NEGATIVE  Wet prep, genital     Status: Abnormal   Collection Time: 12/04/20 12:05 PM   Specimen: Urine, Clean Catch  Result Value Ref Range   Yeast Wet Prep HPF POC NONE SEEN NONE SEEN   Trich, Wet Prep NONE SEEN NONE SEEN   Clue Cells Wet Prep HPF POC NONE SEEN NONE SEEN   WBC, Wet Prep HPF POC MODERATE (A) NONE SEEN   Sperm NONE SEEN    12/06/20 OB LESS THAN 14 WEEKS WITH OB TRANSVAGINAL  Result Date: 12/04/2020 CLINICAL DATA:  Cramps for 2 days.  Quantitative beta HCG is pending. LMP was 10/10/2020. Patient is 7 weeks 6 days by LMP. EXAM: OBSTETRIC <14  WK [place] AND TRANSVAGINAL OB US TECHNIQUE: Both transabdominal and transvaginal ultrasound examinations were performed for complete evaluation of the gestation as well as the maternal uterus, adnexal regions, and pelvic cul-de-sac. Transvaginal technique was performed to assess early pregnancy. COMPARISON:  None. FINDINGS: Intrauterine gestational sac: Single Yolk sac:  Not Visualized. Embryo:  Not Visualized. Cardiac Activity: Not Visualized. MSD: 4.2 mm   5 w   1 d Subchorionic hemorrhage:  None visualized. Maternal uterus/adnexae: Normal appearance of both ovaries. RIGHT corpus luteum is present. Trace free pelvic fluid is likely physiologic. IMPRESSION: Probable early intrauterine gestational sac, but no yolk sac, fetal pole, or cardiac activity yet visualized. Recommend follow-up quantitative B-HCG levels and follow-up US in 14 or more days to assess viability. This recommendation follows SRU consensus guidelines: Diagnostic Criteria for Nonviable Pregnancy Early in the First Trimester. Malva Limes Med 2013; 286:3817-71. Electronically Signed   By: Norva Pavlov M.D.   On: 12/04/2020 12:43      Assessment and Plan  --30 y.o. [id number] with pregnancy of unknown location --+ GS --Quant 3539 --Discharge home in stable condition with ectopic precautions  F/U: --Appt made for repeat stat Quant hCG at [facility] Wednesday 11/16 at 1pm  Calvert Cantor, MSA, MSN, CNM 12/04/2020, 3:57 PM

## 2020-12-04 NOTE — MAU Note (Signed)
[place], started over the weekend, getting more and more intense. Denies bleeding.

## 2020-12-04 NOTE — Discharge Instructions (Signed)

## 2020-12-04 NOTE — Telephone Encounter (Signed)
Patient called complaining of persistent cramping x 2 days.  She is early pregnant.  She denies any spotting or bleeding.   Advised patient per protocol to increase water intake and may take Tylenol as directed.   Patient has a history of prior missed AB and is concerned due to cramping x 2 days.   Advised patient to go to MAU for evaluation.   Patient has her New OB visits scheduled with [facility].

## 2020-12-05 LAB — GC/CHLAMYDIA PROBE AMP (~~LOC~~) NOT AT ARMC
Chlamydia: NEGATIVE
Comment: NEGATIVE
Comment: NORMAL
Neisseria Gonorrhea: NEGATIVE

## 2020-12-06 ENCOUNTER — Ambulatory Visit (INDEPENDENT_AMBULATORY_CARE_PROVIDER_SITE_OTHER): Payer: Medicaid Other | Admitting: *Deleted

## 2020-12-06 ENCOUNTER — Other Ambulatory Visit: Payer: Self-pay

## 2020-12-06 DIAGNOSIS — O3680X0 Pregnancy with inconclusive fetal viability, not applicable or unspecified: Secondary | ICD-10-CM

## 2020-12-06 NOTE — Progress Notes (Addendum)
  SUBJECTIVE:  30 y.o. female in office for Stat Hcg. Pt states she is having mild cramping, no bleeding, no severe pain.    Patient's last menstrual period was 11/02/2020. LMP updated in chart, pt did not have correct date.  OBJECTIVE:  She appears well, afebrile.   ASSESSMENT:  Hcg for viability  PLAN:  Stat Hcg ordered. Pt will be called with results as soon as reviewed and plan made. Ectopic precautions given.    1650- phone call placed to pt after review of results with Dr Macon Large. Provider states change in Hcg not appropriate at this point. Advised will need appt with provider to discuss management or may repeat Quant on Friday and review for plan.  Pt agree to appt on Friday, with provider and possible labs.

## 2020-12-07 LAB — BETA HCG QUANT (REF LAB): hCG Quant: 3868 m[IU]/mL

## 2020-12-08 ENCOUNTER — Ambulatory Visit (INDEPENDENT_AMBULATORY_CARE_PROVIDER_SITE_OTHER): Payer: Medicaid Other | Admitting: Obstetrics and Gynecology

## 2020-12-08 ENCOUNTER — Encounter: Payer: Self-pay | Admitting: Obstetrics and Gynecology

## 2020-12-08 DIAGNOSIS — O200 Threatened abortion: Secondary | ICD-10-CM

## 2020-12-08 DIAGNOSIS — Z3490 Encounter for supervision of normal pregnancy, unspecified, unspecified trimester: Secondary | ICD-10-CM | POA: Insufficient documentation

## 2020-12-08 NOTE — Progress Notes (Signed)
Pt is in office to discuss recent Hcg results and plan.   ?? Repeat quant today.

## 2020-12-08 NOTE — Progress Notes (Signed)
Lisa Brown presents today to discuss BHCG results. LMP 11/02/20 G 5P1031 Seen in MAU on 11/14 for cramping, no bleeding. BHCG 3539 U/S gestation sac only at that time F/U BHCG on 12/06/20 was 3868 Pt reports no bleeding some mild cramping only Blood type O +  PE AF VSS Lungs clear Heart RRR Abd soft + BS  A/P Early pregnancy   Reviewed BHCG results and potential for MAB vs IUP. Will check OB U/S @ 12/18/20. MAB precautions reviewed with pt. F/U per U/S results.

## 2020-12-12 ENCOUNTER — Other Ambulatory Visit: Payer: Medicaid Other

## 2020-12-19 ENCOUNTER — Telehealth: Payer: Medicaid Other

## 2020-12-19 ENCOUNTER — Other Ambulatory Visit: Payer: Medicaid Other

## 2020-12-20 ENCOUNTER — Other Ambulatory Visit: Payer: Self-pay | Admitting: Obstetrics and Gynecology

## 2020-12-20 ENCOUNTER — Ambulatory Visit
Admission: RE | Admit: 2020-12-20 | Discharge: 2020-12-20 | Disposition: A | Discharge status: Home / Self Care | Payer: Medicaid Other | Source: Ambulatory Visit | Attending: Obstetrics and Gynecology | Admitting: Obstetrics and Gynecology

## 2020-12-20 ENCOUNTER — Other Ambulatory Visit: Payer: Self-pay

## 2020-12-20 ENCOUNTER — Telehealth: Payer: Self-pay | Admitting: Medical

## 2020-12-20 DIAGNOSIS — Z3490 Encounter for supervision of normal pregnancy, unspecified, unspecified trimester: Secondary | ICD-10-CM

## 2020-12-20 DIAGNOSIS — Z3491 Encounter for supervision of normal pregnancy, unspecified, first trimester: Secondary | ICD-10-CM | POA: Diagnosis not present

## 2020-12-20 IMAGING — US US OB TRANSVAGINAL
1 series · 15 of 28 positions shown · non-contrast
Comparison: Obstetric ultrasound [DATE]

CLINICAL DATA: Viability check

EXAM:
TRANSVAGINAL OB ULTRASOUND
TECHNIQUE: Transvaginal ultrasound was performed for complete evaluation of the
gestation as well as the maternal uterus, adnexal regions, and
pelvic cul-de-sac.

[Series 1: us ob transvaginal · 15 of 42 slices shown]
[im 1/42]
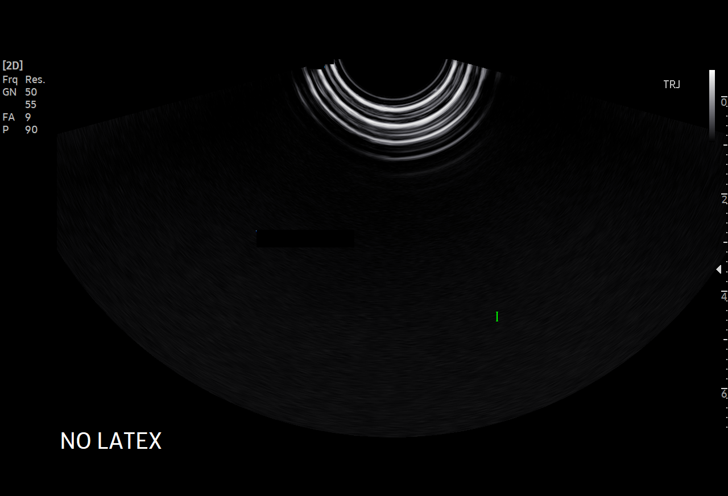
[im 4/42]
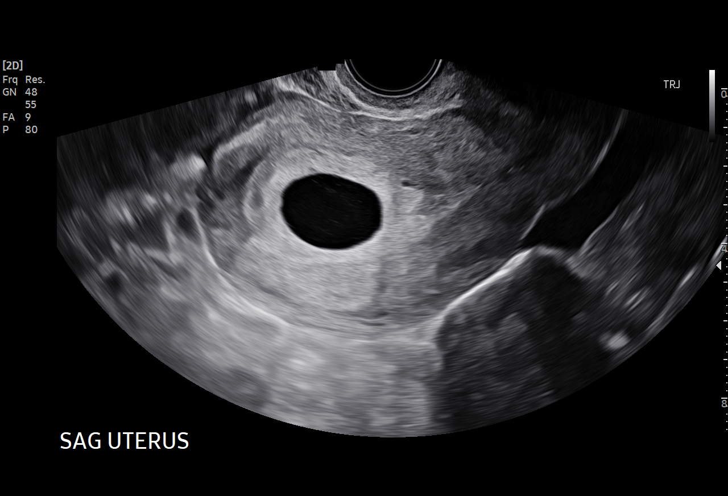
[im 7/42]
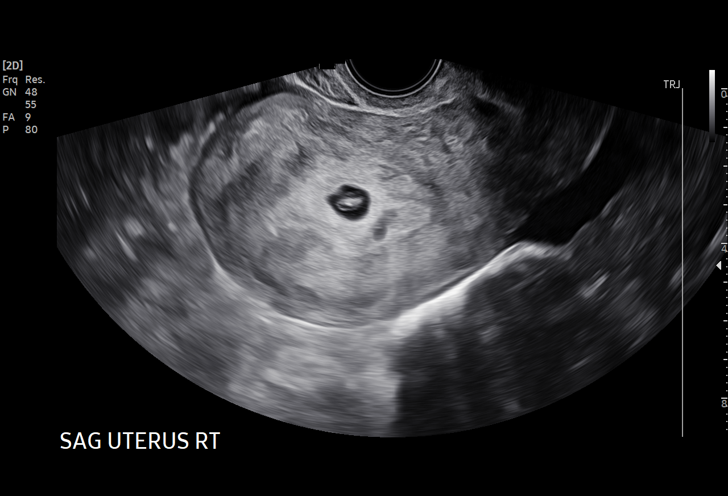
[im 10/42]
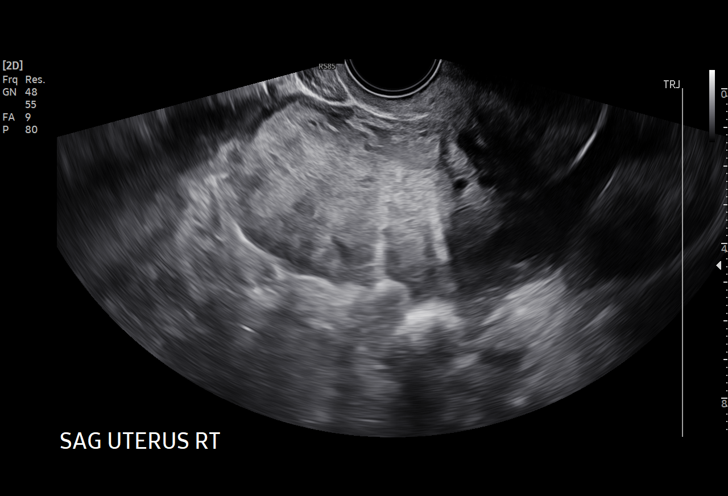
[im 13/42]
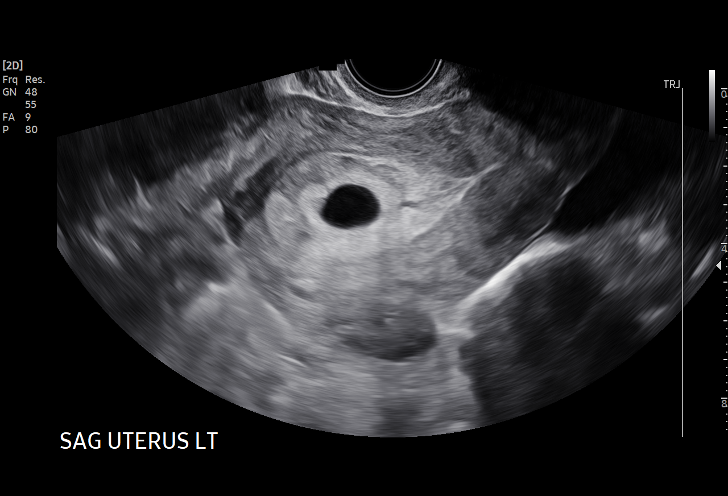
[im 16/42]
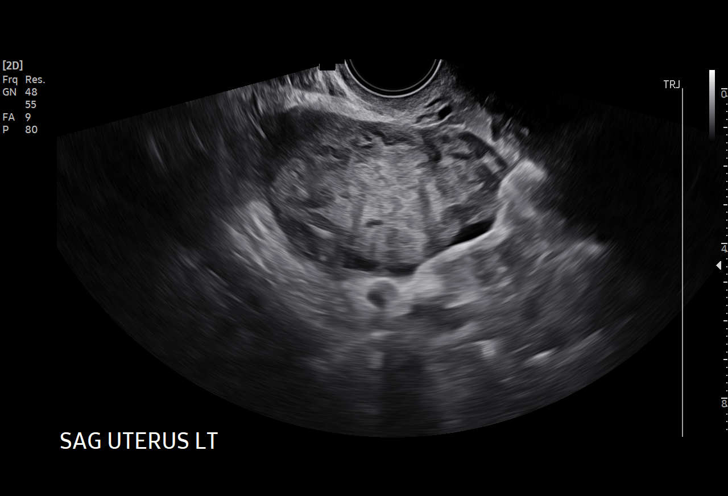
[im 19/42]
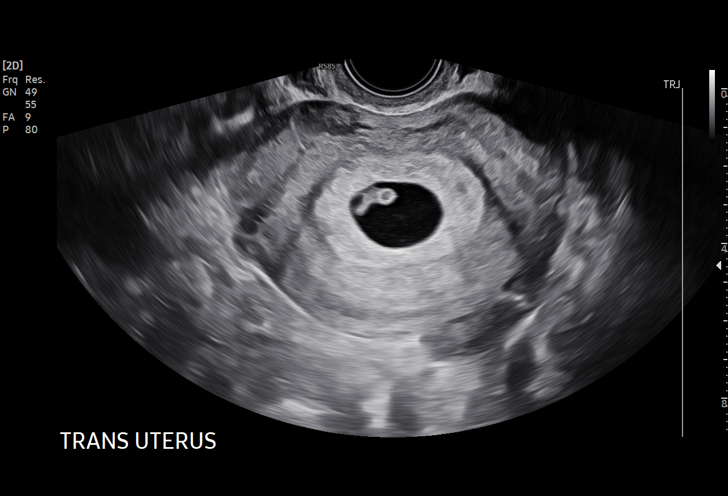
[im 22/42]
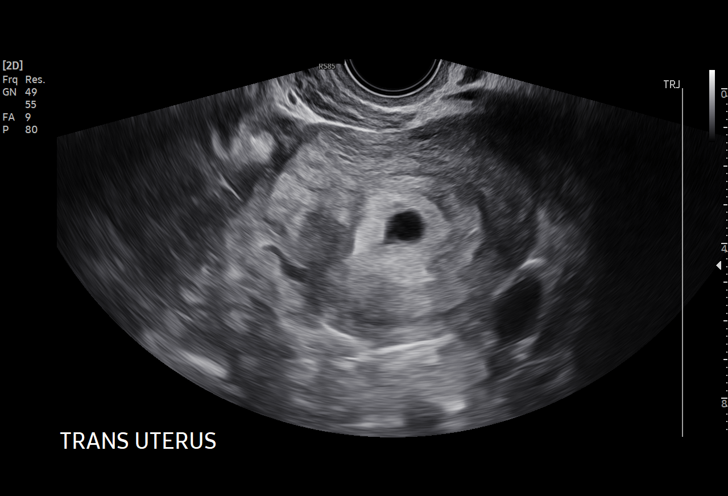
[im 23/42]
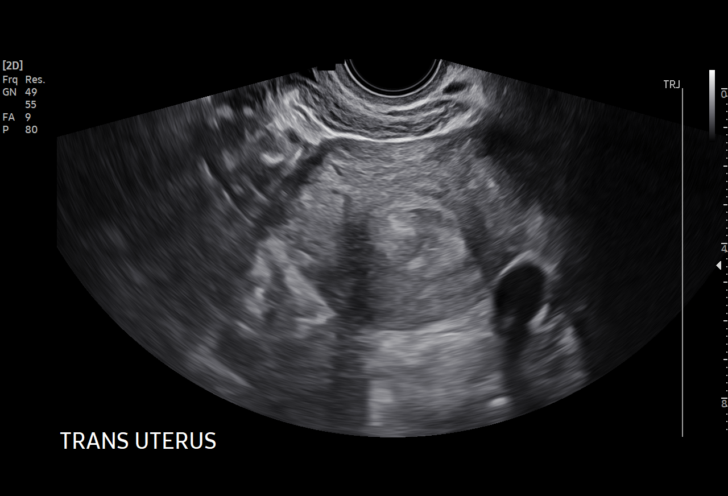
[im 26/42]
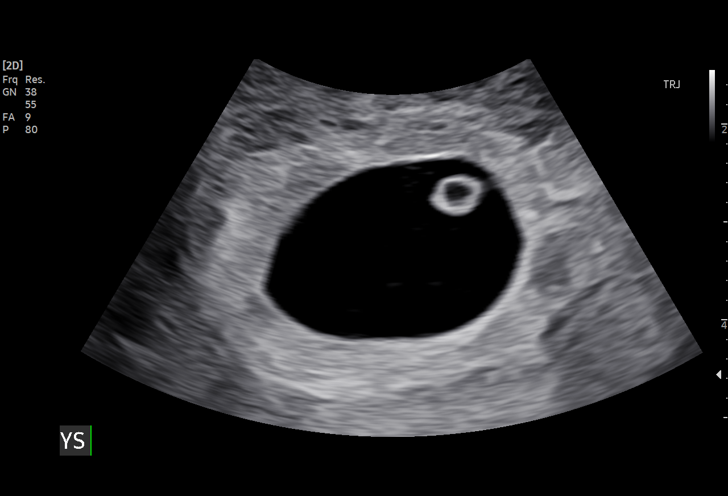
[im 29/42]
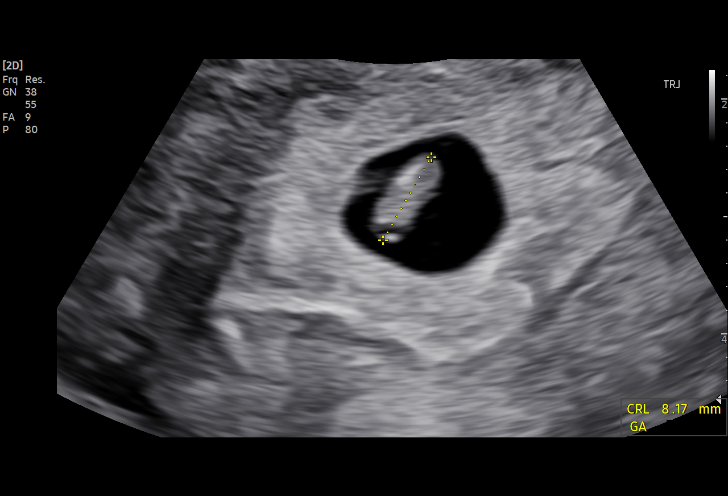
[im 32/42]
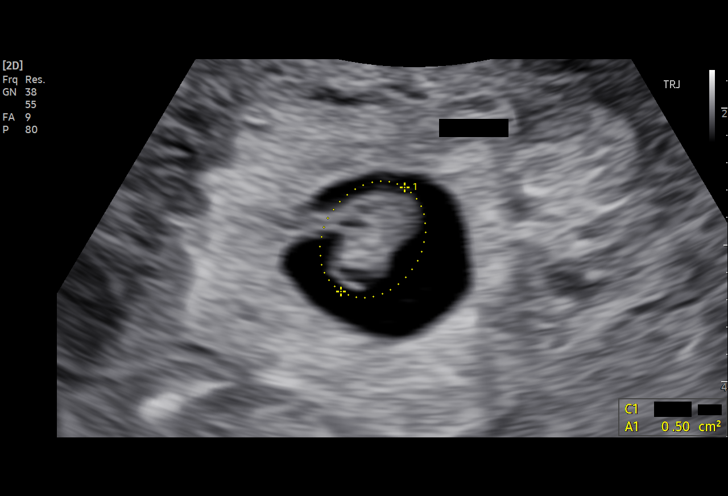
[im 35/42]
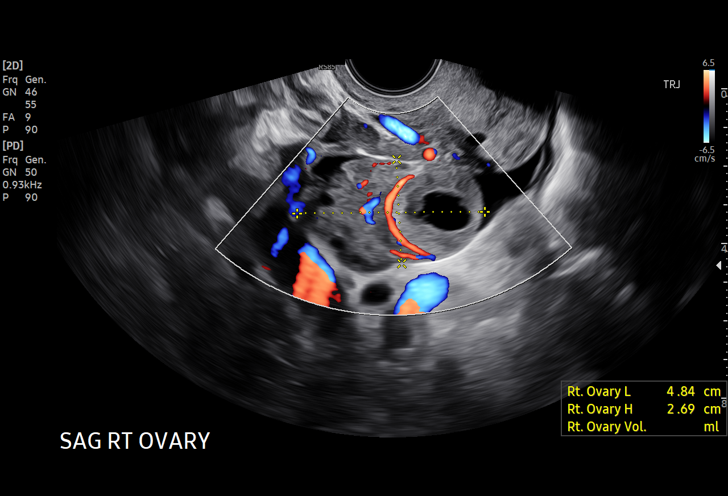
[im 38/42]
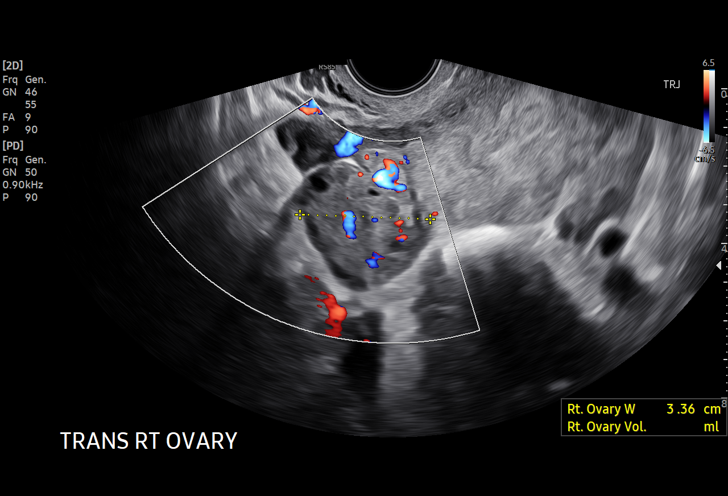
[im 42/42]
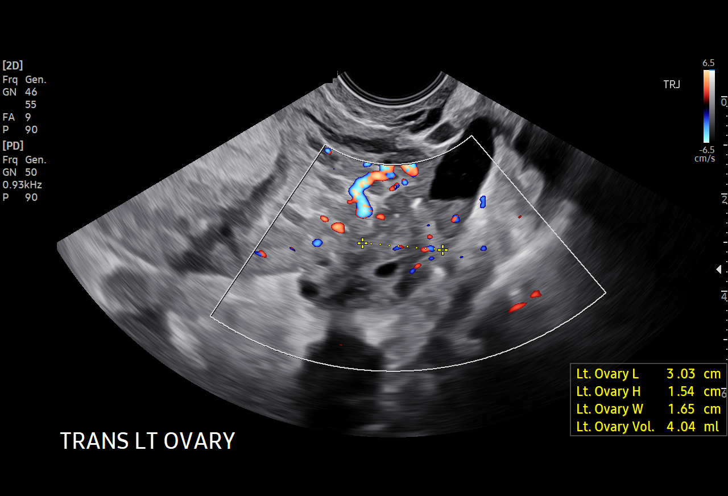

[15 of 28 positions shown; findings below may reference images not displayed]

FINDINGS: Intrauterine gestational sac: Single

Yolk sac:  Visualized.

Embryo:  Visualized.

Cardiac Activity: Visualized.

Heart Rate: 133 bpm

CRL: 8.3 mm   6 w 5 d                  US EDC: [DATE]

Subchorionic hemorrhage:  None visualized.

Maternal uterus/adnexae: A corpus luteum is noted on the right. The
left ovary is normal in appearance.

Other: There is small volume free fluid in the pelvis, nonspecific.
IMPRESSION: Single live intrauterine pregnancy identified with an estimated
gestational age of 6 weeks 5 days by crown-rump length.

## 2020-12-20 NOTE — Telephone Encounter (Signed)
I called Lisa Brown today at 1:57 PM and confirmed patient's identity using two patient identifiers. [place] results from earlier today were reviewed. Patient is scheduled for new OB visit at [facility] on 12/28/20. First trimester warning signs reviewed. Patient voiced understanding and had no further questions.   US OB Transvaginal  Result Date: 12/20/2020 CLINICAL DATA:  Viability check EXAM: TRANSVAGINAL OB ULTRASOUND TECHNIQUE: Transvaginal ultrasound was performed for complete evaluation of the gestation as well as the maternal uterus, adnexal regions, and pelvic cul-de-sac. COMPARISON:  Obstetric ultrasound 12/04/2020 FINDINGS: Intrauterine gestational sac: Single Yolk sac:  Visualized. Embryo:  Visualized. Cardiac Activity: Visualized. Heart Rate: 133 bpm CRL: 8.3 mm   6 w 5 d                  [place] EDC: 08/10/2021 Subchorionic hemorrhage:  None visualized. Maternal uterus/adnexae: A corpus luteum is noted on the right. The left ovary is normal in appearance. Other: There is small volume free fluid in the pelvis, nonspecific. IMPRESSION: Single live intrauterine pregnancy identified with an estimated gestational age of [redacted] weeks 5 days by crown-rump length. Electronically Signed   By: Lesia Hausen M.D.   On: 12/20/2020 08:41    Lisa Lowenstein, PA-C 12/20/2020 1:57 PM

## 2020-12-28 ENCOUNTER — Ambulatory Visit (INDEPENDENT_AMBULATORY_CARE_PROVIDER_SITE_OTHER): Payer: Medicaid Other | Admitting: *Deleted

## 2020-12-28 DIAGNOSIS — Z3480 Encounter for supervision of other normal pregnancy, unspecified trimester: Secondary | ICD-10-CM | POA: Insufficient documentation

## 2020-12-28 MED ORDER — PREPLUS 27-1 MG PO TABS: 1.0000 | ORAL_TABLET | Freq: Every day | ORAL | 13 refills | Status: DC

## 2020-12-28 MED ORDER — DOXYLAMINE-PYRIDOXINE 10-10 MG PO TBEC: 2.0000 | DELAYED_RELEASE_TABLET | Freq: Every day | ORAL | 5 refills | Status: DC

## 2020-12-28 MED ORDER — BLOOD PRESSURE KIT DEVI: 1.0000 | 0 refills | Status: DC

## 2020-12-28 NOTE — Progress Notes (Addendum)
New OB Intake  I connected with  Lisa Brown on 12/28/20 at  9:00 AM EST by in person Visit and verified that I am speaking with the correct person using two identifiers. Nurse is located at [facility] and pt is located at [place].  I explained I am completing New OB Intake today. We discussed her EDD of 08/09/21 that is based on LMP of 11/02/20. Pt is G5/P1. I reviewed her allergies, medications, Medical/Surgical/OB history, and appropriate screenings. I informed her of [facility] services. Based on history, this is a/an  pregnancy uncomplicated .   Patient Active Problem List   Diagnosis Date Noted   Pregnancy at early stage 12/08/2020   BMI 36.0-36.9,adult 10/06/2015   Marijuana use 10/06/2015    Concerns addressed today  Delivery Plans:  Plans to deliver at [facility].   MyChart/Babyscripts MyChart access verified. I explained pt will have some visits in office and some virtually. Babyscripts instructions given and order placed. Patient verifies receipt of registration text/e-mail. Account successfully created and app downloaded.  Blood Pressure Cuff  Blood pressure cuff ordered for patient to pick-up from [employer]. Explained after first prenatal appt pt will check weekly and document in Babyscripts.  Weight scale: Patient    have weight scale. Weight scale ordered   Anatomy US Explained first scheduled [place] will be around 19 weeks. Anatomy US scheduled for 19 wks at MFM. Pt notified to arrive at [facility].  Labs Discussed Avelina Laine genetic screening with patient. Would like both Panorama and Horizon drawn at new OB visit. Routine prenatal labs needed.  Covid Vaccine Patient has covid vaccine.   Social Determinants of Health Food Insecurity: Patient denies food insecurity. WIC Referral: Patient is interested in referral to [facility].  Transportation: Patient denies transportation needs. Childcare: Discussed no children allowed at ultrasound appointments. Offered childcare services; patient  declines childcare services at this time.  First visit review I reviewed new OB appt with pt. I explained she will have a pelvic exam, ob bloodwork with genetic screening, and PAP smear. Explained pt will be seen by Nolene Bernheim, NP at first visit; encounter routed to appropriate provider. Explained that patient will be seen by pregnancy navigator following visit with provider. [facility] information placed in AVS.   Harrel Lemon, RN 12/28/2020  9:07 AM    Chart reviewed for nurse visit. Agree with plan of care.   Currie Paris, NP 12/28/2020 4:07 PM

## 2021-01-04 ENCOUNTER — Encounter: Payer: Medicaid Other | Admitting: Obstetrics and Gynecology

## 2021-01-08 ENCOUNTER — Other Ambulatory Visit (HOSPITAL_COMMUNITY)
Admission: RE | Admit: 2021-01-08 | Discharge: 2021-01-08 | Disposition: A | Discharge status: Home / Self Care | Payer: Medicaid Other | Source: Ambulatory Visit | Attending: Nurse Practitioner | Admitting: Nurse Practitioner

## 2021-01-08 ENCOUNTER — Encounter: Payer: Self-pay | Admitting: Nurse Practitioner

## 2021-01-08 ENCOUNTER — Other Ambulatory Visit: Payer: Self-pay

## 2021-01-08 ENCOUNTER — Ambulatory Visit (INDEPENDENT_AMBULATORY_CARE_PROVIDER_SITE_OTHER): Payer: Medicaid Other | Admitting: Nurse Practitioner

## 2021-01-08 VITALS — BP 116/76 | HR 86 | Wt 270.0 lb

## 2021-01-08 DIAGNOSIS — Z3480 Encounter for supervision of other normal pregnancy, unspecified trimester: Secondary | ICD-10-CM | POA: Diagnosis not present

## 2021-01-08 DIAGNOSIS — Z3490 Encounter for supervision of normal pregnancy, unspecified, unspecified trimester: Secondary | ICD-10-CM | POA: Insufficient documentation

## 2021-01-08 DIAGNOSIS — B9689 Other specified bacterial agents as the cause of diseases classified elsewhere: Secondary | ICD-10-CM

## 2021-01-08 DIAGNOSIS — Z23 Encounter for immunization: Secondary | ICD-10-CM | POA: Diagnosis not present

## 2021-01-08 DIAGNOSIS — Z6836 Body mass index (BMI) 36.0-36.9, adult: Secondary | ICD-10-CM

## 2021-01-08 DIAGNOSIS — Z3A09 9 weeks gestation of pregnancy: Secondary | ICD-10-CM | POA: Diagnosis not present

## 2021-01-08 DIAGNOSIS — O210 Mild hyperemesis gravidarum: Secondary | ICD-10-CM | POA: Diagnosis not present

## 2021-01-08 DIAGNOSIS — N760 Acute vaginitis: Secondary | ICD-10-CM

## 2021-01-08 LAB — HEPATITIS C ANTIBODY: HCV Ab: NEGATIVE

## 2021-01-08 NOTE — Progress Notes (Signed)
NEW OB, c/o NV the Diclegis is not working. FLU vaccine given in LD, tolerated well.

## 2021-01-08 NOTE — Progress Notes (Signed)
Subjective:   Lisa Brown is a 30 y.o. G5P1031 at 59w4dby LMP being seen today for her first obstetrical visit.  Her obstetrical history is significant for obesity. Patient does intend to breast feed. Pregnancy history fully reviewed.  Patient reports  painful orgasms that began prior to pregnancy .  HISTORY: OB History  Gravida Para Term Preterm AB Living  _0 0 3 1  SAB IAB Ectopic Multiple Live Births  1 2 0 0 1    # Outcome Date GA Lbr Len/2nd Weight Sex Delivery Anes PTL Lv  5 Current           4 Term 04/06/16 [id number]:53 / 00:44 8 lb 0.2 oz (3.635 kg) F Vag-Spont None  LIV     Name: Romanello,GIRL Shamarra     Apgar1: 8  Apgar5: 9  3 IAB           2 IAB           1 SAB            Past Medical History:  Diagnosis Date   Asthma    Past Surgical History:  Procedure Laterality Date   NO PAST SURGERIES     Family History  Problem Relation Age of Onset   Diabetes Mother    Diabetes Father    Kidney disease Father    Hypertension Father    Liver disease Father    Cancer Maternal Grandmother    Diabetes Maternal Grandmother    Heart disease Maternal Grandmother    Diabetes Maternal Grandfather    Social History   Tobacco Use   Smoking status: Never   Smokeless tobacco: Never  Substance Use Topics   Alcohol use: No   Drug use: No   No Known Allergies Current Outpatient Medications on File Prior to Visit  Medication Sig Dispense Refill   Blood Pressure Monitoring (BLOOD PRESSURE KIT) DEVI 1 Device by Does not apply route once a week. 1 each 0   Doxylamine-Pyridoxine (DICLEGIS) 10-10 MG TBEC Take 2 tablets by mouth at bedtime. If symptoms persist, add one tablet in the morning and one in the afternoon 100 tablet 5   Prenatal Vit-Fe Fumarate-FA (PRENATAL VITAMIN PO) Take 1 tablet by mouth daily.     Prenatal Vit-Fe Fumarate-FA (PREPLUS) 27-1 MG TABS Take 1 tablet by mouth daily. 30 tablet 13   No current facility-administered medications on file prior to visit.      Exam   Vitals:   01/08/21 1329  BP: 116/76  Pulse: 86  Weight: 270 lb (122.5 kg)      Uterus:     Pelvic Exam: Perineum: no hemorrhoids, normal perineum   Vulva: normal external genitalia, no lesions   Vagina:  normal mucosa, normal discharge   Cervix: no lesions and normal, pap smear done.    Adnexa: normal adnexa and no mass, fullness, tenderness   Bony Pelvis: average  System: General: well-developed, well-nourished female in no acute distress   Breast:  normal appearance, no masses .  very tender bilaterally   Skin: normal coloration and turgor, no rashes   Neurologic: oriented, normal, negative, normal mood   Extremities: normal strength, tone, and muscle mass, ROM of all joints is normal   HEENT extraocular movement intact and sclera clear, anicteric   Mouth/Teeth deferred   Neck supple and no masses, normal thyroid   Cardiovascular: regular rate and rhythm   Respiratory:  no respiratory distress, normal  breath sounds   Abdomen: soft, non-tender; no masses,  no organomegaly     Assessment:   Pregnancy: T5V2023 Patient Active Problem List   Diagnosis Date Noted   Supervision of other normal pregnancy, antepartum 12/28/2020   BMI 36.0-36.9,adult 10/06/2015   Marijuana use 10/06/2015     Plan:  1. Supervision of other normal pregnancy, antepartum Will plan to wait for next visit for genetic testing - not yet 10 weeks RN could see FHT flutter with handheld [place] - still not able to hear FHT with doppler likely due to habitus Flu vaccine given today Discussed painful orgasms - likely due to uterine contractions after orgasm Plans waterbirth again and will attend class again  - Culture, OB Urine - Obstetric Panel, Including HIV - Hepatitis C Antibody - Cervicovaginal ancillary only( [place]) - Cytology - PAP( [place]) - [place] MFM OB COMP + 14 WK; Future  2. BMI 36.0-36.9,adult   3. Morning sickness Vomits a couple of times a day Finds that eating  hot dogs and fried rice there is no vomiting Advised small frequent mini meals See AVS for additional info given to patient  4. [redacted] weeks gestation of pregnancy    Initial labs drawn. Continue prenatal vitamins. Genetic Screening discussed,  NIPS postponed until next visit : . Ultrasound discussed; fetal anatomic survey: ordered. Problem list reviewed and updated. The nature of [facility] with multiple MDs and other Advanced Practice Providers was explained to patient; also emphasized that residents, students are part of our team. Routine obstetric precautions reviewed. Return in about 3 weeks (around 01/29/2021) for in person ROB with blood work.  Total face-to-face time with patient: 40 minutes.  Over 50% of encounter was spent on counseling and coordination of care.     Earlie Server, FNP Family Nurse Practitioner, [facility] for [employer], [place] [facility] 01/08/2021 2:25 PM

## 2021-01-09 LAB — OBSTETRIC PANEL, INCLUDING HIV
Antibody Screen: NEGATIVE
Basophils Absolute: 0 10*3/uL (ref 0.0–0.2)
Basos: 0 %
EOS (ABSOLUTE): 0.1 10*3/uL (ref 0.0–0.4)
Eos: 1 %
HIV Screen 4th Generation wRfx: NONREACTIVE
Hematocrit: 36.3 % (ref 34.0–46.6)
Hemoglobin: 12.1 g/dL (ref 11.1–15.9)
Hepatitis B Surface Ag: NEGATIVE
Immature Grans (Abs): 0 10*3/uL (ref 0.0–0.1)
Immature Granulocytes: 1 %
Lymphocytes Absolute: 3 10*3/uL (ref 0.7–3.1)
Lymphs: 36 %
MCH: 27.7 pg (ref 26.6–33.0)
MCHC: 33.3 g/dL (ref 31.5–35.7)
MCV: 83 fL (ref 79–97)
Monocytes Absolute: 0.7 10*3/uL (ref 0.1–0.9)
Monocytes: 8 %
Neutrophils Absolute: 4.4 10*3/uL (ref 1.4–7.0)
Neutrophils: 54 %
Platelets: 312 10*3/uL (ref 150–450)
RBC: 4.37 x10E6/uL (ref 3.77–5.28)
RDW: 13.4 % (ref 11.7–15.4)
RPR Ser Ql: NONREACTIVE
Rh Factor: POSITIVE
Rubella Antibodies, IGG: 1.18 index (ref >0.99)
WBC: 8.2 10*3/uL (ref 3.4–10.8)

## 2021-01-09 LAB — CERVICOVAGINAL ANCILLARY ONLY
Bacterial Vaginitis (gardnerella): POSITIVE — AB
Candida Glabrata: NEGATIVE
Candida Vaginitis: NEGATIVE
Chlamydia: NEGATIVE
Comment: NEGATIVE
Comment: NEGATIVE
Comment: NEGATIVE
Comment: NEGATIVE
Comment: NEGATIVE
Comment: NORMAL
Neisseria Gonorrhea: NEGATIVE
Trichomonas: NEGATIVE

## 2021-01-09 LAB — HEPATITIS C ANTIBODY: Hep C Virus Ab: <0.1 s/co ratio (ref 0.0–0.9)

## 2021-01-10 ENCOUNTER — Encounter: Payer: Self-pay | Admitting: Nurse Practitioner

## 2021-01-10 DIAGNOSIS — R87610 Atypical squamous cells of undetermined significance on cytologic smear of cervix (ASC-US): Secondary | ICD-10-CM | POA: Insufficient documentation

## 2021-01-10 LAB — CYTOLOGY - PAP
Comment: NEGATIVE
Diagnosis: UNDETERMINED — AB
High risk HPV: NEGATIVE

## 2021-01-10 LAB — CULTURE, OB URINE

## 2021-01-10 LAB — URINE CULTURE, OB REFLEX

## 2021-01-10 MED ORDER — METRONIDAZOLE 0.75 % VA GEL: 1.0000 | Freq: Every day | VAGINAL | 0 refills | Status: AC

## 2021-01-10 NOTE — Addendum Note (Signed)
Addended by: Currie Paris on: 01/10/2021 09:32 AM   Modules accepted: Orders

## 2021-01-21 NOTE — L&D Delivery Note (Signed)
OB/GYN Faculty Practice Delivery Note  Lisa Brown is a 31 y.o. V4M0867 s/p SVD at [redacted]w[redacted]d. She was admitted for IOL d/t GDMA1.   ROM: 2h 110m with clear fluid GBS Status: Positive Maximum Maternal Temperature: 98.5  Labor Progress: Patient was given one dose of cytotec and progressed without further medication.  At 8cm she reentered the waterbirth tub and was encouraged to push when the urge arised.  She delivered without incident.    Delivery Date/Time: Friday August 10, 2021 at 1246 Delivery: Patient pushing and head delivered. Maternal pushing efforts ceased and patient encouraged to push.  After ~ 30 seconds maternal efforts and provider manipulation release of posterior shoulder occurred allowing for somersault maneuver and delivery of infant body at the top of the water.   Infant with good tone and no grimace, but immediately placed on mother's abdomen, where nurses dried and stimulated while provider clamped cord x 2.  FOB-Darius allowed to cut cord and infant taken to warmer where nurses provided care. Cord gases collected and sent. Patient removed from tub and cord blood drawn. Pitocin initiated and placenta expressed, after 35 minutes, with vigorous fundal massage. Fundus remained firm and Labia, perineum, and vagina inspected and found to be without lacerations.  Family wishes for infant to be circumcised during inpatient stay.  Mother desires BTL for birth control, but does not desire for it to occur today.  Placenta: Intact, 3VC, Home with Patient  Complications: None Lacerations: None EBL: Analgesia: None  Postpartum Planning [X]  MD notified of patient desire for BTL [X]  Follow Up Message Sent [X]  Discharge Summary  Infant: Female-[name]  APGARs 4, 6  3650g  MSN, CNM Advanced Practice Provider, Center for 

## 2021-01-22 ENCOUNTER — Encounter (HOSPITAL_COMMUNITY): Payer: Self-pay | Admitting: Obstetrics & Gynecology

## 2021-01-22 ENCOUNTER — Other Ambulatory Visit: Payer: Self-pay

## 2021-01-22 ENCOUNTER — Inpatient Hospital Stay (HOSPITAL_COMMUNITY)
Admission: AD | Admit: 2021-01-22 | Discharge: 2021-01-22 | Disposition: A | Discharge status: Home / Self Care | Payer: Medicaid Other | Attending: Obstetrics & Gynecology | Admitting: Obstetrics & Gynecology

## 2021-01-22 DIAGNOSIS — O209 Hemorrhage in early pregnancy, unspecified: Secondary | ICD-10-CM | POA: Insufficient documentation

## 2021-01-22 DIAGNOSIS — Z3A11 11 weeks gestation of pregnancy: Secondary | ICD-10-CM | POA: Diagnosis not present

## 2021-01-22 DIAGNOSIS — N760 Acute vaginitis: Secondary | ICD-10-CM | POA: Diagnosis not present

## 2021-01-22 DIAGNOSIS — O23591 Infection of other part of genital tract in pregnancy, first trimester: Secondary | ICD-10-CM | POA: Insufficient documentation

## 2021-01-22 DIAGNOSIS — O26891 Other specified pregnancy related conditions, first trimester: Secondary | ICD-10-CM

## 2021-01-22 DIAGNOSIS — N898 Other specified noninflammatory disorders of vagina: Secondary | ICD-10-CM

## 2021-01-22 DIAGNOSIS — B9689 Other specified bacterial agents as the cause of diseases classified elsewhere: Secondary | ICD-10-CM | POA: Diagnosis not present

## 2021-01-22 LAB — WET PREP, GENITAL
Sperm: NONE SEEN
Trich, Wet Prep: NONE SEEN
Yeast Wet Prep HPF POC: NONE SEEN

## 2021-01-22 LAB — URINALYSIS, ROUTINE W REFLEX MICROSCOPIC
Bilirubin Urine: NEGATIVE
Glucose, UA: NEGATIVE mg/dL
Hgb urine dipstick: NEGATIVE
Ketones, ur: NEGATIVE mg/dL
Leukocytes,Ua: NEGATIVE
Nitrite: NEGATIVE
Protein, ur: NEGATIVE mg/dL
Specific Gravity, Urine: 1.021 (ref 1.005–1.030)
pH: 8 (ref 5.0–8.0)

## 2021-01-22 MED ORDER — TERCONAZOLE 0.4 % VA CREA: 1.0000 | TOPICAL_CREAM | Freq: Every day | VAGINAL | 0 refills | Status: DC

## 2021-01-22 MED ORDER — METRONIDAZOLE 500 MG PO TABS: 500.0000 mg | ORAL_TABLET | Freq: Two times a day (BID) | ORAL | 0 refills | Status: AC

## 2021-01-22 NOTE — MAU Note (Signed)
Lisa Brown is a 31 y.o. at [redacted]w[redacted]d here in MAU reporting: has been cramping for her whole pregnancy. Today noticed some spotting. States she saw the bleeding when she wiped.   Onset of complaint: ongoing  Pain score: 7/10  Vitals:   01/22/21 1237  BP: 119/73  Pulse: 82  Resp: 18  Temp: 98.1 F (36.7 C)  SpO2: 100%     FHT:152  Lab orders placed from triage: UA

## 2021-01-22 NOTE — MAU Provider Note (Signed)
History     CSN: [id number]  Arrival date and time: 01/22/21 1207   Event Date/Time   First Provider Initiated Contact with Patient 01/22/21 1444      Chief Complaint  Patient presents with   Abdominal Pain   Vaginal Bleeding   HPI  Lisa Brown is a 31 y.o. female [phone] @ [redacted]w[redacted]d here in MAU with abdominal pain and pink vaginal discharge. The pain started a few days ago. Spotting started today. The spotting was pink/brown. The amount is small. This is a new problem. Laying down makes her pain worse in the RLQ. Sitting still improves the pain.     No sex in the last 24 hours.   OB History     Gravida  5   Para  1   Term  1   Preterm  0   AB  3   Living  1      SAB  1   IAB  2   Ectopic  0   Multiple  0   Live Births  1           Past Medical History:  Diagnosis Date   Asthma     Past Surgical History:  Procedure Laterality Date   NO PAST SURGERIES      Family History  Problem Relation Age of Onset   Diabetes Mother    Diabetes Father    Kidney disease Father    Hypertension Father    Liver disease Father    Cancer Maternal Grandmother    Diabetes Maternal Grandmother    Heart disease Maternal Grandmother    Diabetes Maternal Grandfather     Social History   Tobacco Use   Smoking status: Never   Smokeless tobacco: Never  Substance Use Topics   Alcohol use: No   Drug use: No    Allergies: No Known Allergies  Medications Prior to Admission  Medication Sig Dispense Refill Last Dose   Blood Pressure Monitoring (BLOOD PRESSURE KIT) DEVI 1 Device by Does not apply route once a week. 1 each 0    Doxylamine-Pyridoxine (DICLEGIS) 10-10 MG TBEC Take 2 tablets by mouth at bedtime. If symptoms persist, add one tablet in the morning and one in the afternoon 100 tablet 5    Prenatal Vit-Fe Fumarate-FA (PRENATAL VITAMIN PO) Take 1 tablet by mouth daily.      Prenatal Vit-Fe Fumarate-FA (PREPLUS) 27-1 MG TABS Take 1 tablet by mouth daily.  30 tablet 13    Results for orders placed or performed during the hospital encounter of 01/22/21 (from the past 48 hour(s))  Urinalysis, Routine w reflex microscopic Urine, Clean Catch     Status: Abnormal   Collection Time: 01/22/21  2:24 PM  Result Value Ref Range   Color, Urine YELLOW YELLOW   APPearance HAZY (A) CLEAR   Specific Gravity, Urine 1.021 1.005 - 1.030   pH 8.0 5.0 - 8.0   Glucose, UA NEGATIVE NEGATIVE mg/dL   Hgb urine dipstick NEGATIVE NEGATIVE   Bilirubin Urine NEGATIVE NEGATIVE   Ketones, ur NEGATIVE NEGATIVE mg/dL   Protein, ur NEGATIVE NEGATIVE mg/dL   Nitrite NEGATIVE NEGATIVE   Leukocytes,Ua NEGATIVE NEGATIVE    Comment: Performed at [facility], [address], [place], [place] [address]  Wet prep, genital     Status: Abnormal   Collection Time: 01/22/21  3:28 PM  Result Value Ref Range   Yeast Wet Prep HPF POC NONE SEEN NONE SEEN  Trich, Wet Prep NONE SEEN NONE SEEN   Clue Cells Wet Prep HPF POC FEW (A) NONE SEEN   WBC, Wet Prep HPF POC FEW (A) <10   Sperm NONE SEEN     Comment: Performed at [facility], [place] [address], [place], [place] [address]     Review of Systems  Constitutional:  Negative for fever.  Gastrointestinal:  Positive for abdominal pain.  Genitourinary:  Positive for vaginal bleeding and vaginal discharge.  Physical Exam   Blood pressure 119/69, pulse 75, temperature 98.1 F (36.7 C), temperature source Oral, resp. rate 18, height $RemoveBe'5\' 11"'DLvzlcgLh$  (1.803 m), weight 124.2 kg, last menstrual period 11/02/2020, SpO2 100 %, currently breastfeeding.  Physical Exam Vitals and nursing note reviewed.  Constitutional:      General: She is not in acute distress.    Appearance: She is well-developed. She is not ill-appearing, toxic-appearing or diaphoretic.  Genitourinary:    Comments: Vagina - Small amount of pink, milky white vaginal discharge, no odor  Cervix - No contact bleeding, no active bleeding  Bimanual exam: Cervix  closed GC/Chlam, wet prep done Chaperone present for exam.   Neurological:     Mental Status: She is alert.    MAU Course  Procedures  Pt informed that the ultrasound is considered a limited OB ultrasound and is not intended to be a complete ultrasound exam.  Patient also informed that the ultrasound is not being completed with the intent of assessing for fetal or placental anomalies or any pelvic abnormalities.  Explained that the purpose of todays ultrasound is to assess for  viability.  Patient acknowledges the purpose of the exam and the limitations of the study.   Active fetus.   MDM  O positive blood type Bedside US Wet prep and gc collected.   Assessment and Plan    A:  1. Vaginal discharge during pregnancy in first trimester   2. [redacted] weeks gestation of pregnancy   3. Bacterial vaginosis      P:  Discharge home in stable condition Rx: Flagyl  Return to MAU if symptoms worsen   Arnette Driggs, Artist Pais, NP 01/22/2021 5:08 PM

## 2021-01-23 LAB — GC/CHLAMYDIA PROBE AMP (~~LOC~~) NOT AT ARMC
Chlamydia: NEGATIVE
Comment: NEGATIVE
Comment: NORMAL
Neisseria Gonorrhea: NEGATIVE

## 2021-01-29 ENCOUNTER — Other Ambulatory Visit: Payer: Self-pay

## 2021-01-29 ENCOUNTER — Ambulatory Visit (INDEPENDENT_AMBULATORY_CARE_PROVIDER_SITE_OTHER): Payer: Medicaid Other | Admitting: Advanced Practice Midwife

## 2021-01-29 VITALS — BP 122/77 | HR 91 | Wt 275.0 lb

## 2021-01-29 DIAGNOSIS — Z3480 Encounter for supervision of other normal pregnancy, unspecified trimester: Secondary | ICD-10-CM

## 2021-01-29 DIAGNOSIS — Z3A12 12 weeks gestation of pregnancy: Secondary | ICD-10-CM | POA: Diagnosis not present

## 2021-01-29 DIAGNOSIS — O209 Hemorrhage in early pregnancy, unspecified: Secondary | ICD-10-CM

## 2021-01-29 NOTE — Progress Notes (Signed)
° °  PRENATAL VISIT NOTE  Subjective:  Lisa Brown is a 31 y.o. G5P1031 at [redacted]w[redacted]d being seen today for ongoing prenatal care.  She is currently monitored for the following issues for this low-risk pregnancy and has BMI 36.0-36.9,adult; Marijuana use; Supervision of other normal pregnancy, antepartum; and ASCUS of cervix with negative high risk HPV on their problem list.  Patient reports no complaints.  Contractions: Not present. Vag. Bleeding: Scant.   . Denies leaking of fluid.   The following portions of the patient's history were reviewed and updated as appropriate: allergies, current medications, past family history, past medical history, past social history, past surgical history and problem list.   Objective:   Vitals:   01/29/21 1515  BP: 122/77  Pulse: 91  Weight: 275 lb (124.7 kg)    Fetal Status: Fetal Heart Rate (bpm): 140         General:  Alert, oriented and cooperative. Patient is in no acute distress.  Skin: Skin is warm and dry. No rash noted.   Cardiovascular: Normal heart rate noted  Respiratory: Normal respiratory effort, no problems with respiration noted  Abdomen: Soft, gravid, appropriate for gestational age.  Pain/Pressure: Absent     Pelvic: Cervical exam deferred        Extremities: Normal range of motion.     Mental Status: Normal mood and affect. Normal behavior. Normal judgment and thought content.   Assessment and Plan:  Pregnancy: G5P1031 at [redacted]w[redacted]d 1. Supervision of other normal pregnancy, antepartum --Anticipatory guidance about next visits/weeks of pregnancy given. --Interested in waterbirth, questions answered. Pt to enroll in class, see midwives for most visits. Discussed waterbirth as option for low risk pregnancy. Pregnancy is hard to predict and things may arise that prevent immersion in water but labor can be supported with freedom of movement, birthing ball, shower and birth can be supported in position of pt choosing, etc, even if waterbirth  becomes unavailable. Pt states understanding. --Pt took class with previous pregnancy but was 5 years ago, and new partner, so I recommend taking new class but not required.  --next appt in 4 weeks  2. [redacted] weeks gestation of pregnancy  3. Vaginal bleeding in pregnancy, first trimester --Pt had MAU visit on 01/22/21 with vaginal bleeding and was treated for BV --Bleeding is lighter but still pink/light red when wiping. There is no pain with the bleeding. --SSE today with slightly friable cervix, Metrogel noted in vaginal vault.  Pt completed 4 of 5 days of Metrogel. I recommend stopping the Metrogel, which could be irritating the cervix.  --Good FHT today in the office. No SCH noted on previous first trimester [place] --F/U with outpatient [place] for bleeding  - US OB Transvaginal; Future   Preterm labor symptoms and general obstetric precautions including but not limited to vaginal bleeding, contractions, leaking of fluid and fetal movement were reviewed in detail with the patient. Please refer to After Visit Summary for other counseling recommendations.   Return in about 4 weeks (around 02/26/2021).  Future Appointments  Date Time Provider Department Center  02/05/2021 11:00 AM [facility] US1 [facility]  03/01/2021 10:35 AM Gerrit Heck, CNM [facility] None  03/15/2021 10:15 AM [facility] US2 [facility]    Sharen Counter, CNM

## 2021-01-29 NOTE — Patient Instructions (Signed)
?  Considering Waterbirth? ?Guide for patients at [facility] ([facility]) ?Why consider waterbirth? ?Gentle birth for babies  ?Less pain medicine used in labor  ?May allow for passive descent/less pushing  ?May reduce perineal tears  ?More mobility and instinctive maternal position changes  ?Increased maternal relaxation  ? ?Is waterbirth safe? What are the risks of infection, drowning or other complications? ?Infection:  ?Very low risk (3.7 % for tub vs 4.8% for bed)  ?7 in 8000 waterbirths with documented infection  ?Poorly cleaned equipment most common cause  ?Slightly lower group B strep transmission rate  ?Drowning  ?Maternal:  ?Very low risk  ?Related to seizures or fainting  ?Newborn:  ?Very low risk. No evidence of increased risk of respiratory problems in multiple large studies  ?Physiological protection from breathing under water  ?Avoid underwater birth if there are any fetal complications  ?Once baby's head is out of the water, keep it out.  ?Birth complication  ?Some reports of cord trauma, but risk decreased by bringing baby to surface gradually  ?No evidence of increased risk of shoulder dystocia. Mothers can usually change positions faster in water than in a bed, possibly aiding the maneuvers to free the shoulder.  ? ?There are 2 things you MUST do to have a waterbirth with [facility]: ?Attend a waterbirth class at [facility]   ?3rd Wednesday of every month from 7-9 pm (virtual during COVID) ?Free ?Register online at [email or web address] or [email or web address] or by calling [phone] ?Bring us the certificate from the class to your prenatal appointment or send via MyChart ?Meet with a midwife at 36 weeks* to see if you can still plan a waterbirth and to sign the consent.  ? ?*We also recommend that you schedule as many of your prenatal visits with a midwife as possible.   ? ?Helpful information: ?You may want to bring a bathing suit top to the hospital  to wear during labor but this is optional.  All other supplies are provided by the hospital. ?Please arrive at the hospital with signs of active labor, and do not wait at home until late in labor. It takes 45 min- 2 hours for COVID testing, fetal monitoring, and check in to your room to take place, plus transport and filling of the waterbirth tub.   ? ?Things that would prevent you from having a waterbirth: ?Unknown or Positive COVID-19 diagnosis upon admission to hospital* ?Premature, <37wks  ?Previous cesarean birth  ?Presence of thick meconium-stained fluid  ?Multiple gestation (Twins, triplets, etc.)  ?Uncontrolled diabetes or gestational diabetes requiring medication  ?Hypertension diagnosed in pregnancy or preexisting hypertension (gestational hypertension, preeclampsia, or chronic hypertension) ?Fetal growth restriction (your baby measures less than 10th percentile on ultrasound) ?Heavy vaginal bleeding  ?Non-reassuring fetal heart rate  ?Active infection (MRSA, etc.). Group B Strep is NOT a contraindication for waterbirth.  ?If your labor has to be induced and induction method requires continuous monitoring of the baby's heart rate  ?Other risks/issues identified by your obstetrical provider  ? ?Please remember that birth is unpredictable. Under certain unforeseeable circumstances your provider may advise against giving birth in the tub. These decisions will be made on a case-by-case basis and with the safety of you and your baby as our highest priority. ? ? ?*Please remember that in order to have a waterbirth, you must test Negative to COVID-19 upon admission to the hospital. ? ?Updated 05/01/20 ? ?

## 2021-02-05 ENCOUNTER — Other Ambulatory Visit: Payer: Self-pay | Admitting: Advanced Practice Midwife

## 2021-02-05 ENCOUNTER — Ambulatory Visit
Admission: RE | Admit: 2021-02-05 | Discharge: 2021-02-05 | Disposition: A | Discharge status: Home / Self Care | Payer: Medicaid Other | Source: Ambulatory Visit | Attending: Advanced Practice Midwife | Admitting: Advanced Practice Midwife

## 2021-02-05 ENCOUNTER — Other Ambulatory Visit: Payer: Self-pay

## 2021-02-05 DIAGNOSIS — O209 Hemorrhage in early pregnancy, unspecified: Secondary | ICD-10-CM | POA: Insufficient documentation

## 2021-02-05 IMAGING — US US OB COMP LESS 14 WK
1 series · 15 of 28 positions shown · non-contrast
Comparison: [DATE].

CLINICAL DATA: Vaginal bleeding in first trimester.

EXAM:
OBSTETRIC <14 WK ULTRASOUND
TECHNIQUE: Transabdominal ultrasound was performed for evaluation of the
gestation as well as the maternal uterus and adnexal regions.

[Series 1: us ob comp less 14 wk · 33 acquisitions, 15 frames shown]
[im 1/33]
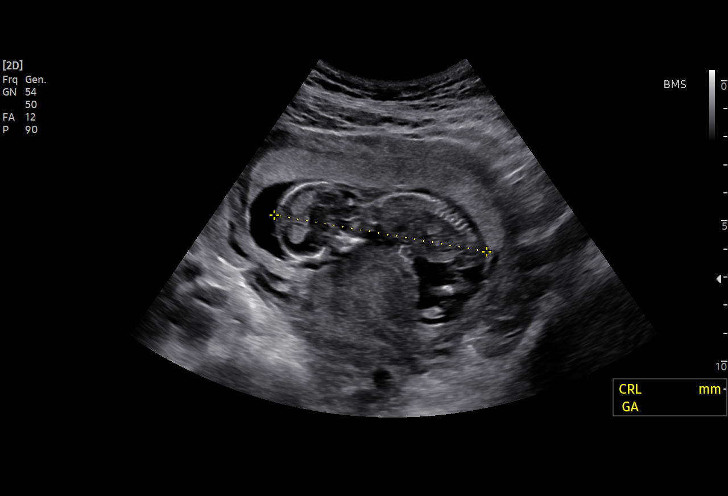
[im 3/33]
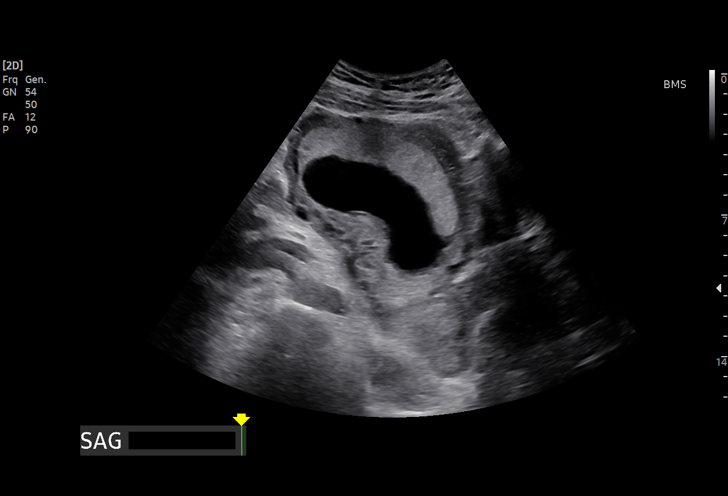
[im 5/33]
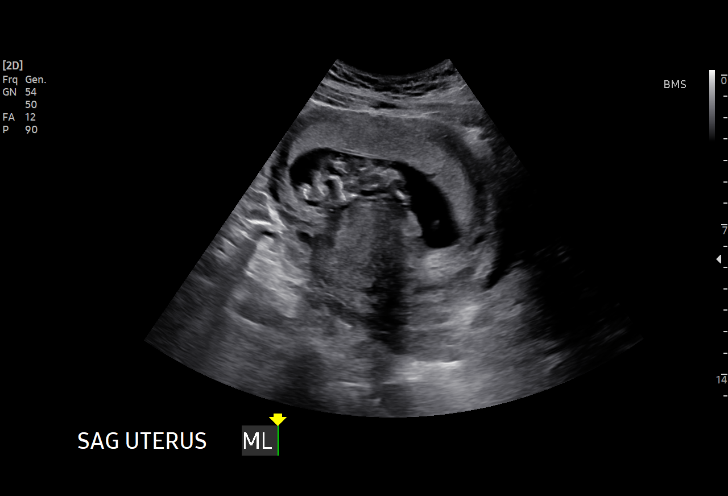
[im 8/33]
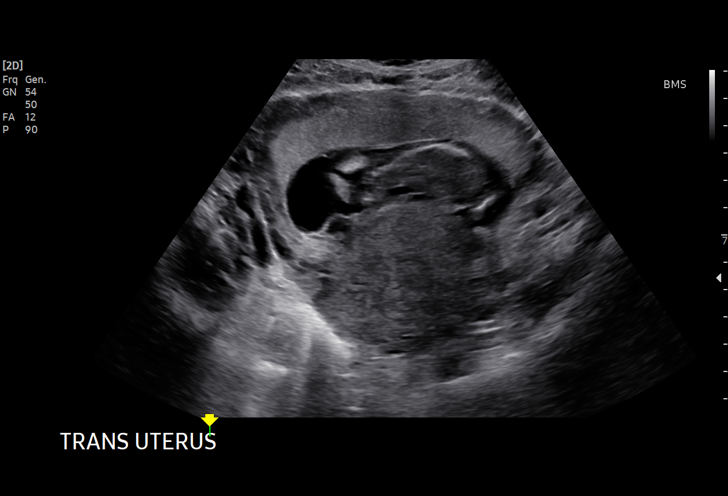
[im 10/33]
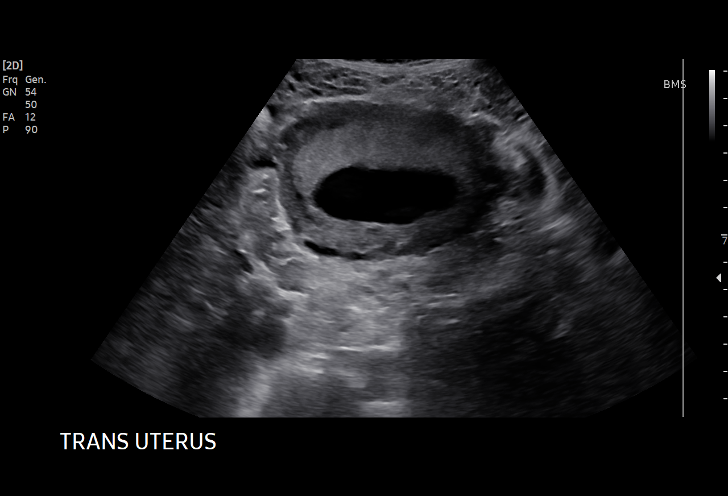
[im 12/33]
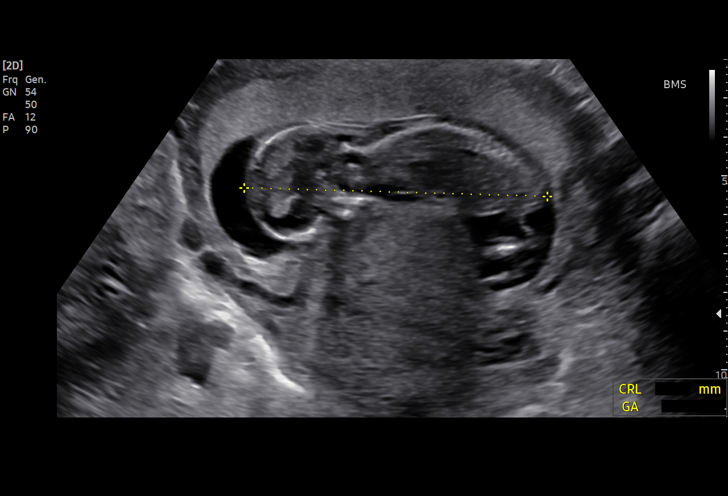
[im 15/33]
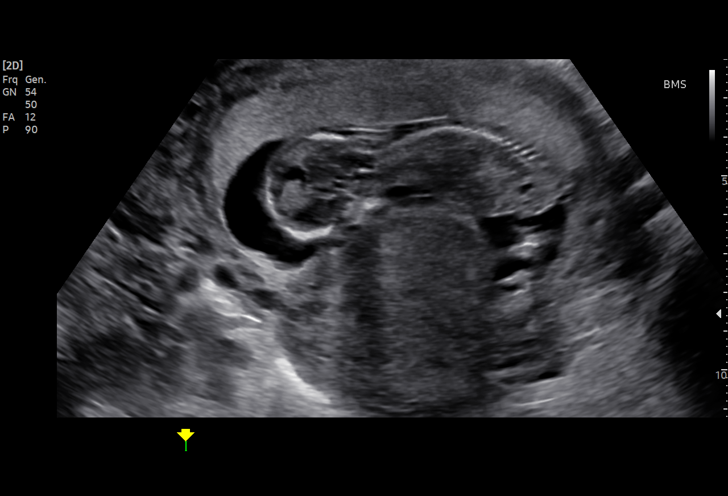
[im 17/33]
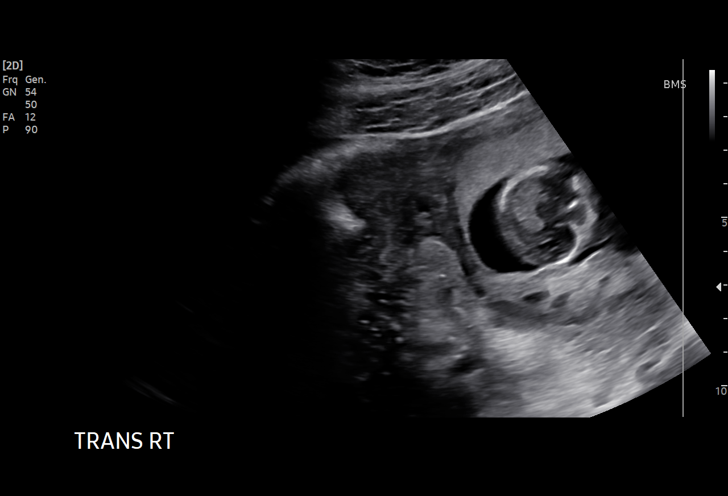
[im 18/33]
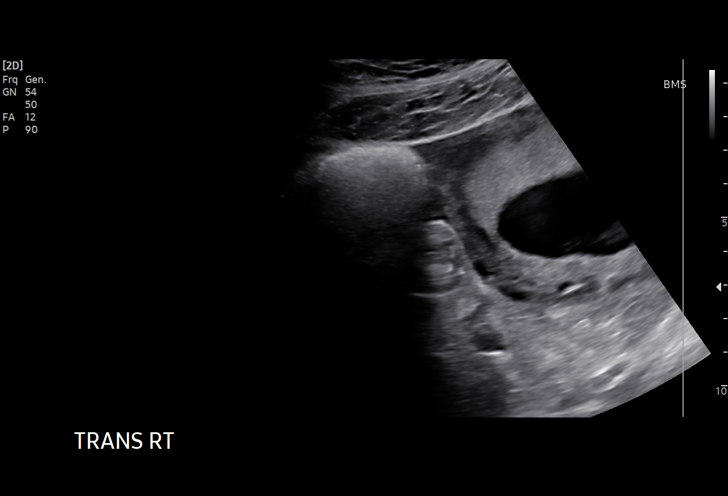
[im 21/33]
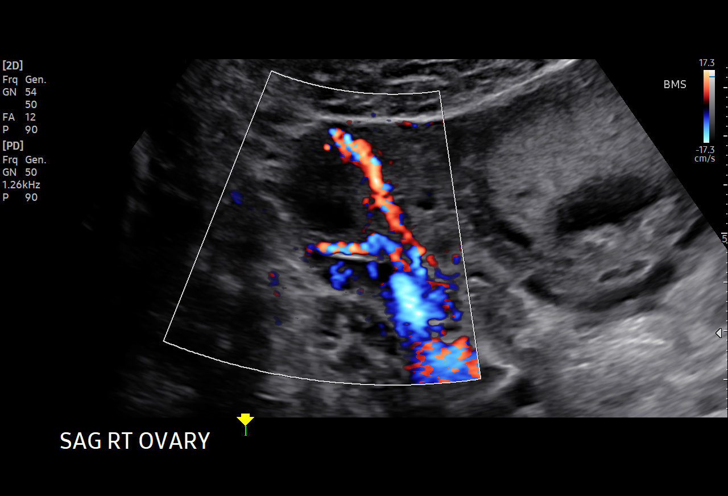
[im 23/33]
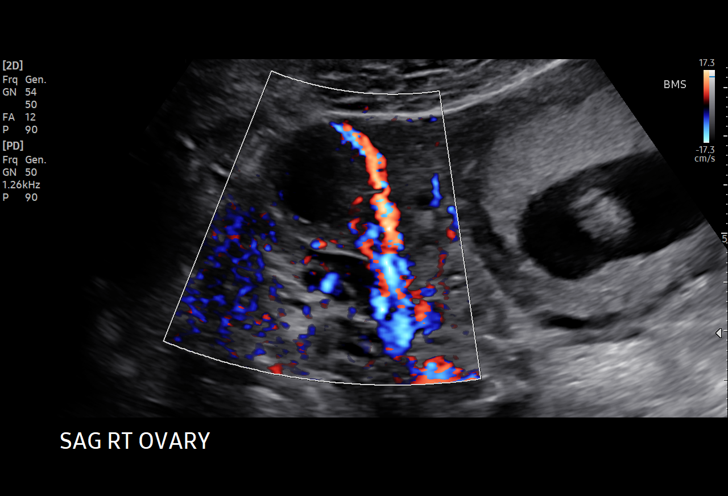
[im 25/33]
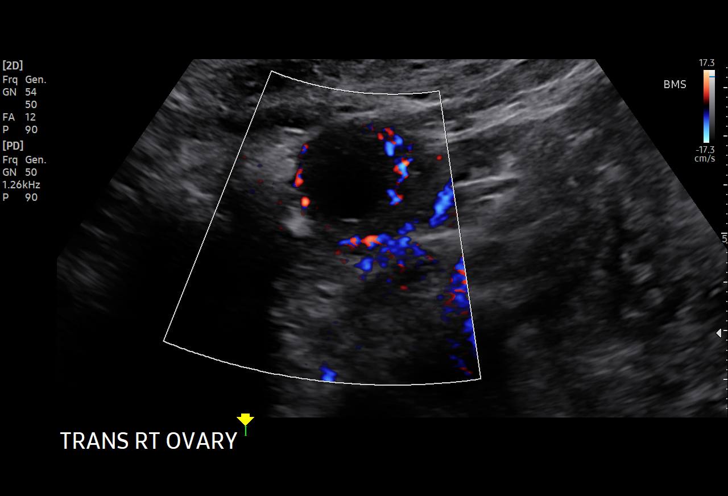
[im 28/33]
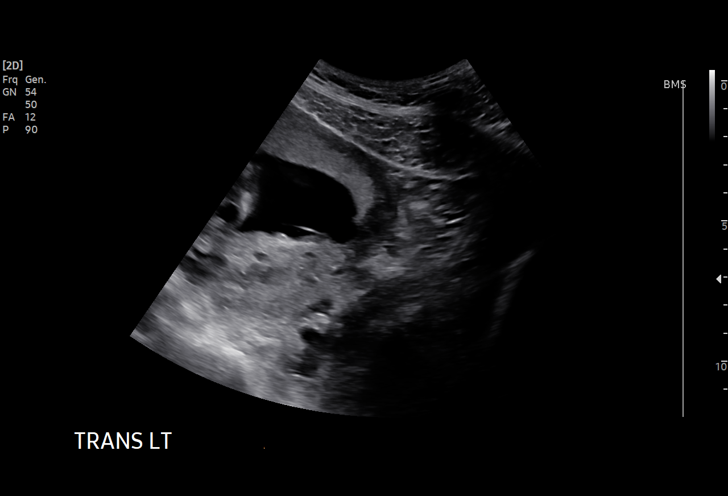
[im 30/33]
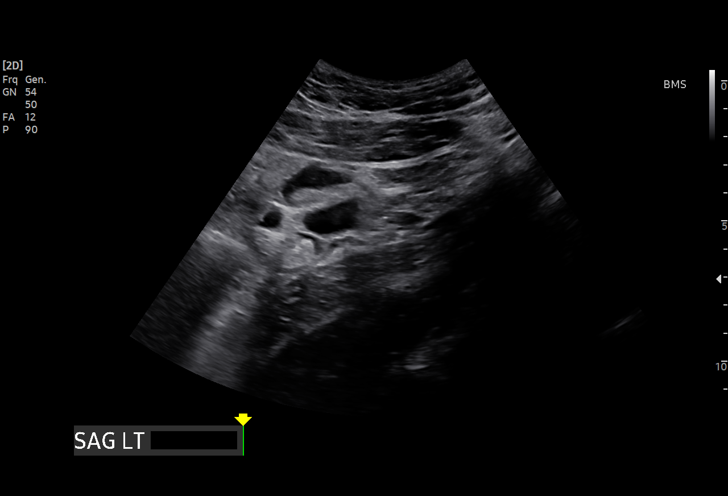
[im 33/33]
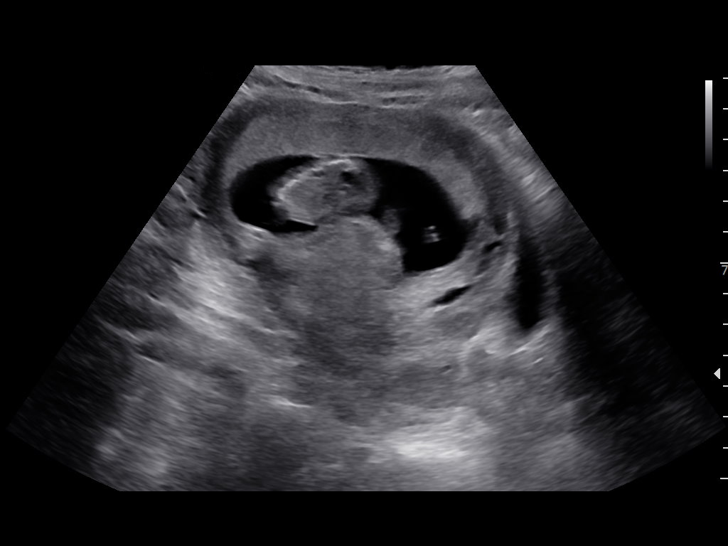

[15 of 28 positions shown; findings below may reference images not displayed]

FINDINGS: Intrauterine gestational sac: Single.

Yolk sac:  Absent.

Embryo:  Present.

Cardiac Activity: Present.

Heart Rate: 144 bpm

CRL:   77.9 mm   13 w 6 d                  US EDC: [DATE]

Subchorionic hemorrhage:  None visualized.

Maternal uterus/adnexae: 3.6 x 3.9 x 3.8 cm hypoechoic lesion in the
posterior uterine body. Unremarkable right ovary. Left ovary not
visualized.
IMPRESSION: 1. Single living intrauterine pregnancy, 13 weeks 6 days, EDC
[DATE]. No complicating. No evidence of subchorionic hemorrhage.
2. Uterine fibroid.

## 2021-02-06 ENCOUNTER — Telehealth: Payer: Self-pay | Admitting: Medical

## 2021-02-06 NOTE — Telephone Encounter (Signed)
I called Lisa Brown today at 3:48 PM and confirmed patient's identity using two patient identifiers. [place] results from yesterday were reviewed. Patient is scheduled for her next OB visit at [facility] on 03/01/21. First trimester warning signs reviewed. Patient voiced understanding and had no further questions.   US OB Comp Less 14 Wks  Result Date: 02/05/2021 CLINICAL DATA:  Vaginal bleeding in first trimester. EXAM: OBSTETRIC <14 WK ULTRASOUND TECHNIQUE: Transabdominal ultrasound was performed for evaluation of the gestation as well as the maternal uterus and adnexal regions. COMPARISON:  12/20/2020. FINDINGS: Intrauterine gestational sac: Single. Yolk sac:  Absent. Embryo:  Present. Cardiac Activity: Present. Heart Rate: 144 bpm CRL:   77.9 mm   13 w 6 d                  [place] EDC: 08/07/2021 Subchorionic hemorrhage:  None visualized. Maternal uterus/adnexae: 3.6 x 3.9 x 3.8 cm hypoechoic lesion in the posterior uterine body. Unremarkable right ovary. Left ovary not visualized. IMPRESSION: 1. Single living intrauterine pregnancy, 13 weeks 6 days, [facility] 08/07/2021. No complicating. No evidence of subchorionic hemorrhage. 2. Uterine fibroid. Electronically Signed   By: Leanna Battles M.D.   On: 02/05/2021 11:30     Kathlene Cote 02/06/2021 3:48 PM

## 2021-02-07 ENCOUNTER — Encounter: Payer: Self-pay | Admitting: Advanced Practice Midwife

## 2021-02-14 ENCOUNTER — Encounter: Payer: Self-pay | Admitting: Advanced Practice Midwife

## 2021-02-14 ENCOUNTER — Telehealth: Payer: Self-pay | Admitting: *Deleted

## 2021-02-14 ENCOUNTER — Encounter: Payer: Self-pay | Admitting: *Deleted

## 2021-02-14 DIAGNOSIS — Z3480 Encounter for supervision of other normal pregnancy, unspecified trimester: Secondary | ICD-10-CM

## 2021-02-14 NOTE — Telephone Encounter (Signed)
TC to notify patient of referral for genetic testing for [employer] report of increased carrier risk for spinal muscular atrophy. No answer. No VM available. MyChart message sent. Order placed for MFM genetic counseling per Dr. Donavan Foil.

## 2021-02-15 ENCOUNTER — Other Ambulatory Visit: Payer: Self-pay | Admitting: *Deleted

## 2021-02-15 ENCOUNTER — Telehealth: Payer: Self-pay | Admitting: *Deleted

## 2021-02-15 DIAGNOSIS — Z3480 Encounter for supervision of other normal pregnancy, unspecified trimester: Secondary | ICD-10-CM

## 2021-02-15 NOTE — Telephone Encounter (Signed)
Returned TC. Patient concerned about horizon results. Reassured patient of carrier, not active status and that there is not an urgent treatment needed. Advised patient to wait to speak with genetic counselor for further information and next steps. Patient verbalized understanding.

## 2021-02-26 ENCOUNTER — Encounter: Payer: Self-pay | Admitting: Advanced Practice Midwife

## 2021-03-01 ENCOUNTER — Ambulatory Visit: Payer: Medicaid Other | Attending: Obstetrics and Gynecology

## 2021-03-01 ENCOUNTER — Ambulatory Visit (INDEPENDENT_AMBULATORY_CARE_PROVIDER_SITE_OTHER): Payer: Medicaid Other

## 2021-03-01 ENCOUNTER — Other Ambulatory Visit: Payer: Self-pay

## 2021-03-01 VITALS — BP 123/78 | HR 88 | Wt 276.0 lb

## 2021-03-01 DIAGNOSIS — O99210 Obesity complicating pregnancy, unspecified trimester: Secondary | ICD-10-CM

## 2021-03-01 DIAGNOSIS — Z315 Encounter for genetic counseling: Secondary | ICD-10-CM | POA: Diagnosis not present

## 2021-03-01 DIAGNOSIS — Z3A17 17 weeks gestation of pregnancy: Secondary | ICD-10-CM

## 2021-03-01 DIAGNOSIS — R519 Headache, unspecified: Secondary | ICD-10-CM

## 2021-03-01 DIAGNOSIS — O26892 Other specified pregnancy related conditions, second trimester: Secondary | ICD-10-CM

## 2021-03-01 DIAGNOSIS — Z3480 Encounter for supervision of other normal pregnancy, unspecified trimester: Secondary | ICD-10-CM

## 2021-03-01 MED ORDER — MAGNESIUM OXIDE -MG SUPPLEMENT 400 MG PO CAPS: 400.0000 mg | ORAL_CAPSULE | Freq: Every day | ORAL | 1 refills | Status: DC

## 2021-03-01 NOTE — Progress Notes (Signed)
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   EDC: 08/09/2021 LMP: 11/02/2020 Referring Provider:  Griffin Basil, MD  EGA: [redacted]w[redacted]d Genetic Counselor: Staci Righter, MS, [place]  OB Hx: [id number] Date of Appointment: 03/01/2021  Accompanied by: Father of the current pregnancy Marlaine Hind) Face to Face Time: 54 Minutes   Previous Testing Completed: Museum/gallery conservator previously completed Non-Invasive Prenatal Screening (NIPS) in this pregnancy (scanned into Epic under the Media tab). The result is low risk. This screening significantly reduces the risk that the current pregnancy has Down syndrome, Trisomy 6, Trisomy 13, Monosomy X, and Triploidy, however, the risk is not zero given the limitations of NIPS. Additionally, there are many genetic conditions that cannot be detected by NIPS.  Lorrine previously completed carrier screening (scanned into Epic under the Media tab). She screened to have an increased chance to be a silent carrier for Spinal Muscular Atrophy (SMA). She screened to not be a carrier for Cystic Fibrosis (CF), alpha thalassemia, and beta hemoglobinopathies (including Sickle Cell Disease). A negative result on carrier screening reduces the likelihood of being a carrier, however, does not entirely rule out the possibility.   Medical History:  This is Maris's 6th pregnancy. She has one living, healthy daughter from a previous partner. She has had 4 losses, 3 of which were elective. Reports she takes prenatal vitamins. Denies personal history of diabetes, high blood pressure, thyroid conditions, and seizures. Denies bleeding, infections, and fevers in this pregnancy. Denies using tobacco, alcohol, or street drugs in this pregnancy.   Family History: A pedigree was created and scanned into Epic under the Media tab. Jourden reports her paternal aunt is affected with Sickle Cell Disease. Braiden screened to not be a carrier for Sickle Cell  Disease on her Horizon carrier screening. Maternal ethnicity reported as African American and paternal ethnicity reported as African American. Denies Ashkenazi Jewish ancestry. Family history not remarkable for consanguinity, individuals with birth defects, intellectual disability, autism spectrum disorder, multiple spontaneous abortions, still births, or unexplained neonatal death.     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 SMA is an autosomal recessive disease caused by loss of function mutations in the SMN1 gene. Maicy screened to have two functional copies of the SMN1 gene, however, due to limitations of genetic screening the laboratory cannot confirm if Josaphine's two copies are on the same chromosome or on opposite chromosomes. The laboratory identified the variant [id number] in Aneyah's screening, meaning there is increased risk for Zaryia's two copies of the SMN1 gene to be on the same chromosome. Specifically, given Tassie's ethnic background, her risk to be a silent carrier is approximately 1 in 57. Genetic counseling reviewed with Iyania that if her two copies are on the same chromosome that means her other chromosome would have zero copies, and there could be risk for an affected pregnancy if her reproductive partner is found to be a carrier for SMA. Individuals with SMA have progressive, proximal, and symmetrical muscle weakness and atrophy due to degeneration of motor neurons of the spine and brainstem. Symptoms can first appear at any time between before birth to adulthood. There are five types of SMA that are historically classified based on clinical presentation, onset of symptoms, and the maximum skill level of motor milestones achieved, however, there is a lot of overlap between types. There are several treatments available for individuals affected with SMA and  studies show that treatment is most effective when it is started in the first few  months of life. Given Antrice's carrier screening result, genetic counseling recommended screening Darius for SMA. Darius verbalized understanding of the information discussed and accepted this screening. He completed a Natera saliva kit in our office today.    Patient Plan:  Proceed with: SMA carrier screening for Darius  Informed consent was obtained. All questions were answered.    Thank you for sharing in the care of Keyry with [place].  Please do not hesitate to contact us if you have any questions.  Staci Righter, MS, [facility]

## 2021-03-01 NOTE — Progress Notes (Signed)
ROB c/o pain 7/10 x 2 days, HA 8/10 x 4 days.

## 2021-03-01 NOTE — Progress Notes (Signed)
LOW-RISK PREGNANCY OFFICE VISIT  Patient name: Lisa Brown MRN [id number]  Date of birth: Aug 05, 1990 Chief Complaint:   Routine Prenatal Visit  Subjective:   Lisa Brown is a 31 y.o. [phone] female at [redacted]w[redacted]d with an Estimated Date of Delivery: 08/09/21 being seen today for ongoing management of a low-risk pregnancy aeb has BMI 36.0-36.9,adult; Marijuana use; Supervision of other normal pregnancy, antepartum; and ASCUS of cervix with negative high risk HPV on their problem list.  Patient presents today with headache and cramping .  She reports she has had a headache since Sunday. She states today the HA is "not as intense" as yesterday, but rates it a 7/10.  She states she had caffeine today and is unsure if this contributed to the improvement. She reports a desire to avoid tylenol and other pharmaceuticals for management. She denies a history of HA.  In regards to cramping, patient reports it has been intermittent for the past 2 days.  She reports it is in the lower abdomen localized to the middle.  She states it lasts "a couple minutes" but can be "a lot."  She reports drinking about (3) 32 ounce cups of water daily.  She denies discharge, vaginal bleeding, or issues with urination.  Patient requests letter for her aesthetician exam stating that she can sit.     Contractions: Irritability. Vag. Bleeding: None.   .  Reviewed past medical,surgical, social, obstetrical and family history as well as problem list, medications and allergies.  Objective   Vitals:   03/01/21 1041  BP: 123/78  Pulse: 88  Weight: 276 lb (125.2 kg)  Body mass index is 38.49 kg/m.  Total Weight Gain:11 lb (4.99 kg)         Physical Examination:   General appearance: Well appearing, and in no distress  Mental status: Alert, oriented to person, place, and time  Skin: Warm & dry  Cardiovascular: Normal heart rate noted  Respiratory: Normal respiratory effort, no distress  Abdomen: Not assessed  Pelvic:  Cervical exam deferred           Extremities: Edema: Trace  Fetal Status: Fetal Heart Rate (bpm): 156      No results found for this or any previous visit (from the past 24 hour(s)).  Assessment & Plan:  Low-risk pregnancy of a 31 y.o., [id number] at [redacted]w[redacted]d with an Estimated Date of Delivery: 08/09/21   1. Supervision of other normal pregnancy, antepartum -Anticipatory guidance for upcoming appts. -Patient to schedule next appt in 4-5 weeks for an in-person visit. -AFP collected today. -Letter given for seating during licensure exam.  2. [redacted] weeks gestation of pregnancy -Addressed concerns. -Discussed rest and relaxation for cramping.  Reviewed limited usage of heating pad on abdomen for </=5 minutes or back for relief prn. -Reviewed usage of maternity belt/band to assist with support on uterus. -Discussed how uterine fibroids can contribute to pain and discomfort. -Cautioned to report to MAU if symptoms worsen in intensity or duration.   3. Headache in pregnancy, antepartum, second trimester -Discussed usage of Magnesium for HA. -Instructed to start at 400mg  daily and increase to 800mg . -Cautioned about potential for loose stools/diarrhea with usage. -Reviewed limited usage of tylenol for treatment. -Rx for magnesium sent.  -Instructed to contact office if no improvement for neurology consult.   4. Obesity in pregnancy -Patient not taking bASA. -States she would like to avoid usage of medications during pregnancy. -Benefits and EB findings not reviewed.  Meds: No orders of the  defined types were placed in this encounter.  Labs/procedures today:  Lab Orders         AFP, Serum, Open Spina Bifida      Reviewed: Preterm labor symptoms and general obstetric precautions including but not limited to vaginal bleeding, contractions, leaking of fluid and fetal movement were reviewed in detail with the patient.  All questions were answered.  Follow-up: Return in about 4 weeks (around  03/29/2021).  Orders Placed This Encounter  Procedures   AFP, Serum, Open Spina Bifida   Cherre Robins MSN, CNM 03/01/2021

## 2021-03-03 LAB — AFP, SERUM, OPEN SPINA BIFIDA
AFP MoM: 0.71
AFP Value: 28.4 ng/mL
Gest. Age on Collection Date: 17 weeks
Maternal Age At EDD: 32.4 yr
OSBR Risk 1 IN: 10000
Test Results:: NEGATIVE
Weight: 174 [lb_av]

## 2021-03-14 ENCOUNTER — Telehealth: Payer: Self-pay

## 2021-03-14 NOTE — Telephone Encounter (Signed)
Changed patient's nurse visit to 10am due to a gap in the appointment times. Attempted to reach out to patient but I was unable to get in contact with her. Sending MyChart message to patient to notify.

## 2021-03-15 ENCOUNTER — Ambulatory Visit: Payer: Self-pay | Admitting: Genetics

## 2021-03-15 ENCOUNTER — Encounter: Payer: Self-pay | Admitting: *Deleted

## 2021-03-15 ENCOUNTER — Telehealth: Payer: Self-pay | Admitting: Genetics

## 2021-03-15 ENCOUNTER — Other Ambulatory Visit: Payer: Self-pay

## 2021-03-15 ENCOUNTER — Ambulatory Visit: Payer: Medicaid Other | Admitting: *Deleted

## 2021-03-15 ENCOUNTER — Other Ambulatory Visit: Payer: Self-pay | Admitting: Obstetrics & Gynecology

## 2021-03-15 ENCOUNTER — Ambulatory Visit: Payer: Medicaid Other | Attending: Nurse Practitioner

## 2021-03-15 ENCOUNTER — Other Ambulatory Visit: Payer: Self-pay | Admitting: *Deleted

## 2021-03-15 ENCOUNTER — Ambulatory Visit: Payer: Medicaid Other

## 2021-03-15 VITALS — BP 121/67 | HR 98

## 2021-03-15 DIAGNOSIS — Z3A19 19 weeks gestation of pregnancy: Secondary | ICD-10-CM | POA: Diagnosis not present

## 2021-03-15 DIAGNOSIS — Z3480 Encounter for supervision of other normal pregnancy, unspecified trimester: Secondary | ICD-10-CM

## 2021-03-15 DIAGNOSIS — Z0372 Encounter for suspected placental problem ruled out: Secondary | ICD-10-CM

## 2021-03-15 DIAGNOSIS — E668 Other obesity: Secondary | ICD-10-CM

## 2021-03-15 DIAGNOSIS — Z148 Genetic carrier of other disease: Secondary | ICD-10-CM | POA: Insufficient documentation

## 2021-03-15 DIAGNOSIS — O99212 Obesity complicating pregnancy, second trimester: Secondary | ICD-10-CM | POA: Diagnosis not present

## 2021-03-15 DIAGNOSIS — Z3689 Encounter for other specified antenatal screening: Secondary | ICD-10-CM

## 2021-03-15 DIAGNOSIS — Z363 Encounter for antenatal screening for malformations: Secondary | ICD-10-CM | POA: Insufficient documentation

## 2021-03-15 DIAGNOSIS — Z362 Encounter for other antenatal screening follow-up: Secondary | ICD-10-CM

## 2021-03-15 IMAGING — US US MFM OB DETAIL+14 WK
1 series · 13 of 28 positions shown · non-contrast
Comparison: none

[Series 1: us mfm ob detail+14 wk · 89 acquisitions, 13 frames shown]
[im 4/89]
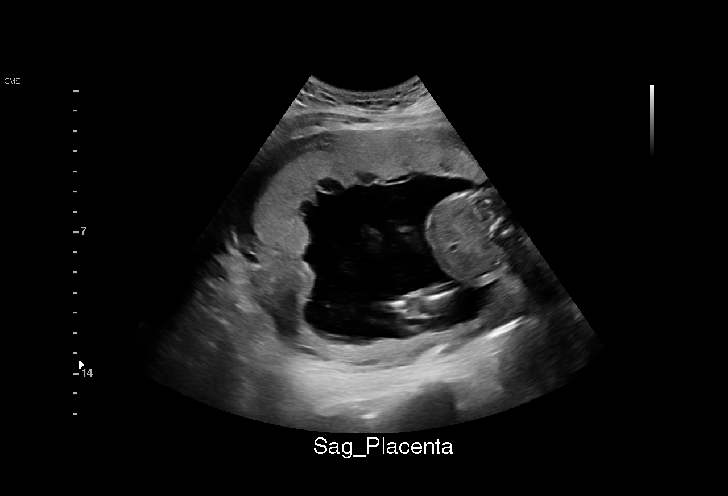
[im 10/89]
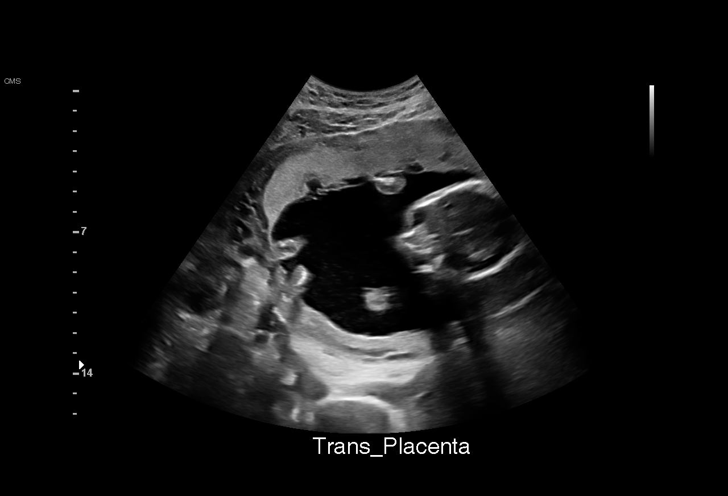
[im 17/89]
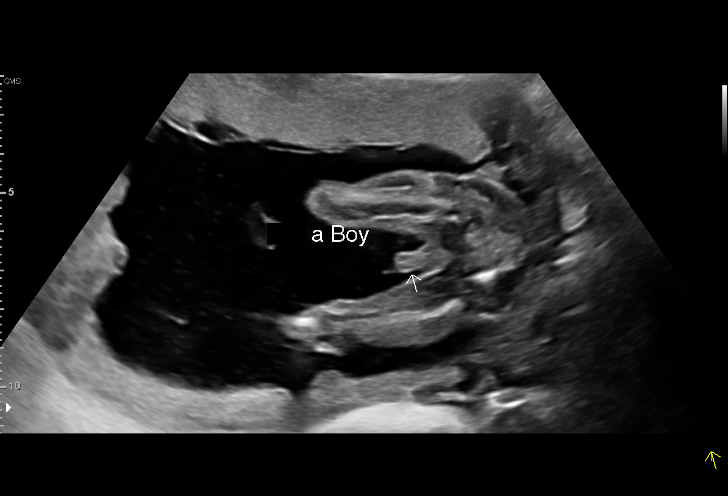
[im 23/89]
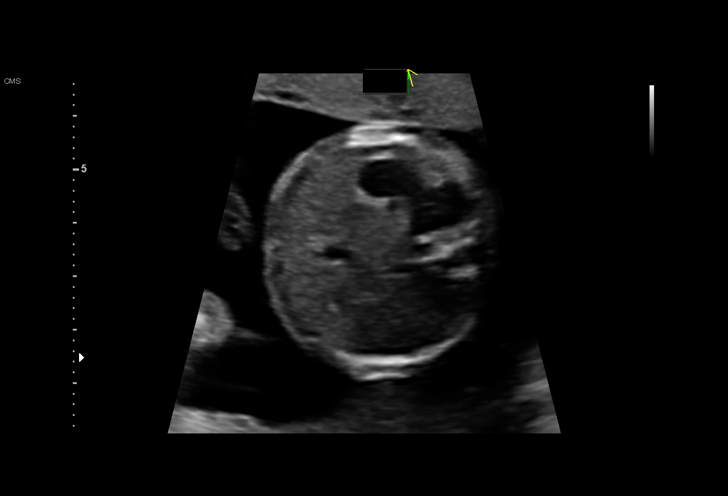
[im 30/89]
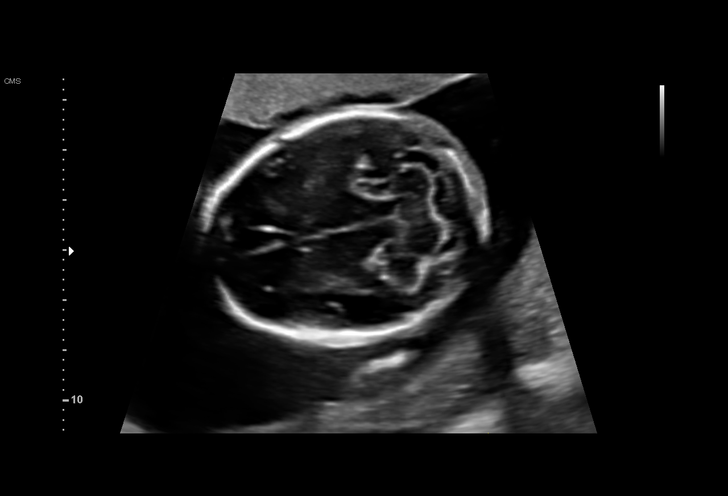
[im 36/89]
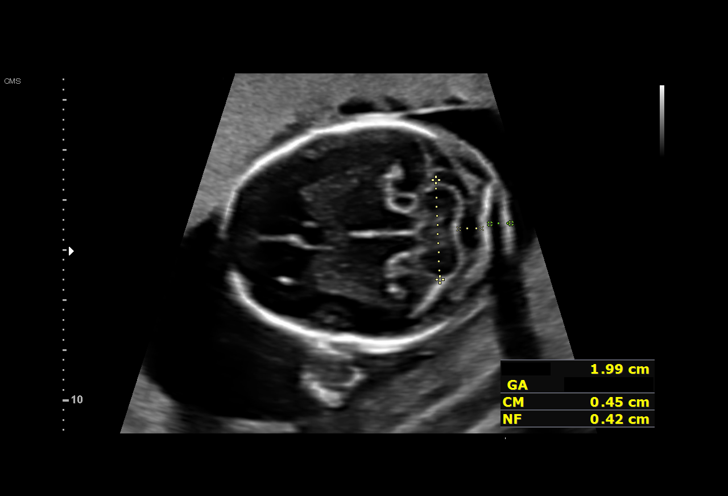
[im 46/89]
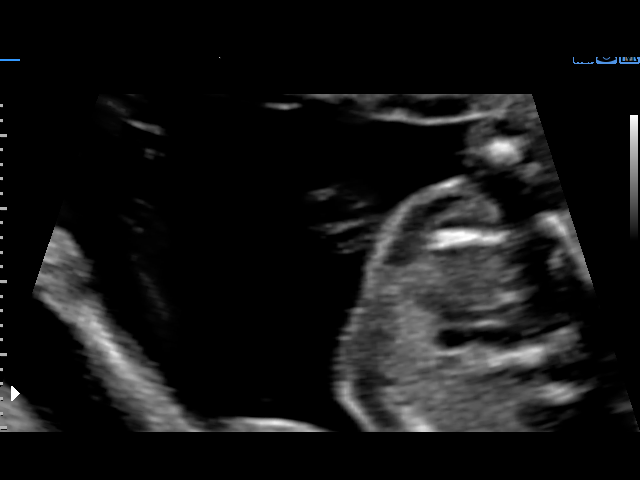
[im 53/89]
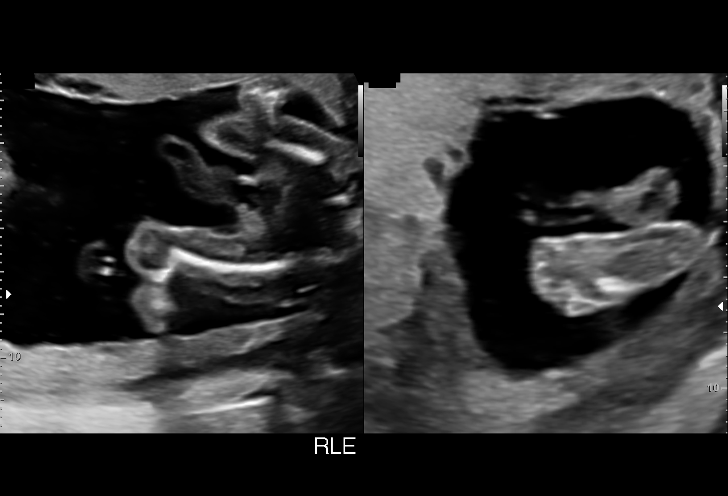
[im 59/89]
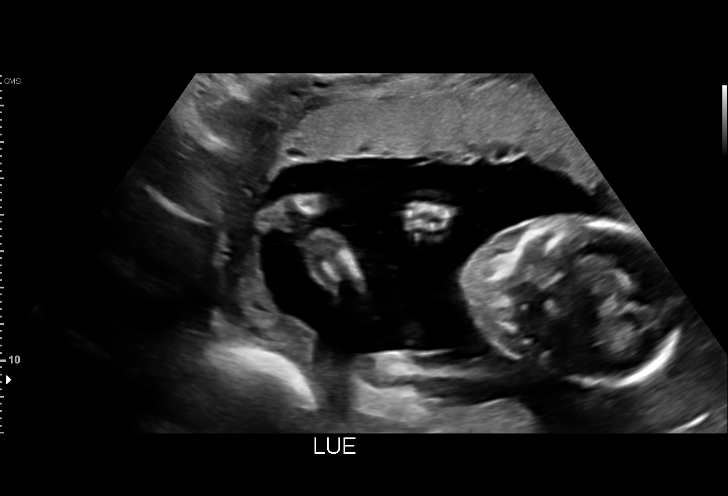
[im 66/89]
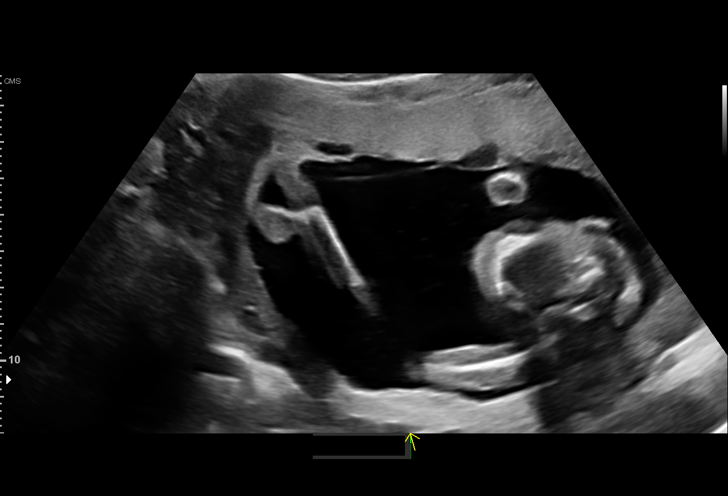
[im 72/89]
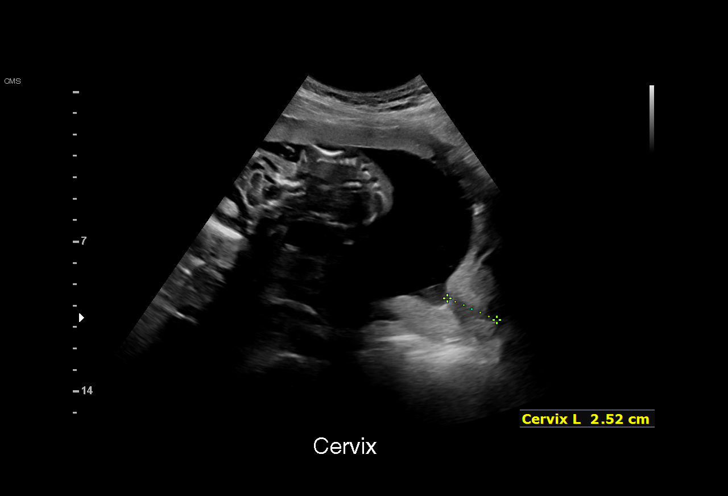
[im 79/89]
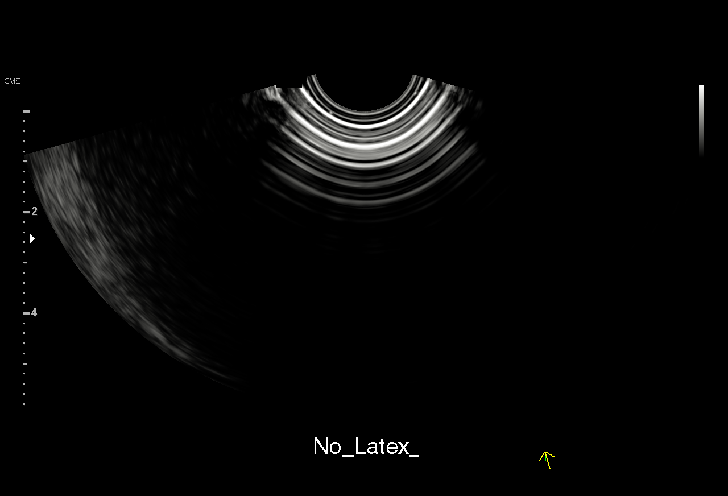
[im 85/89]
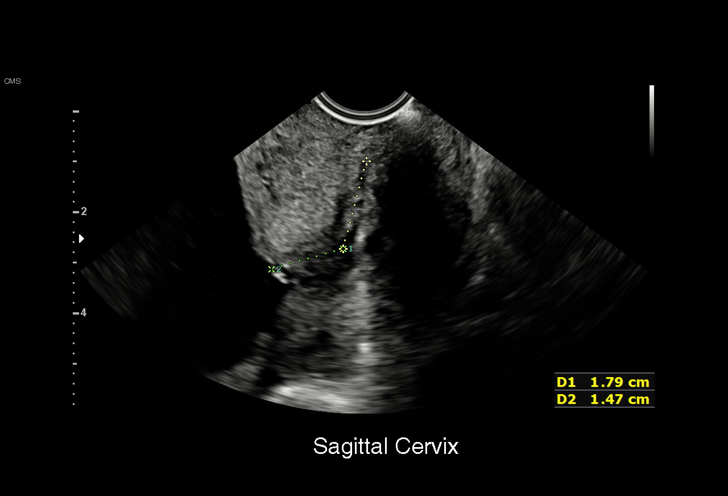

[13 of 28 positions shown; findings below may reference images not displayed]

Indications

 19 weeks gestation of pregnancy
 Antenatal screening for malformations          [LP]
 Obesity complicating pregnancy, second         [LP]
 trimester (pregravid BMI 36)
 Low risk NIPS/neg AFP
 Genetic carrier (increased risk for SMA        [LP]
 carrier)
Fetal Evaluation

 Num Of Fetuses:         1
 Fetal Heart Rate(bpm):  144
 Cardiac Activity:       Observed
 Presentation:           Variable
 Placenta:               Anterior Fundal
 P. Cord Insertion:      Visualized

 Amniotic Fluid
 AFI FV:      Within normal limits

                             Largest Pocket(cm)

Biometry
 BPD:      43.2  mm     G. Age:  19w 0d         54  %    CI:        71.04   %    70 - 86
                                                         FL/HC:      17.3   %    16.1 -
 HC:      163.3  mm     G. Age:  19w 1d         47  %    HC/AC:      1.13        1.09 -
 AC:      144.8  mm     G. Age:  19w 6d         72  %    FL/BPD:     65.5   %
 FL:       28.3  mm     G. Age:  18w 5d         31  %    FL/AC:      19.5   %    20 - 24
 CER:      19.9  mm     G. Age:  19w 2d         47  %
 NFT:       4.2  mm
 LV:        7.2  mm
 CM:        4.5  mm
 Est. FW:     282  gm    0 lb 10 oz      61  %
OB History

 Gravidity:    5         Term:   1        Prem:   0        SAB:   1
 TOP:          2       Ectopic:  0        Living: 1
Gestational Age

 LMP:           19w 0d        Date:  [DATE]                 EDD:   [DATE]
 U/S Today:     19w 1d                                        EDD:   [DATE]
 Best:          19w 0d     Det. By:  LMP  ([DATE])          EDD:   [DATE]
Anatomy

 Cranium:               Appears normal         Aortic Arch:            Appears normal
 Cavum:                 Appears normal         Ductal Arch:            Not well visualized
 Ventricles:            Appears normal         Diaphragm:              Appears normal
 Choroid Plexus:        Appears normal         Stomach:                Appears normal, left
                                                                       sided
 Cerebellum:            Appears normal         Abdomen:                Appears normal
 Posterior Fossa:       Appears normal         Abdominal Wall:         Appears nml (cord
                                                                       insert, abd wall)
 Nuchal Fold:           Appears normal         Cord Vessels:           Appears normal (3
                                                                       vessel cord)
 Face:                  Appears normal         Kidneys:                Appear normal
                        (orbits and profile)
 Lips:                  Appears normal         Bladder:                Appears normal
 Thoracic:              Appears normal         Spine:                  Appears normal
 Heart:                 Appears normal         Upper Extremities:      Appears normal
                        (4CH, axis, and
                        situs)
 RVOT:                  Not well visualized    Lower Extremities:      Appears normal
 LVOT:                  Appears normal

 Other:  3VV, nasal bone, maxilla, mandible, heels/feet and open hands/5th
         digits visualized. Fetus appears to be a male.
Cervix Uterus Adnexa

 Cervix
 Length:            3.8  cm.
 Normal appearance by transvaginal scan

 Uterus
 No abnormality visualized.
 Right Ovary
 Within normal limits.

 Left Ovary
 Not visualized.

 Cul De Sac
 No free fluid seen.

 Adnexa
 No abnormality visualized.
Impression

 G5 P1.  Patient is here for fetal anatomy scan.
 On cell-free fetal DNA screening, the risks of fetal
 aneuploidies are not increased .MSAFP screening showed
 low risk for open-neural tube defects .
 Obstetrical history significant for a term vaginal delivery.

 We performed fetal anatomy scan. No makers of
 aneuploidies or fetal structural defects are seen. Fetal
 biometry is consistent with her previously-established dates.
 Amniotic fluid is normal and good fetal activity is seen.
 Patient understands the limitations of ultrasound in detecting
 fetal anomalies.
 Because of the appearance of cervical shortening, we
 performed transvaginal ultrasound to evaluate the cervix.
 The cervix measures 3.8 cm, which is within normal limits.
 No shortening or funneling was seen on transfundal pressure.

 Patient has an increased carrier risk for spinal muscular
 atrophy and had genetic counseling.  Partner had screened
 for SMA and the results are pending.
Recommendations

 -An appointment was made for her to return in 4 weeks for
 completion of fetal anatomy.
                 BABICA

## 2021-03-15 NOTE — Telephone Encounter (Signed)
Called Lisa Brown to update her on the status of Lisa Brown' carrier screening results. Could not leave a voicemail because her mailbox is full.

## 2021-03-29 ENCOUNTER — Other Ambulatory Visit: Payer: Self-pay

## 2021-03-29 ENCOUNTER — Encounter: Payer: Self-pay | Admitting: Family Medicine

## 2021-03-29 ENCOUNTER — Ambulatory Visit (INDEPENDENT_AMBULATORY_CARE_PROVIDER_SITE_OTHER): Payer: Medicaid Other | Admitting: Family Medicine

## 2021-03-29 VITALS — BP 111/73 | HR 111

## 2021-03-29 DIAGNOSIS — Z3A21 21 weeks gestation of pregnancy: Secondary | ICD-10-CM

## 2021-03-29 DIAGNOSIS — Z3480 Encounter for supervision of other normal pregnancy, unspecified trimester: Secondary | ICD-10-CM

## 2021-03-29 DIAGNOSIS — Z6836 Body mass index (BMI) 36.0-36.9, adult: Secondary | ICD-10-CM

## 2021-03-29 NOTE — Progress Notes (Addendum)
? ? ?  Subjective:  ?Lisa Brown is a 31 y.o. [id number] at [redacted]w[redacted]d being seen today for ongoing prenatal care.  She is currently monitored for the following issues for this low-risk pregnancy and has BMI 36.0-36.9,adult; Marijuana use; Supervision of other normal pregnancy, antepartum; and ASCUS of cervix with negative high risk HPV on their problem list. ? ?Patient reports no complaints.  Contractions: Not present. Vag. Bleeding: None.  Movement: Present. Denies leaking of fluid.  ? ?The following portions of the patient's history were reviewed and updated as appropriate: allergies, current medications, past family history, past medical history, past social history, past surgical history and problem list.  ? ?Objective:  ? ?Vitals:  ? 03/29/21 1339  ?BP: 111/73  ?Pulse: (!) 111  ? ? ?Fetal Status: Fetal Heart Rate (bpm): 156 Fundal Height: 21 cm Movement: Present    ? ?General:  Alert, oriented and cooperative. Patient is in no acute distress.  ?Skin: Skin is warm and dry. No rash noted.   ?Cardiovascular: Normal heart rate noted  ?Respiratory: Normal respiratory effort, no problems with respiration noted  ?Abdomen: Soft, gravid, appropriate for gestational age. Pain/Pressure: Absent     ?Pelvic:  Cervical exam deferred        ?Extremities: Normal range of motion.  Edema: None  ?Mental Status: Normal mood and affect. Normal behavior. Normal judgment and thought content.  ? ? ?Assessment and Plan:  ?Pregnancy: [id number] at [redacted]w[redacted]d ? ?1. Supervision of other normal pregnancy, antepartum ?Doing well with normal fetal movement. Desires waterbirth and had previous one with first delivery. Plans to attend waterbirth class in 04/2021.  ? ?2. [redacted] weeks gestation of pregnancy ?Follow up US scheduled for 4/3 to complete anatomy.  ? ?3. BMI 36.0-36.9,adult ? ? ?Preterm labor symptoms and general obstetric precautions including but not limited to vaginal bleeding, contractions, leaking of fluid and fetal movement were reviewed in detail  with the patient. ?Please refer to After Visit Summary for other counseling recommendations.  ?Return in about 4 weeks (around 04/26/2021) for LROB. ? ? ?Allayne Stack, DO ?

## 2021-03-29 NOTE — Progress Notes (Signed)
Patient presents for ROB. ?Drinking caffeine and taking caffeine supplements to help with migraines & headaches as needed.  ? ?

## 2021-04-03 ENCOUNTER — Other Ambulatory Visit: Payer: Self-pay

## 2021-04-03 ENCOUNTER — Encounter (HOSPITAL_COMMUNITY): Payer: Self-pay | Admitting: Obstetrics and Gynecology

## 2021-04-03 ENCOUNTER — Inpatient Hospital Stay (HOSPITAL_COMMUNITY)
Admission: AD | Admit: 2021-04-03 | Discharge: 2021-04-04 | Disposition: A | Discharge status: Home / Self Care | Payer: Medicaid Other | Attending: Obstetrics and Gynecology | Admitting: Obstetrics and Gynecology

## 2021-04-03 ENCOUNTER — Inpatient Hospital Stay (HOSPITAL_COMMUNITY): Payer: Medicaid Other

## 2021-04-03 ENCOUNTER — Telehealth: Payer: Self-pay | Admitting: *Deleted

## 2021-04-03 DIAGNOSIS — O26892 Other specified pregnancy related conditions, second trimester: Secondary | ICD-10-CM | POA: Diagnosis present

## 2021-04-03 DIAGNOSIS — Z3480 Encounter for supervision of other normal pregnancy, unspecified trimester: Secondary | ICD-10-CM

## 2021-04-03 DIAGNOSIS — G43919 Migraine, unspecified, intractable, without status migrainosus: Secondary | ICD-10-CM | POA: Diagnosis not present

## 2021-04-03 DIAGNOSIS — R42 Dizziness and giddiness: Secondary | ICD-10-CM | POA: Insufficient documentation

## 2021-04-03 DIAGNOSIS — Z20822 Contact with and (suspected) exposure to covid-19: Secondary | ICD-10-CM | POA: Diagnosis not present

## 2021-04-03 DIAGNOSIS — R519 Headache, unspecified: Secondary | ICD-10-CM | POA: Diagnosis not present

## 2021-04-03 DIAGNOSIS — M542 Cervicalgia: Secondary | ICD-10-CM | POA: Insufficient documentation

## 2021-04-03 DIAGNOSIS — O99352 Diseases of the nervous system complicating pregnancy, second trimester: Secondary | ICD-10-CM | POA: Insufficient documentation

## 2021-04-03 DIAGNOSIS — G8929 Other chronic pain: Secondary | ICD-10-CM | POA: Diagnosis not present

## 2021-04-03 DIAGNOSIS — R29898 Other symptoms and signs involving the musculoskeletal system: Secondary | ICD-10-CM

## 2021-04-03 DIAGNOSIS — Z3A21 21 weeks gestation of pregnancy: Secondary | ICD-10-CM | POA: Insufficient documentation

## 2021-04-03 DIAGNOSIS — G44209 Tension-type headache, unspecified, not intractable: Secondary | ICD-10-CM | POA: Diagnosis not present

## 2021-04-03 DIAGNOSIS — R0602 Shortness of breath: Secondary | ICD-10-CM | POA: Diagnosis not present

## 2021-04-03 DIAGNOSIS — Z3A22 22 weeks gestation of pregnancy: Secondary | ICD-10-CM | POA: Diagnosis not present

## 2021-04-03 LAB — COMPREHENSIVE METABOLIC PANEL
ALT: 26 U/L (ref 0–44)
AST: 22 U/L (ref 15–41)
Albumin: 3.1 g/dL — ABNORMAL LOW (ref 3.5–5.0)
Alkaline Phosphatase: 43 U/L (ref 38–126)
Anion gap: 7 (ref 5–15)
BUN: <5 mg/dL — ABNORMAL LOW (ref 6–20)
CO2: 23 mmol/L (ref 22–32)
Calcium: 9 mg/dL (ref 8.9–10.3)
Chloride: 105 mmol/L (ref 98–111)
Creatinine, Ser: 0.66 mg/dL (ref 0.44–1.00)
GFR, Estimated: >60 mL/min (ref >60)
Glucose, Bld: 106 mg/dL — ABNORMAL HIGH (ref 70–99)
Potassium: 3.5 mmol/L (ref 3.5–5.1)
Sodium: 135 mmol/L (ref 135–145)
Total Bilirubin: 0.3 mg/dL (ref 0.3–1.2)
Total Protein: 6.7 g/dL (ref 6.5–8.1)

## 2021-04-03 LAB — URINALYSIS, ROUTINE W REFLEX MICROSCOPIC
Bilirubin Urine: NEGATIVE
Glucose, UA: NEGATIVE mg/dL
Hgb urine dipstick: NEGATIVE
Ketones, ur: NEGATIVE mg/dL
Leukocytes,Ua: NEGATIVE
Nitrite: NEGATIVE
Protein, ur: NEGATIVE mg/dL
Specific Gravity, Urine: 1.016 (ref 1.005–1.030)
pH: 7 (ref 5.0–8.0)

## 2021-04-03 LAB — CBC
HCT: 34.3 % — ABNORMAL LOW (ref 36.0–46.0)
Hemoglobin: 11.5 g/dL — ABNORMAL LOW (ref 12.0–15.0)
MCH: 28.3 pg (ref 26.0–34.0)
MCHC: 33.5 g/dL (ref 30.0–36.0)
MCV: 84.3 fL (ref 80.0–100.0)
Platelets: 268 10*3/uL (ref 150–400)
RBC: 4.07 MIL/uL (ref 3.87–5.11)
RDW: 13.6 % (ref 11.5–15.5)
WBC: 8.9 10*3/uL (ref 4.0–10.5)
nRBC: 0 % (ref 0.0–0.2)

## 2021-04-03 LAB — RESP PANEL BY RT-PCR (FLU A&B, COVID) ARPGX2
Influenza A by PCR: NEGATIVE
Influenza B by PCR: NEGATIVE
SARS Coronavirus 2 by RT PCR: NEGATIVE

## 2021-04-03 IMAGING — MR MR HEAD W/O CM
14 of 15 series · 44 of 48 positions shown · non-contrast
Comparison: None.
COMPARISON: None.

Addendum:
CLINICAL DATA: Headache, neck pain, right-sided numbness and
tingling.

EXAM:
MRI HEAD WITHOUT CONTRAST
TECHNIQUE: Multiplanar, multiecho pulse sequences of the brain and surrounding
structures were obtained without intravenous contrast.

[Series 5: DWI · axial · 3.0mm · 0.92mm/px · z∈[-161,-19]mm · 5 of 102 slices shown (1 of 6)]
[im 1/102]
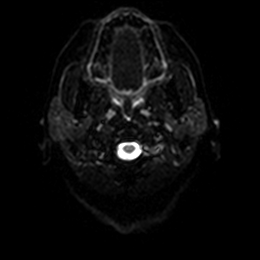
[im 26/102]
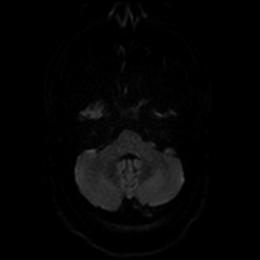
[im 51/102]
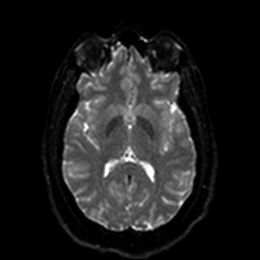
[im 76/102]
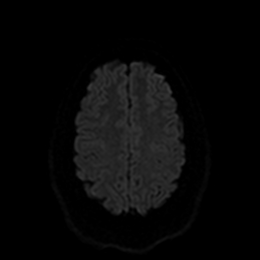
[im 102/102]
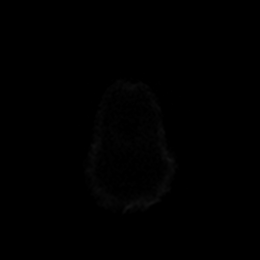

[Series 6: DWI · axial · 3.0mm · 0.92mm/px · z∈[-161,-19]mm · 2 of 51 slices shown (2 of 6)]
[im 1/51]
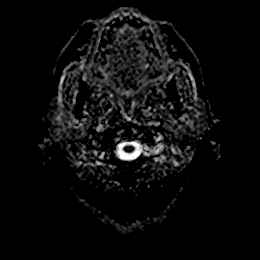
[im 51/51]
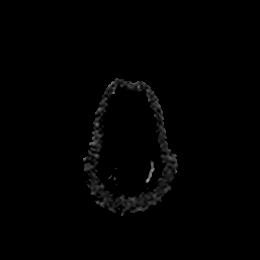

[Series 7: DWI · coronal · 4.0mm · 0.88mm/px · 5 of 76 slices shown (3 of 6)]
[im 1/76]
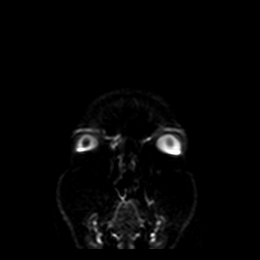
[im 19/76]
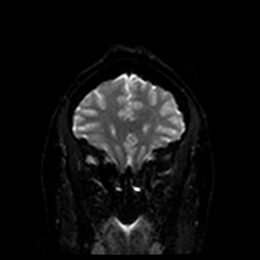
[im 38/76]
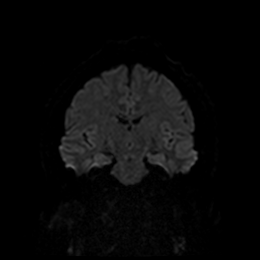
[im 57/76]
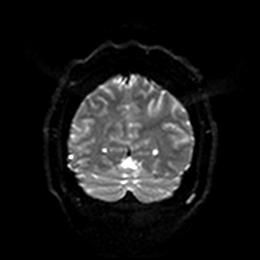
[im 76/76]
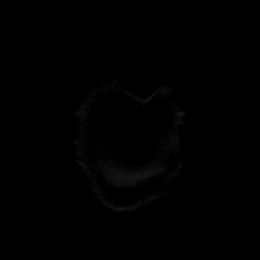

[Series 8: DWI · coronal · 4.0mm · 0.88mm/px · 2 of 38 slices shown (4 of 6)]
[im 1/38]
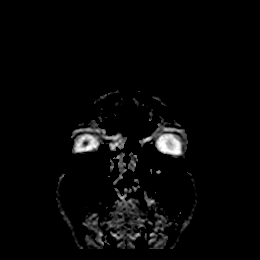
[im 38/38]
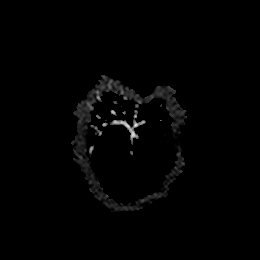

[Series 9: T1 · sagittal · 5.0mm · 0.75mm/px · 2 of 25 slices shown]
[im 1/25]
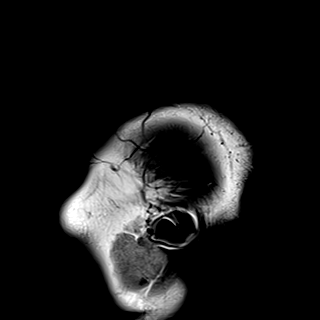
[im 25/25]
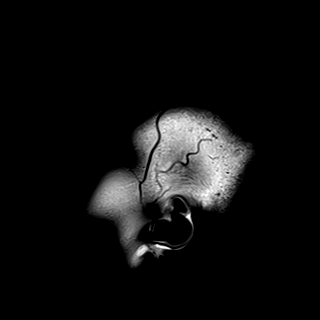

[Series 10: DWI · coronal · 4.0mm · 0.88mm/px · 5 of 76 slices shown (5 of 6)]
[im 1/76]
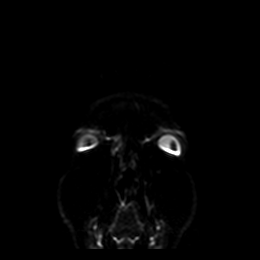
[im 19/76]
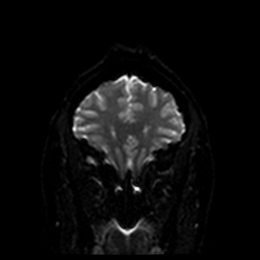
[im 38/76]
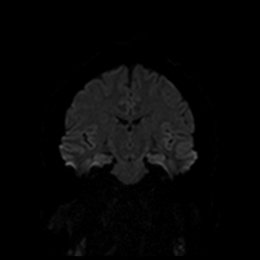
[im 57/76]
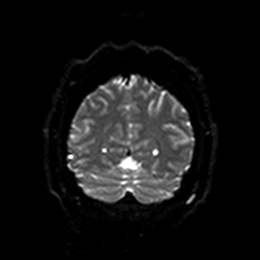
[im 76/76]
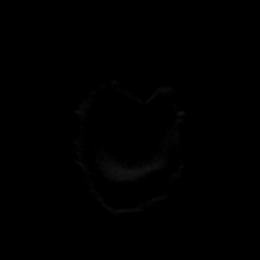

[Series 11: DWI · coronal · 4.0mm · 0.88mm/px · 2 of 38 slices shown (6 of 6)]
[im 1/38]
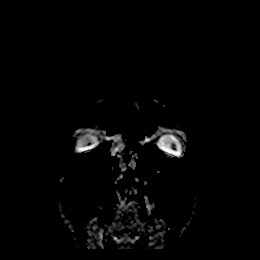
[im 38/38]
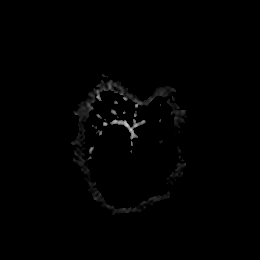

[Series 12: T2 · axial · 5.0mm · 0.75mm/px · z∈[-164,-18]mm · 2 of 27 slices shown (1 of 2)]
[im 1/27]
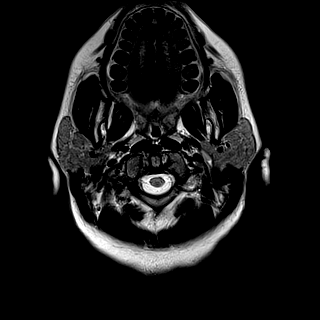
[im 27/27]
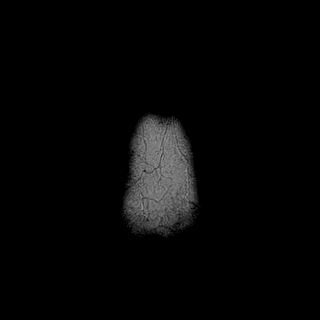

[Series 13: FLAIR · axial · 5.0mm · 0.45mm/px · z∈[-162,-15]mm · 2 of 27 slices shown]
[im 1/27]
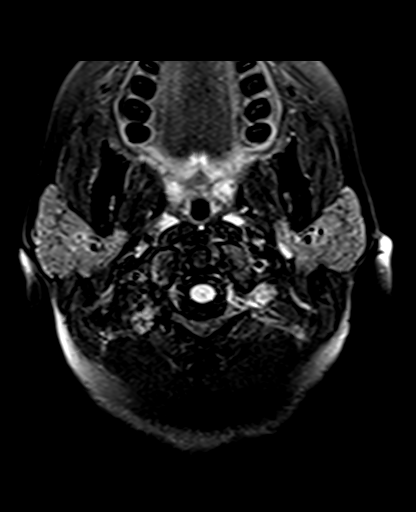
[im 27/27]
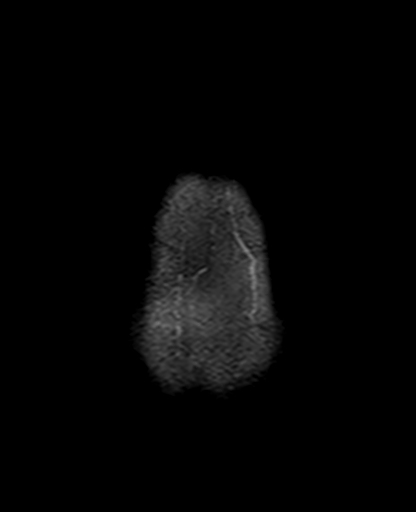

[Series 14: mag_images · axial · 3.0mm · 0.94mm/px · z∈[-173,-7]mm · 4 of 60 slices shown]
[im 1/60]
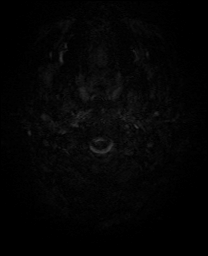
[im 20/60]
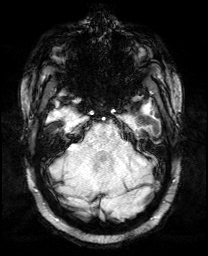
[im 40/60]
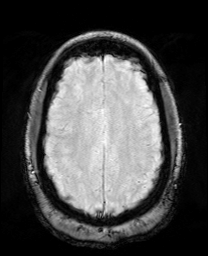
[im 60/60]
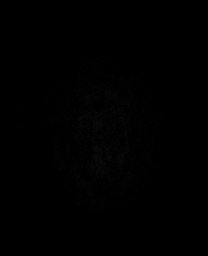

[Series 15: pha_images · axial · 3.0mm · 0.94mm/px · z∈[-173,-7]mm · 4 of 60 slices shown]
[im 1/60]
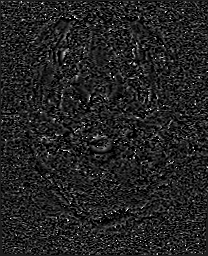
[im 20/60]
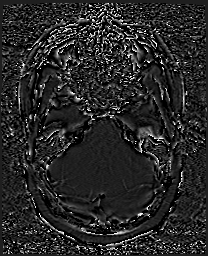
[im 40/60]
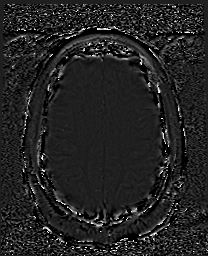
[im 60/60]
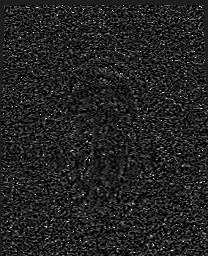

[Series 16: swi_images · axial · 3.0mm · 0.94mm/px · z∈[-173,-7]mm · 4 of 60 slices shown]
[im 1/60]
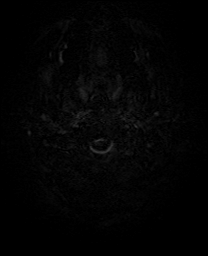
[im 20/60]
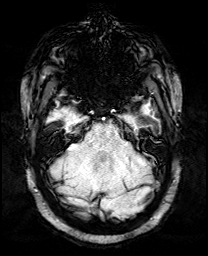
[im 40/60]
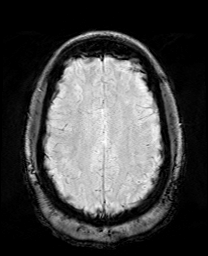
[im 60/60]
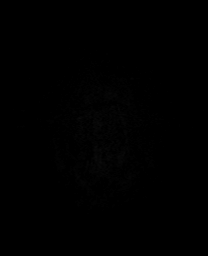

[Series 17: mip_images(sw) · axial · 24.0mm · 0.94mm/px · z∈[-164,-17]mm · 3 of 53 slices shown]
[im 1/53]
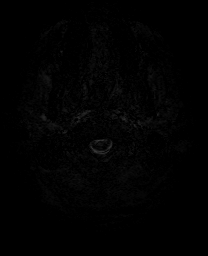
[im 27/53]
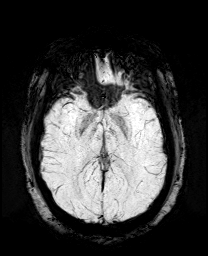
[im 53/53]
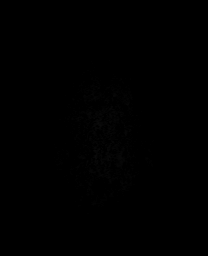

[Series 19: T2 · coronal · 5.0mm · 0.34mm/px · 2 of 32 slices shown (2 of 2)]
[im 1/32]
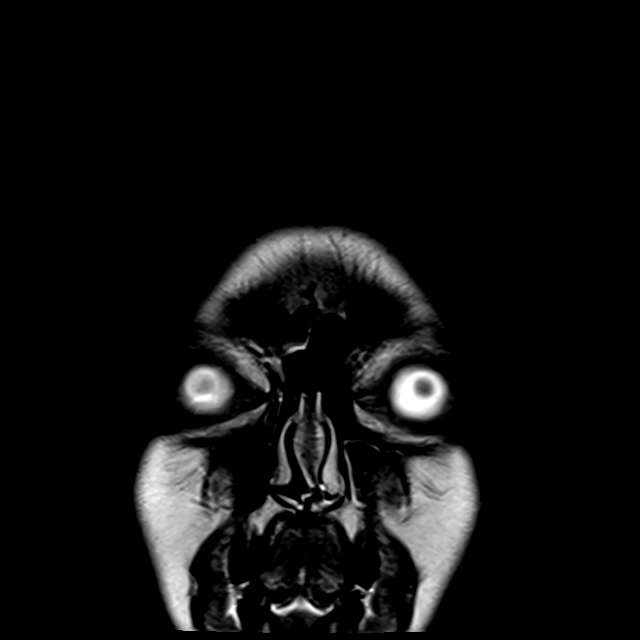
[im 32/32]
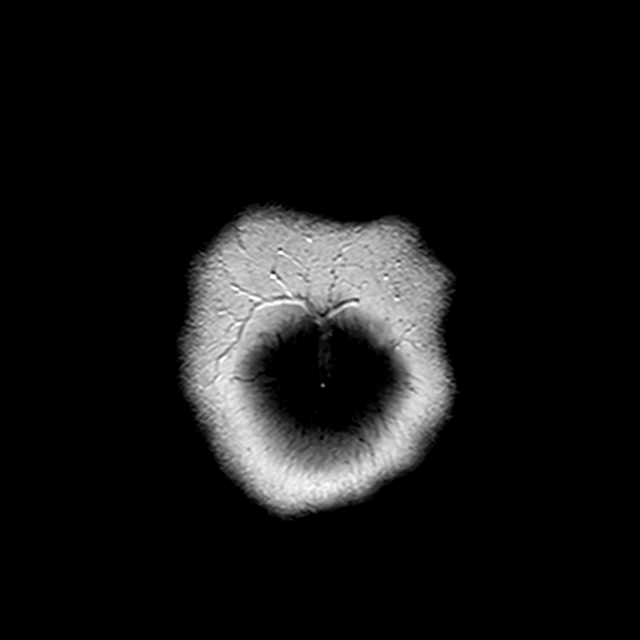

[44 of 48 positions shown; findings below may reference images not displayed]

FINDINGS: Brain: Restricted diffusion to suggest acute or subacute infarct. No
acute hemorrhage, mass, mass effect, or midline shift. No
hydrocephalus or extra-axial collection. No hemosiderin deposition
to suggest remote hemorrhage. No hydrocephalus or extra-axial
collection.

Vascular: Normal flow voids.

Skull and upper cervical spine: Normal marrow signal.

Sinuses/Orbits: Mucosal thickening in the posterior left ethmoid air
cells, left sphenoid sinus, and right frontal sinus. Otherwise
unremarkable.

Other: The mastoids are well aerated.
IMPRESSION: No acute intracranial process. No etiology is seen for the patient's
headache, neck pain, or right-sided symptoms.

ADDENDUM:
Correction: NO restricted diffusion to suggest acute or subacute
infarct.

*** End of Addendum ***
FINDINGS: Brain: Restricted diffusion to suggest acute or subacute infarct. No
acute hemorrhage, mass, mass effect, or midline shift. No
hydrocephalus or extra-axial collection. No hemosiderin deposition
to suggest remote hemorrhage. No hydrocephalus or extra-axial
collection.

Vascular: Normal flow voids.

Skull and upper cervical spine: Normal marrow signal.

Sinuses/Orbits: Mucosal thickening in the posterior left ethmoid air
cells, left sphenoid sinus, and right frontal sinus. Otherwise
unremarkable.

Other: The mastoids are well aerated.
IMPRESSION: No acute intracranial process. No etiology is seen for the patient's
headache, neck pain, or right-sided symptoms.

## 2021-04-03 MED ORDER — OXYCODONE HCL 5 MG PO TABS
5.0000 mg | ORAL_TABLET | Freq: Once | ORAL | Status: AC
  Administered 2021-04-03: 5 mg via ORAL
  Filled 2021-04-03: qty 1

## 2021-04-03 MED ORDER — CYCLOBENZAPRINE HCL 5 MG PO TABS: 5.0000 mg | ORAL_TABLET | Freq: Three times a day (TID) | ORAL | 0 refills | Status: DC | PRN

## 2021-04-03 MED ORDER — ACETAMINOPHEN 325 MG PO TABS: 650.0000 mg | ORAL_TABLET | Freq: Four times a day (QID) | ORAL | 0 refills | Status: DC | PRN

## 2021-04-03 MED ORDER — OXYCODONE HCL 5 MG PO TABS: 5.0000 mg | ORAL_TABLET | Freq: Four times a day (QID) | ORAL | 0 refills | Status: DC | PRN

## 2021-04-03 MED ORDER — METOCLOPRAMIDE HCL 5 MG/ML IJ SOLN: 10.0000 mg | Freq: Once | INTRAMUSCULAR | Status: DC

## 2021-04-03 MED ORDER — METOCLOPRAMIDE HCL 10 MG PO TABS
10.0000 mg | ORAL_TABLET | Freq: Once | ORAL | Status: AC
  Administered 2021-04-03: 10 mg via ORAL
  Filled 2021-04-03: qty 1

## 2021-04-03 MED ORDER — ACETAMINOPHEN 500 MG PO TABS
1000.0000 mg | ORAL_TABLET | Freq: Once | ORAL | Status: AC
  Administered 2021-04-03: 1000 mg via ORAL
  Filled 2021-04-03: qty 2

## 2021-04-03 MED ORDER — DEXAMETHASONE SODIUM PHOSPHATE 10 MG/ML IJ SOLN
10.0000 mg | Freq: Once | INTRAMUSCULAR | Status: AC
  Administered 2021-04-03: 10 mg via INTRAMUSCULAR
  Filled 2021-04-03: qty 1

## 2021-04-03 MED ORDER — CYCLOBENZAPRINE HCL 5 MG PO TABS
5.0000 mg | ORAL_TABLET | Freq: Once | ORAL | Status: AC
  Administered 2021-04-03: 5 mg via ORAL
  Filled 2021-04-03: qty 1

## 2021-04-03 NOTE — Telephone Encounter (Signed)
Pt called to office stating she is having a severe HA today with some neck pain.  ?Pt states she has been resting all day, taken Tylenol and used essential oils to help with HA with no relief.  Pt states BP today is 111/70, no other symptoms.  ?Pt is tearful over phone in discomfort from HA.  Pt states she also feels that she cannot work with this HA.  ? ?Pt advised to be evaluated at the hospital for eval severe HA. ? ?Pt states understanding.  ?

## 2021-04-03 NOTE — MAU Provider Note (Addendum)
?History  ?  ? ?CSN: [id number] ? ?Arrival date and time: 04/03/21 1713 ? ? None  ? ?Chief Complaint  ?Patient presents with  ? Headache  ? Dizziness  ? ?HPI ?31 yo F [id number] on [redacted]w[redacted]d who presents with headache and dizziness and neck pain ? ?#Right sided HA ?#Dizziness ?Started yesterday ?Got worse this morning ?10/10 when she came into MAU ?Took tylenol this morning at 10 AM- which did not help ?Worse with movement ?Only right side ?Denies eye pain, no blurry vision ?Also with neck pain also on right side ?Initially denies and extremity weakness ? ?OB History   ? ? Gravida  ?5  ? Para  ?1  ? Term  ?1  ? Preterm  ?0  ? AB  ?3  ? Living  ?1  ?  ? ? SAB  ?1  ? IAB  ?2  ? Ectopic  ?0  ? Multiple  ?0  ? Live Births  ?1  ?   ?  ?  ? ? ?Past Medical History:  ?Diagnosis Date  ? Asthma   ? ? ?Past Surgical History:  ?Procedure Laterality Date  ? NO PAST SURGERIES    ? ? ?Family History  ?Problem Relation Age of Onset  ? Diabetes Mother   ? Diabetes Father   ? Kidney disease Father   ? Hypertension Father   ? Liver disease Father   ? Cancer Maternal Grandmother   ? Diabetes Maternal Grandmother   ? Heart disease Maternal Grandmother   ? Diabetes Maternal Grandfather   ? ? ?Social History  ? ?Tobacco Use  ? Smoking status: Never  ? Smokeless tobacco: Never  ?Vaping Use  ? Vaping Use: Never used  ?Substance Use Topics  ? Alcohol use: No  ? Drug use: No  ? ? ?Allergies: No Known Allergies ? ?Medications Prior to Admission  ?Medication Sig Dispense Refill Last Dose  ? Prenatal Vit-Fe Fumarate-FA (PREPLUS) 27-1 MG TABS Take 1 tablet by mouth daily. 30 tablet 13 04/02/2021  ? Blood Pressure Monitoring (BLOOD PRESSURE KIT) DEVI 1 Device by Does not apply route once a week. 1 each 0   ? Doxylamine-Pyridoxine (DICLEGIS) 10-10 MG TBEC Take 2 tablets by mouth at bedtime. If symptoms persist, add one tablet in the morning and one in the afternoon (Patient not taking: Reported on 03/15/2021) 100 tablet 5   ? Magnesium Oxide -Mg Supplement  400 MG CAPS Take 400 mg by mouth daily. Increase to twice daily if symptoms persists. (Patient not taking: Reported on 03/15/2021) 60 capsule 1   ? Prenatal Vit-Fe Fumarate-FA (PRENATAL VITAMIN PO) Take 1 tablet by mouth daily. (Patient not taking: Reported on 03/15/2021)     ? terconazole (TERAZOL 7) 0.4 % vaginal cream Place 1 applicator vaginally at bedtime. (Patient not taking: Reported on 03/15/2021) 45 g 0   ? ?Review of Systems  ?Constitutional:  Negative for chills and fever.  ?HENT:  Negative for congestion.   ?Eyes:  Negative for photophobia and visual disturbance.  ?Respiratory:  Negative for chest tightness.   ?Cardiovascular:  Negative for chest pain.  ?Gastrointestinal:  Positive for nausea. Negative for abdominal pain and vomiting.  ?Endocrine: Negative for polyuria.  ?Genitourinary:  Negative for dysuria.  ?Musculoskeletal:  Positive for neck pain. Negative for back pain, myalgias and neck stiffness.  ?Neurological:  Positive for dizziness. Negative for syncope, speech difficulty, weakness, light-headedness and numbness.  ?Hematological:  Negative for adenopathy.  ?Psychiatric/Behavioral:  Negative for agitation.   ?Physical  Exam  ? ?Blood pressure 108/66, pulse 94, temperature 98.2 ?F (36.8 ?C), temperature source Oral, resp. rate 17, height $RemoveBe'5\' 11"'UVtcsFGaG$  (1.803 m), weight 125.5 kg, last menstrual period 11/02/2020, SpO2 100 %, currently breastfeeding. ? ?Physical Exam ?Vitals and nursing note reviewed.  ?Constitutional:   ?   Appearance: She is well-developed.  ?HENT:  ?   Head: Normocephalic.  ?Cardiovascular:  ?   Rate and Rhythm: Normal rate.  ?Pulmonary:  ?   Effort: Pulmonary effort is normal.  ?Abdominal:  ?   Palpations: Abdomen is soft.  ?Musculoskeletal:     ?   General: No tenderness.  ?   Comments: Right neck with decreased ROM. Negative kernig and burdinsky sign.   ?Skin: ?   General: Skin is warm.  ?   Capillary Refill: Capillary refill takes less than 2 seconds.  ?Neurological:  ?   Mental  Status: She is alert.  ?   Cranial Nerves: No cranial nerve deficit or facial asymmetry.  ?   Sensory: Sensory deficit present.  ?   Deep Tendon Reflexes: Reflexes normal.  ?   Comments: CN II-XII intact on exam. Initially no weakness or sensation changes. On reassessment patient reported right upper and lower extremity numbness/ tingling and on exam decreased sensation of right upper extremity and decreased strength of right lower extremity compared to left  ?Psychiatric:     ?   Mood and Affect: Mood normal.  ? ?Results for orders placed or performed during the hospital encounter of 04/03/21 (from the past 24 hour(s))  ?Urinalysis, Routine w reflex microscopic Urine, Clean Catch     Status: None  ? Collection Time: 04/03/21  7:16 PM  ?Result Value Ref Range  ? Color, Urine YELLOW YELLOW  ? APPearance CLEAR CLEAR  ? Specific Gravity, Urine 1.016 1.005 - 1.030  ? pH 7.0 5.0 - 8.0  ? Glucose, UA NEGATIVE NEGATIVE mg/dL  ? Hgb urine dipstick NEGATIVE NEGATIVE  ? Bilirubin Urine NEGATIVE NEGATIVE  ? Ketones, ur NEGATIVE NEGATIVE mg/dL  ? Protein, ur NEGATIVE NEGATIVE mg/dL  ? Nitrite NEGATIVE NEGATIVE  ? Leukocytes,Ua NEGATIVE NEGATIVE  ?CBC     Status: Abnormal  ? Collection Time: 04/03/21  7:41 PM  ?Result Value Ref Range  ? WBC 8.9 4.0 - 10.5 K/uL  ? RBC 4.07 3.87 - 5.11 MIL/uL  ? Hemoglobin 11.5 (L) 12.0 - 15.0 g/dL  ? HCT 34.3 (L) 36.0 - 46.0 %  ? MCV 84.3 80.0 - 100.0 fL  ? MCH 28.3 26.0 - 34.0 pg  ? MCHC 33.5 30.0 - 36.0 g/dL  ? RDW 13.6 11.5 - 15.5 %  ? Platelets 268 150 - 400 K/uL  ? nRBC 0.0 0.0 - 0.2 %  ?Comprehensive metabolic panel     Status: Abnormal  ? Collection Time: 04/03/21  7:41 PM  ?Result Value Ref Range  ? Sodium 135 135 - 145 mmol/L  ? Potassium 3.5 3.5 - 5.1 mmol/L  ? Chloride 105 98 - 111 mmol/L  ? CO2 23 22 - 32 mmol/L  ? Glucose, Bld 106 (H) 70 - 99 mg/dL  ? BUN <5 (L) 6 - 20 mg/dL  ? Creatinine, Ser 0.66 0.44 - 1.00 mg/dL  ? Calcium 9.0 8.9 - 10.3 mg/dL  ? Total Protein 6.7 6.5 - 8.1 g/dL   ? Albumin 3.1 (L) 3.5 - 5.0 g/dL  ? AST 22 15 - 41 U/L  ? ALT 26 0 - 44 U/L  ? Alkaline Phosphatase 43 38 -  126 U/L  ? Total Bilirubin 0.3 0.3 - 1.2 mg/dL  ? GFR, Estimated >60 >60 mL/min  ? Anion gap 7 5 - 15  ?Resp Panel by RT-PCR (Flu A&B, Covid) Nasopharyngeal Swab     Status: None  ? Collection Time: 04/03/21 10:23 PM  ? Specimen: Nasopharyngeal Swab; Nasopharyngeal(NP) swabs in vial transport medium  ?Result Value Ref Range  ? SARS Coronavirus 2 by RT PCR NEGATIVE NEGATIVE  ? Influenza A by PCR NEGATIVE NEGATIVE  ? Influenza B by PCR NEGATIVE NEGATIVE  ? ?MR BRAIN WO CONTRAST ? ?Addendum Date: 04/03/2021   ?ADDENDUM REPORT: 04/03/2021 22:33 ADDENDUM: Correction: NO restricted diffusion to suggest acute or subacute infarct. Electronically Signed   By: Merilyn Baba M.D.   On: 04/03/2021 22:33  ? ?Result Date: 04/03/2021 ?CLINICAL DATA:  Headache, neck pain, right-sided numbness and tingling. EXAM: MRI HEAD WITHOUT CONTRAST TECHNIQUE: Multiplanar, multiecho pulse sequences of the brain and surrounding structures were obtained without intravenous contrast. COMPARISON:  None. FINDINGS: Brain: Restricted diffusion to suggest acute or subacute infarct. No acute hemorrhage, mass, mass effect, or midline shift. No hydrocephalus or extra-axial collection. No hemosiderin deposition to suggest remote hemorrhage. No hydrocephalus or extra-axial collection. Vascular: Normal flow voids. Skull and upper cervical spine: Normal marrow signal. Sinuses/Orbits: Mucosal thickening in the posterior left ethmoid air cells, left sphenoid sinus, and right frontal sinus. Otherwise unremarkable. Other: The mastoids are well aerated. IMPRESSION: No acute intracranial process. No etiology is seen for the patient's headache, neck pain, or right-sided symptoms. Electronically Signed: By: Merilyn Baba M.D. On: 04/03/2021 22:17  ? ?MR Venogram Head ? ?Result Date: 04/04/2021 ?CLINICAL DATA:  31 year old pregnant female with headache, neck  pain, right side numbness and tingling. Unrevealing Head MRI yesterday. EXAM: MR VENOGRAM HEAD WITHOUT CONTRAST TECHNIQUE: Multiplanar, multi-echo pulse sequences of the brain and surrounding structures were

## 2021-04-03 NOTE — MAU Note (Signed)
2055 to MRI per wheelchair ?

## 2021-04-03 NOTE — MAU Note (Signed)
...  Lisa Brown is a 31 y.o. at [redacted]w[redacted]d here in MAU reporting: Bilateral neck pain that started this past Sunday. She states she also has a HA that started yesterday but states she has been dealing with HA's her entire pregnancy but "none like this." She states if she lies down her HA decreases slightly but it becomes severe the moment she moves or gets up. Has been experiencing dizziness with walking for the past two hours. No VB or LOF.  ? ?Last medications: ?Tylenol @ 1000 - 1000 mg ? ?Last PO: 1500 today ? ?Pain score:  ?10/10 HA ?10/10 neck pain ? ?FHT: 143 initial ?Lab orders placed from triage: UA ? ?

## 2021-04-03 NOTE — Discharge Instructions (Addendum)
For prevention of headaches in pregnancy: ?-Magnesium, 400mg  by mouth, once daily ?-Vitamin B2, 400mg  by mouth, once daily ? ?For treatment of migraines in pregnancy: ?-take medication at the first sign of the pain of a headache, or the first sign of your aura ?-start with 1000mg  Tylenol (do not exceed 4000mg  of Tylenol in 24hrs), with or without Reglan 10mg  ?-if no relief after 1-2hours, can take Flexeril 10mg  ? ? ?

## 2021-04-03 NOTE — Progress Notes (Signed)
Dr Ephriam Jenkins in earlier to discuss d/c plan with pt. Written and verbal d/c instructions given and understanding voiced. ?

## 2021-04-04 ENCOUNTER — Inpatient Hospital Stay (HOSPITAL_COMMUNITY): Payer: Medicaid Other

## 2021-04-04 ENCOUNTER — Encounter: Payer: Self-pay | Admitting: Neurology

## 2021-04-04 IMAGING — MR MR [PERSON_NAME] HEAD
5 of 6 series · 43 of 48 positions shown · non-contrast
Comparison: Noncontrast brain MRI yesterday.

CLINICAL DATA: 31-year-old pregnant female with headache, neck
pain, right side numbness and tingling. Unrevealing Head MRI
yesterday.

EXAM:
MR VENOGRAM HEAD WITHOUT CONTRAST
TECHNIQUE: Multiplanar, multi-echo pulse sequences of the brain and surrounding
structures were acquired without and with intravenous contrast.
Angiographic images of the intracranial venous structures were
acquired using MRV technique without and with intravenous contrast.

[Series 5: tof_fl2d_paracor · coronal · 2.5mm · 0.98mm/px · 8 of 138 slices shown]
[im 1/138]
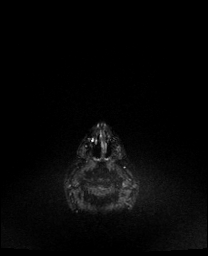
[im 20/138]
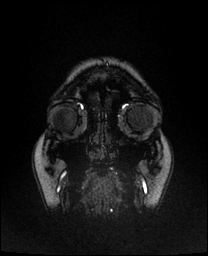
[im 40/138]
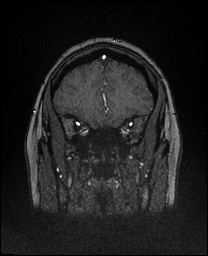
[im 59/138]
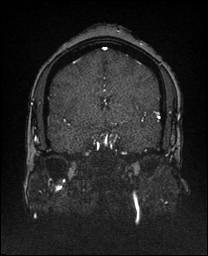
[im 79/138]
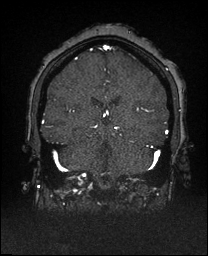
[im 98/138]
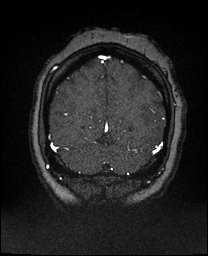
[im 118/138]
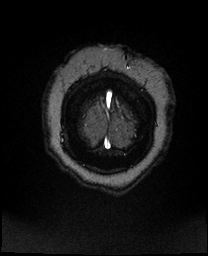
[im 138/138]
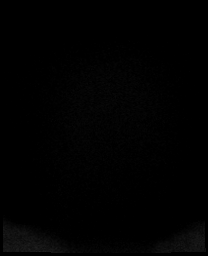

[Series 9: venous inhance coronal · coronal · portal-venous · 0.9mm · 0.57mm/px · 11 of 240 slices shown]
[im 1/240]
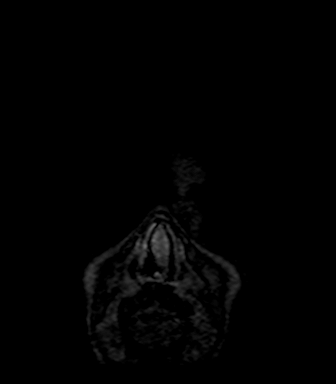
[im 24/240]
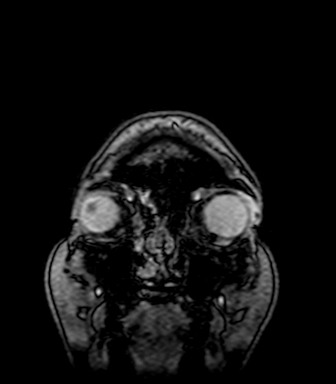
[im 48/240]
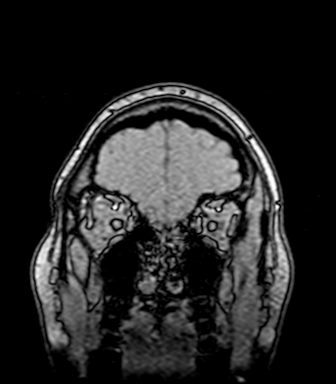
[im 72/240]
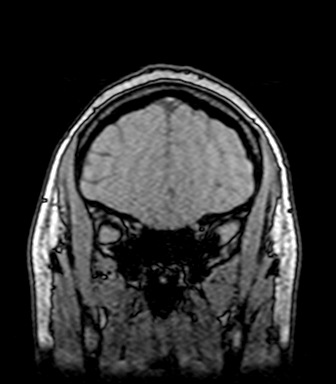
[im 96/240]
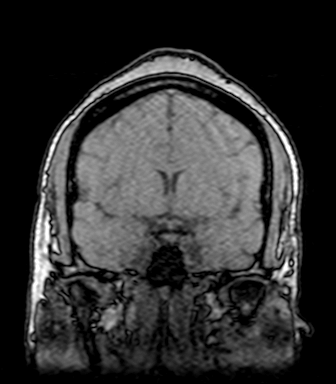
[im 120/240]
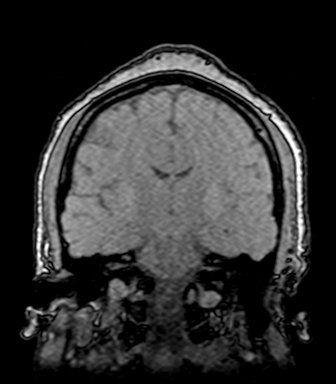
[im 144/240]
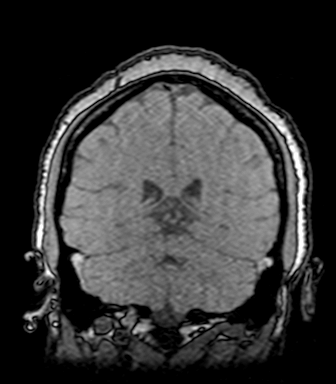
[im 168/240]
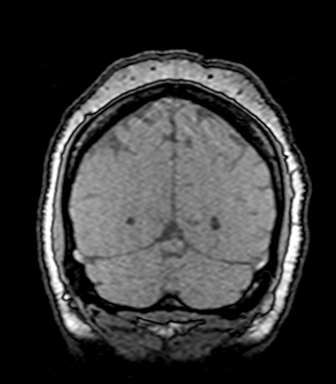
[im 192/240]
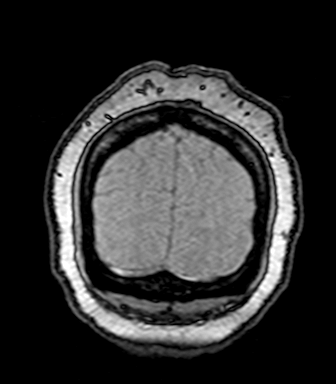
[im 216/240]
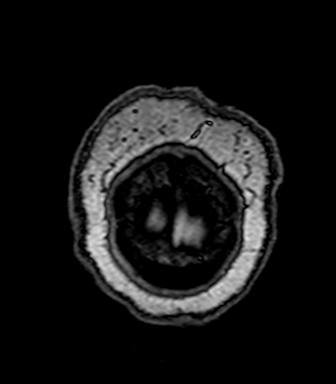
[im 240/240]
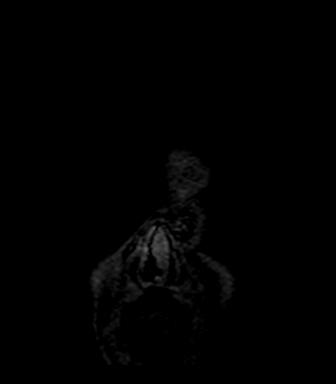

[Series 10: venous inhance coronal_msum · coronal · portal-venous · 0.9mm · 0.57mm/px · 11 of 236 slices shown]
[im 1/236]
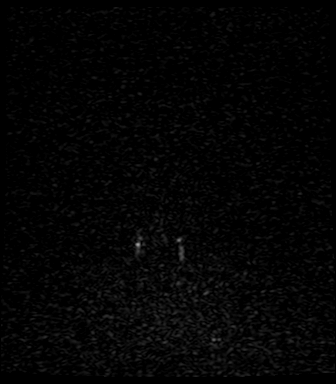
[im 24/236]
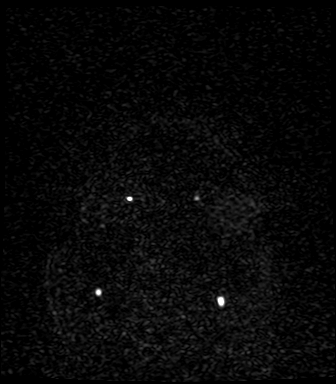
[im 48/236]
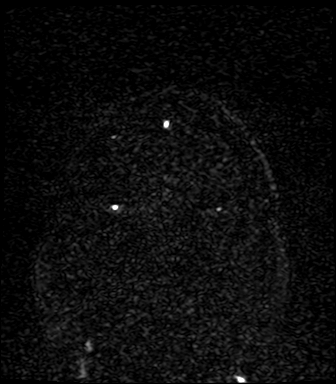
[im 71/236]
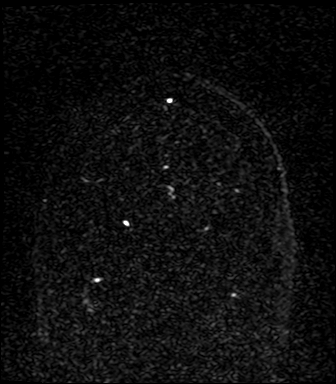
[im 95/236]
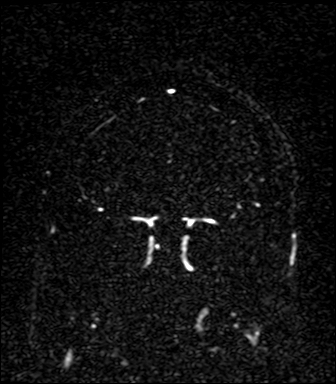
[im 118/236]
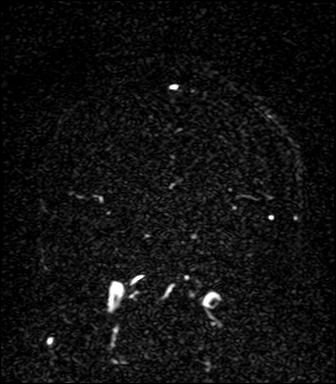
[im 142/236]
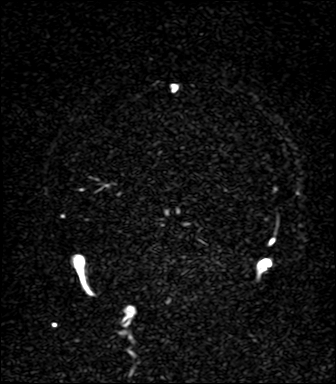
[im 165/236]
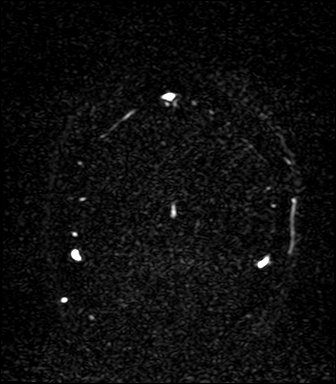
[im 189/236]
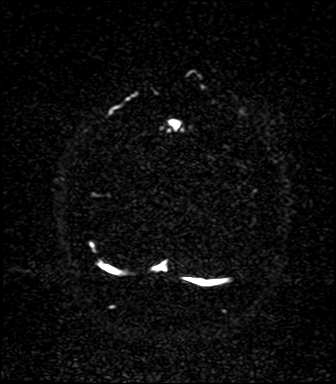
[im 212/236]
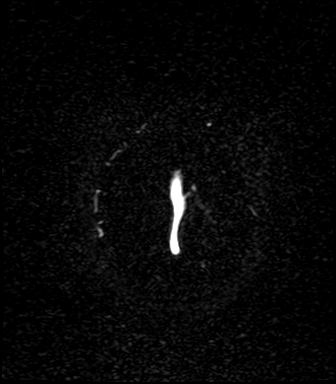
[im 236/236]
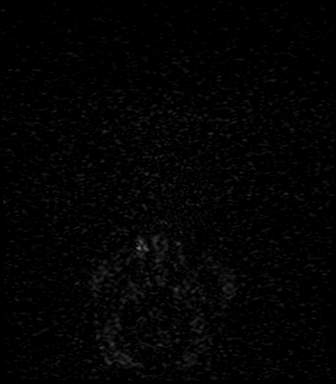

[Series 13: t1_fl3d_sag_p2_iso · sagittal · 1.0mm · 0.98mm/px · 9 of 192 slices shown]
[im 1/192]
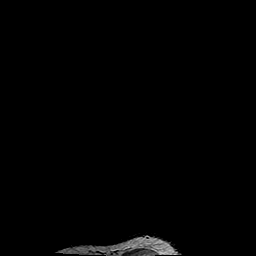
[im 24/192]
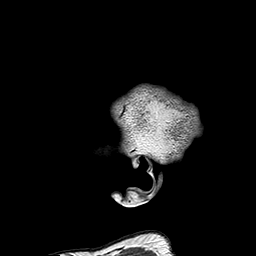
[im 48/192]
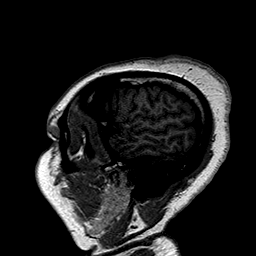
[im 72/192]
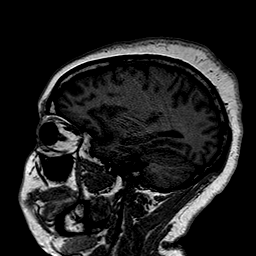
[im 96/192]
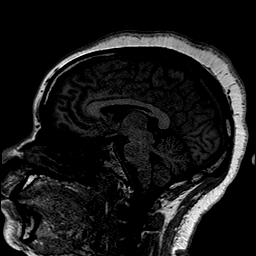
[im 120/192]
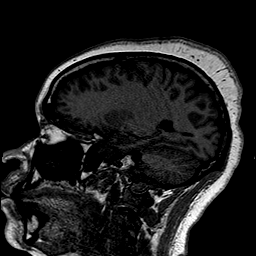
[im 144/192]
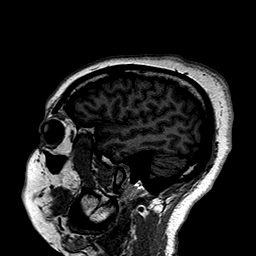
[im 168/192]
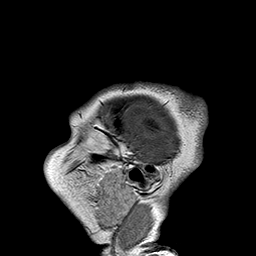
[im 192/192]
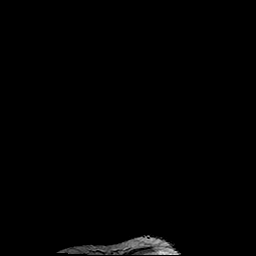

[Series 14: t1_fl3d_sag_p2_iso_mpr_ axial · axial · 2.0mm · 0.45mm/px · z∈[-83,+99]mm · 4 of 94 slices shown]
[im 1/94]
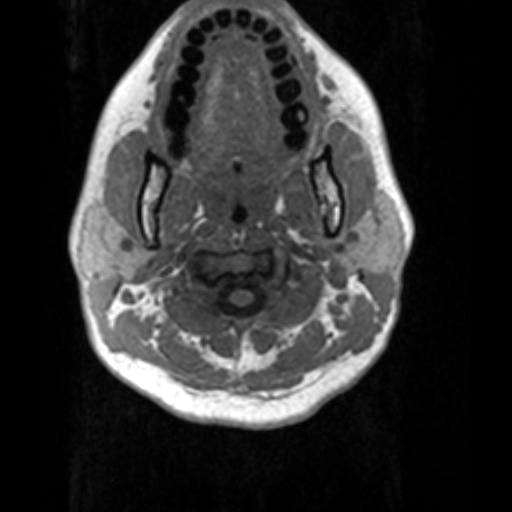
[im 32/94]
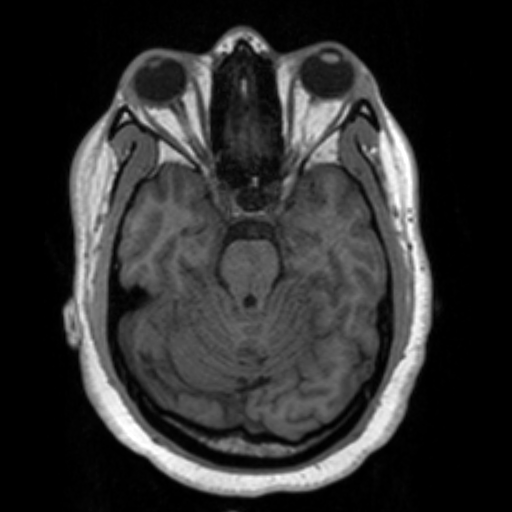
[im 63/94]
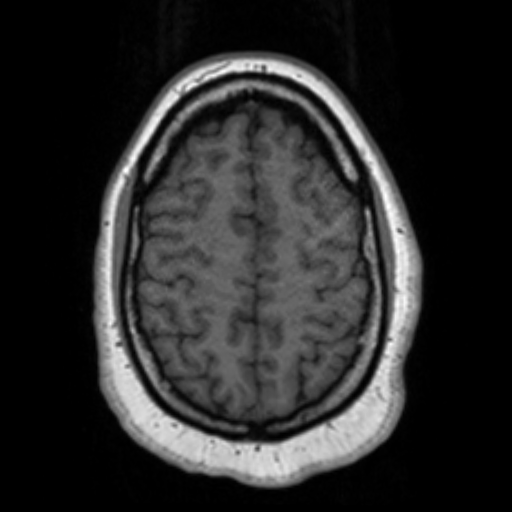
[im 94/94]
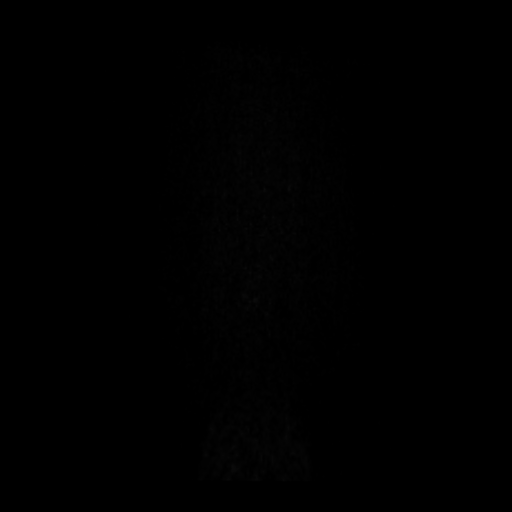

[43 of 48 positions shown; findings below may reference images not displayed]

FINDINGS: MR VENOGRAM HEAD

2D and 3D time-of-flight imaging with IV contrast deferred due to
pregnancy state.

Preserved flow signal in the superior sagittal sinus, torcula,
straight sinus, vein of APINYS, internal cerebral veins, basal veins
of APINYS, transverse sinuses, sigmoid sinuses, and bilateral IJ
bulbs. Preserved flow signal also in the major cortical draining
veins such as the veins of [place] and [place].

No evidence of dural sinus thrombosis.

No intracranial mass effect or midline shift. No ventriculomegaly.
Basilar cisterns remain patent.
IMPRESSION: Normal Intracranial MRV.

## 2021-04-04 MED ORDER — SODIUM CHLORIDE 0.9 % IV SOLN
500.0000 mg | Freq: Once | INTRAVENOUS | Status: AC
  Administered 2021-04-04: 500 mg via INTRAVENOUS
  Filled 2021-04-04: qty 2

## 2021-04-04 MED ORDER — METOCLOPRAMIDE HCL 10 MG PO TABS
10.0000 mg | ORAL_TABLET | Freq: Once | ORAL | Status: AC
  Administered 2021-04-04: 10 mg via ORAL
  Filled 2021-04-04: qty 1

## 2021-04-04 MED ORDER — KETOROLAC TROMETHAMINE 30 MG/ML IJ SOLN
30.0000 mg | Freq: Once | INTRAMUSCULAR | Status: AC
Start: 2021-04-04 — End: 2021-04-04
  Administered 2021-04-04: 30 mg via INTRAVENOUS
  Filled 2021-04-04: qty 1

## 2021-04-04 MED ORDER — CYCLOBENZAPRINE HCL 5 MG PO TABS
10.0000 mg | ORAL_TABLET | Freq: Once | ORAL | Status: AC
  Administered 2021-04-04: 10 mg via ORAL
  Filled 2021-04-04: qty 2

## 2021-04-04 MED ORDER — LACTATED RINGERS IV BOLUS
1000.0000 mL | Freq: Once | INTRAVENOUS | Status: AC
  Administered 2021-04-04: 1000 mL via INTRAVENOUS

## 2021-04-04 MED ORDER — METOCLOPRAMIDE HCL 10 MG PO TABS
10.0000 mg | ORAL_TABLET | Freq: Three times a day (TID) | ORAL | 0 refills | Status: DC | PRN
Start: 2021-04-04 — End: 2021-06-07

## 2021-04-04 MED ORDER — ACETAMINOPHEN 500 MG PO TABS
1000.0000 mg | ORAL_TABLET | Freq: Once | ORAL | Status: AC
Start: 2021-04-04 — End: 2021-04-04
  Administered 2021-04-04: 1000 mg via ORAL
  Filled 2021-04-04: qty 2

## 2021-04-04 MED ORDER — CYCLOBENZAPRINE HCL 10 MG PO TABS: 10.0000 mg | ORAL_TABLET | Freq: Three times a day (TID) | ORAL | 0 refills | Status: DC | PRN

## 2021-04-04 NOTE — MAU Note (Signed)
Radiology called and there are several patients ahead of Ms Quizon. Pt aware and is resting. Lights out and pt has blanket and will try to sleep until radiology calls for her. Pain level still around 6 but pt states she feels alittle better. Pt ambulated to [employer] earlier  and brought back food which she tolerated well.  ?

## 2021-04-04 NOTE — MAU Note (Signed)
After pt received d/c papers the neurologist came over and consulted with Dr Ephriam Jenkins. A different MRI was ordered and to be completed before pt is d/c home. Dr Ephriam Jenkins spoke with pt and pt agrees with plan ?

## 2021-04-05 ENCOUNTER — Other Ambulatory Visit: Payer: Self-pay

## 2021-04-05 ENCOUNTER — Inpatient Hospital Stay (HOSPITAL_COMMUNITY): Payer: Medicaid Other

## 2021-04-05 ENCOUNTER — Inpatient Hospital Stay (HOSPITAL_BASED_OUTPATIENT_CLINIC_OR_DEPARTMENT_OTHER): Payer: Medicaid Other

## 2021-04-05 ENCOUNTER — Inpatient Hospital Stay (HOSPITAL_COMMUNITY)
Admission: AD | Admit: 2021-04-05 | Discharge: 2021-04-05 | Disposition: A | Discharge status: Home / Self Care | Payer: Medicaid Other | Attending: Obstetrics and Gynecology | Admitting: Obstetrics and Gynecology

## 2021-04-05 ENCOUNTER — Encounter (HOSPITAL_COMMUNITY): Payer: Self-pay | Admitting: Obstetrics and Gynecology

## 2021-04-05 DIAGNOSIS — R0602 Shortness of breath: Secondary | ICD-10-CM | POA: Diagnosis not present

## 2021-04-05 DIAGNOSIS — R079 Chest pain, unspecified: Secondary | ICD-10-CM

## 2021-04-05 DIAGNOSIS — Z3480 Encounter for supervision of other normal pregnancy, unspecified trimester: Secondary | ICD-10-CM

## 2021-04-05 DIAGNOSIS — O26892 Other specified pregnancy related conditions, second trimester: Secondary | ICD-10-CM | POA: Diagnosis present

## 2021-04-05 DIAGNOSIS — R519 Headache, unspecified: Secondary | ICD-10-CM | POA: Diagnosis not present

## 2021-04-05 DIAGNOSIS — R0789 Other chest pain: Secondary | ICD-10-CM | POA: Diagnosis not present

## 2021-04-05 DIAGNOSIS — Z3A22 22 weeks gestation of pregnancy: Secondary | ICD-10-CM

## 2021-04-05 DIAGNOSIS — G8929 Other chronic pain: Secondary | ICD-10-CM

## 2021-04-05 LAB — CBC
HCT: 32.9 % — ABNORMAL LOW (ref 36.0–46.0)
Hemoglobin: 11.1 g/dL — ABNORMAL LOW (ref 12.0–15.0)
MCH: 28.5 pg (ref 26.0–34.0)
MCHC: 33.7 g/dL (ref 30.0–36.0)
MCV: 84.6 fL (ref 80.0–100.0)
Platelets: 248 10*3/uL (ref 150–400)
RBC: 3.89 MIL/uL (ref 3.87–5.11)
RDW: 13.7 % (ref 11.5–15.5)
WBC: 10.7 10*3/uL — ABNORMAL HIGH (ref 4.0–10.5)
nRBC: 0 % (ref 0.0–0.2)

## 2021-04-05 LAB — COMPREHENSIVE METABOLIC PANEL
ALT: 24 U/L (ref 0–44)
AST: 18 U/L (ref 15–41)
Albumin: 2.9 g/dL — ABNORMAL LOW (ref 3.5–5.0)
Alkaline Phosphatase: 39 U/L (ref 38–126)
Anion gap: 10 (ref 5–15)
BUN: 5 mg/dL — ABNORMAL LOW (ref 6–20)
CO2: 22 mmol/L (ref 22–32)
Calcium: 8.7 mg/dL — ABNORMAL LOW (ref 8.9–10.3)
Chloride: 104 mmol/L (ref 98–111)
Creatinine, Ser: 0.65 mg/dL (ref 0.44–1.00)
GFR, Estimated: >60 mL/min (ref >60)
Glucose, Bld: 115 mg/dL — ABNORMAL HIGH (ref 70–99)
Potassium: 4.2 mmol/L (ref 3.5–5.1)
Sodium: 136 mmol/L (ref 135–145)
Total Bilirubin: 0.3 mg/dL (ref 0.3–1.2)
Total Protein: 6.2 g/dL — ABNORMAL LOW (ref 6.5–8.1)

## 2021-04-05 LAB — BRAIN NATRIURETIC PEPTIDE: B Natriuretic Peptide: 58.3 pg/mL (ref 0.0–100.0)

## 2021-04-05 LAB — ECHOCARDIOGRAM COMPLETE: S' Lateral: 2.6 cm

## 2021-04-05 LAB — TROPONIN I (HIGH SENSITIVITY)
Troponin I (High Sensitivity): 24 ng/L — ABNORMAL HIGH (ref <18)
Troponin I (High Sensitivity): 29 ng/L — ABNORMAL HIGH (ref <18)

## 2021-04-05 IMAGING — DX DG CHEST 1V PORT
1 series · 1 of 1 positions shown · non-contrast
Comparison: None.

CLINICAL DATA: 31-year-old female with chest pressure, 22 weeks
pregnant.

EXAM:
PORTABLE CHEST - 1 VIEW

[chest ap]
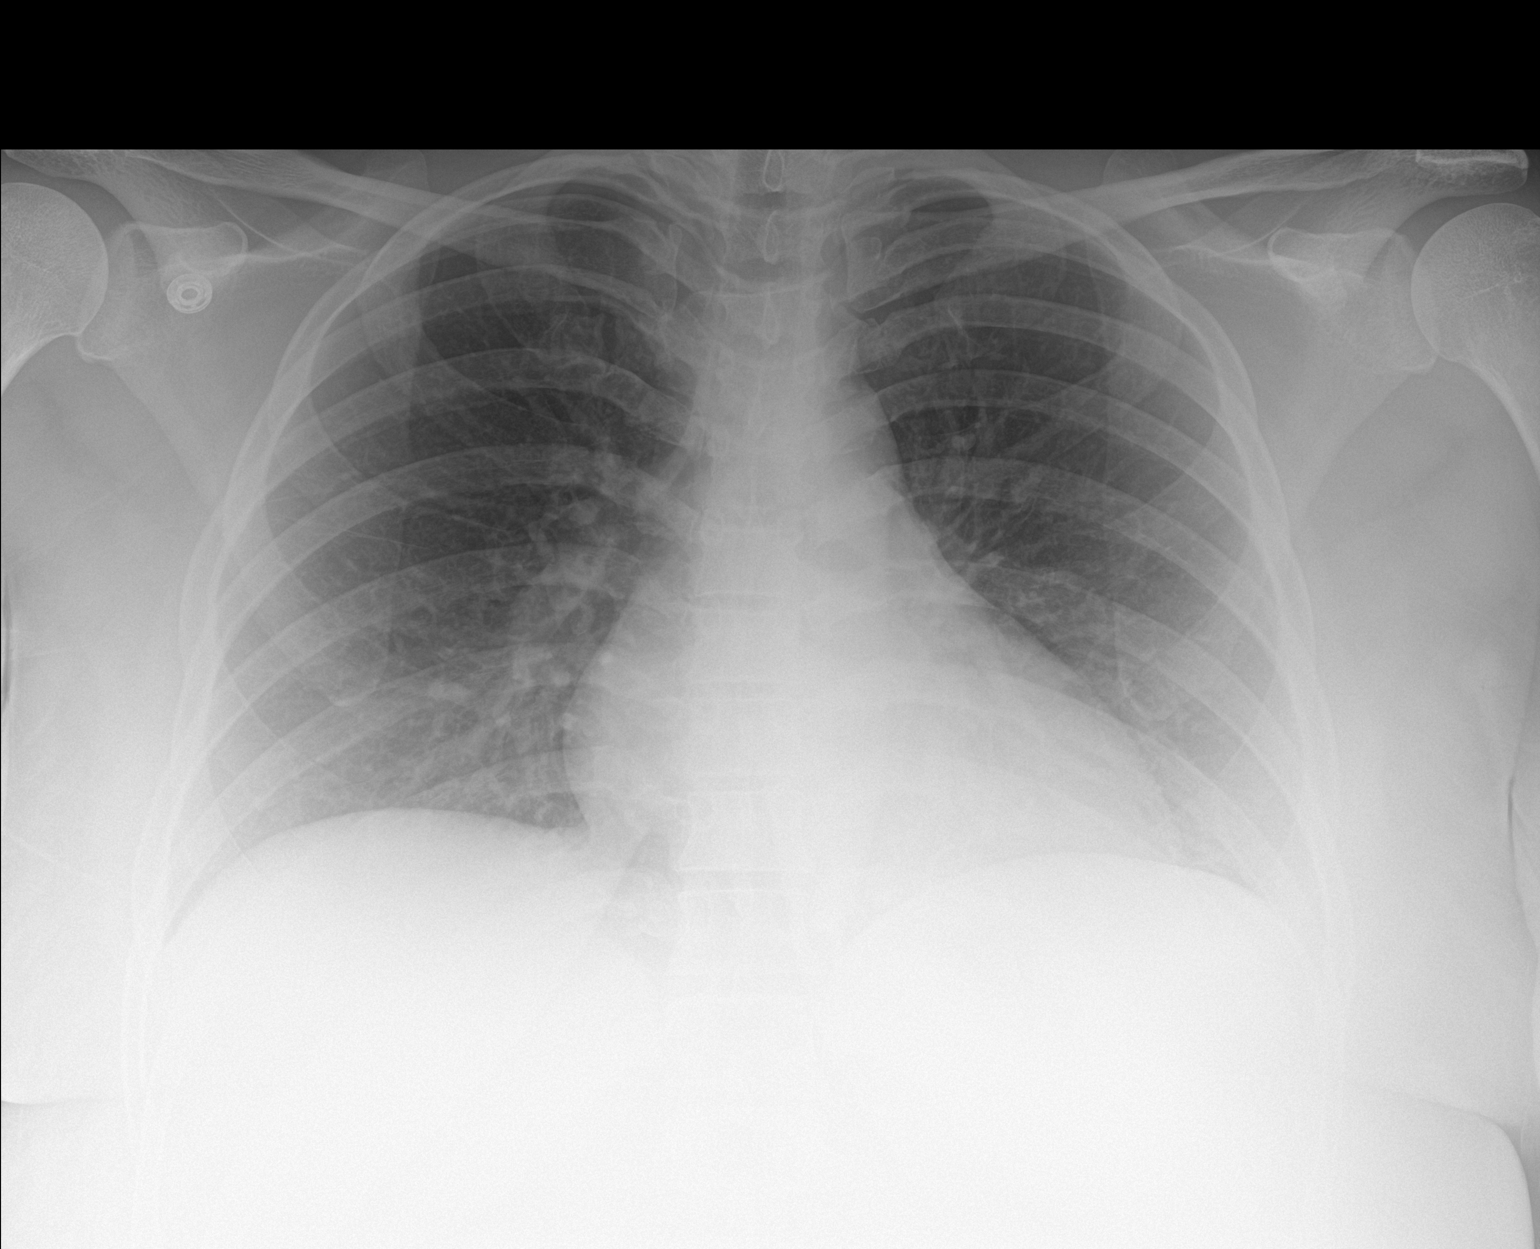

[1 of 1 positions shown; findings below may reference images not displayed]

FINDINGS: The mediastinal contours are within normal limits. No cardiomegaly.
The lungs are clear bilaterally without evidence of focal
consolidation, pleural effusion, or pneumothorax. No acute osseous
abnormality.
IMPRESSION: No acute cardiopulmonary process.

## 2021-04-05 MED ORDER — CYCLOBENZAPRINE HCL 5 MG PO TABS
10.0000 mg | ORAL_TABLET | Freq: Once | ORAL | Status: AC
Start: 2021-04-05 — End: 2021-04-05
  Administered 2021-04-05: 10 mg via ORAL
  Filled 2021-04-05: qty 2

## 2021-04-05 MED ORDER — LACTATED RINGERS IV BOLUS
1000.0000 mL | Freq: Once | INTRAVENOUS | Status: AC
  Administered 2021-04-05: 1000 mL via INTRAVENOUS

## 2021-04-05 MED ORDER — MAGNESIUM 400 MG PO TABS: 400.0000 mg | ORAL_TABLET | Freq: Every day | ORAL | 2 refills | Status: DC

## 2021-04-05 MED ORDER — SODIUM CHLORIDE 0.9 % IV SOLN: 500.0000 mg | Freq: Once | INTRAVENOUS | Status: DC

## 2021-04-05 MED ORDER — DIAZEPAM 5 MG PO TABS
5.0000 mg | ORAL_TABLET | Freq: Once | ORAL | Status: AC
  Administered 2021-04-05: 5 mg via ORAL
  Filled 2021-04-05: qty 1

## 2021-04-05 MED ORDER — LACTATED RINGERS IV SOLN: INTRAVENOUS | Status: DC

## 2021-04-05 MED ORDER — MAGNESIUM SULFATE IN D5W 1-5 GM/100ML-% IV SOLN
1.0000 g | Freq: Once | INTRAVENOUS | Status: AC
Start: 2021-04-05 — End: 2021-04-05
  Administered 2021-04-05: 1 g via INTRAVENOUS
  Filled 2021-04-05: qty 100

## 2021-04-05 MED ORDER — ACETAMINOPHEN-CAFFEINE 500-65 MG PO TABS
2.0000 | ORAL_TABLET | Freq: Once | ORAL | Status: AC
  Administered 2021-04-05: 2 via ORAL
  Filled 2021-04-05 (×2): qty 2

## 2021-04-05 MED ORDER — IOHEXOL 350 MG/ML SOLN
60.0000 mL | Freq: Once | INTRAVENOUS | Status: AC | PRN
  Administered 2021-04-05: 60 mL via INTRAVENOUS

## 2021-04-05 NOTE — Progress Notes (Signed)
Reviewed patients TTE ?Report in Epic/Syngo ? ?Normal echo  ?EF 60-65% no RWMA;s ?Normal valves  ?Normal RV ?No pericardial effusion  ? ?No further cardiac w/u for atypical chest pain indicated at this time ? ?Charlton Haws MD [facility] ?

## 2021-04-05 NOTE — Consult Note (Signed)
CARDIOLOGY CONSULT NOTE  ? ? ? ? ? ?Patient ID: ?Lisa Brown ?MRN: [id number] ?DOB/AGE: 04-10-1990 31 y.o. ? ?Admit date: 04/05/2021 ?Referring Physician: Para March ?Primary Physician: Patient, No Pcp Per (Inactive) ?Primary Cardiologist: None ?Reason for Consultation: Chest Pain ? ?Active Problems: ?  * No active hospital problems. * ? ? ?HPI:  31 y.o. black female [redacted] weeks pregnant with 2nd child. Has a 24 yo daughter at home. Normal pregnancy with no pre eclampsia/diabetes. Has been to [facility]U twice now with headaches , neck pain and today atypical chest pain. Pain is sharp and over left side chest/neck. No pleurisy Troponin's not significant 24->29 CTA no PE and normal ascending thoracic aorta. Note made of age advanced coronary calcium but she has mild calcium in mid LAD. ECG is normal She has a family history of sickle cell and trait but she has not been tested Hct 32.9 WBC mildly elevated 10.7 ? ?ROS ?All other systems reviewed and negative except as noted above ? ?Past Medical History:  ?Diagnosis Date  ? Asthma   ?  ?Family History  ?Problem Relation Age of Onset  ? Diabetes Mother   ? Diabetes Father   ? Kidney disease Father   ? Hypertension Father   ? Liver disease Father   ? Cancer Maternal Grandmother   ? Diabetes Maternal Grandmother   ? Heart disease Maternal Grandmother   ? Diabetes Maternal Grandfather   ?  ?Social History  ? ?Socioeconomic History  ? Marital status: Single  ?  Spouse name: Darius  ? Number of children: 1  ? Years of education: 49  ? Highest education level: Not on file  ?Occupational History  ? Not on file  ?Tobacco Use  ? Smoking status: Never  ? Smokeless tobacco: Never  ?Vaping Use  ? Vaping Use: Never used  ?Substance and Sexual Activity  ? Alcohol use: No  ? Drug use: No  ? Sexual activity: Yes  ?  Birth control/protection: None  ?  Comment: Hx of BCPs, and implanon  ?Other Topics Concern  ? Not on file  ?Social History Narrative  ? Not on file  ? ?Social Determinants of Health   ? ?Financial Resource Strain: Not on file  ?Food Insecurity: No Food Insecurity  ? Worried About Programme researcher, broadcasting/film/video in the Last Year: Never true  ? Ran Out of Food in the Last Year: Never true  ?Transportation Needs: Unmet Transportation Needs  ? Lack of Transportation (Medical): No  ? Lack of Transportation (Non-Medical): Yes  ?Physical Activity: Not on file  ?Stress: Not on file  ?Social Connections: Not on file  ?Intimate Partner Violence: Not on file  ?  ?Past Surgical History:  ?Procedure Laterality Date  ? NO PAST SURGERIES    ?  ? ? ?Current Facility-Administered Medications:  ?  lactated ringers infusion, , Intravenous, Continuous, Venora Maples, MD, Stopped at 04/05/21 1437 ? ? lactated ringers Stopped (04/05/21 1437)  ? ? ?Physical Exam: ?Blood pressure 129/72, pulse (!) 106, temperature 99.2 ?F (37.3 ?C), temperature source Oral, resp. rate 18, last menstrual period 11/02/2020, SpO2 97 %.   ? ?Affect appropriate ?Overweight black female pregnant  ?HEENT: normal ?Neck supple with no adenopathy ?JVP normal no bruits no thyromegaly ?Lungs clear with no wheezing and good diaphragmatic motion ?Heart:  S1/S2 Summation gallop no murmur, no rub, gallop or click ?PMI normal ?Abdomen: benighn, BS positve, no tenderness, no AAA ?no bruit.  No HSM or HJR ?Distal pulses intact  with no bruits ?No edema ?Neuro non-focal ?Skin warm and dry ?No muscular weakness ? ? ?Labs: ?  ?Lab Results  ?Component Value Date  ? WBC 10.7 (H) 04/05/2021  ? HGB 11.1 (L) 04/05/2021  ? HCT 32.9 (L) 04/05/2021  ? MCV 84.6 04/05/2021  ? PLT 248 04/05/2021  ?  ?Recent Labs  ?Lab 04/05/21 ?6256  ?NA 136  ?K 4.2  ?CL 104  ?CO2 22  ?BUN 5*  ?CREATININE 0.65  ?CALCIUM 8.7*  ?PROT 6.2*  ?BILITOT 0.3  ?ALKPHOS 39  ?ALT 24  ?AST 18  ?GLUCOSE 115*  ? ?No results found for: CKTOTAL, CKMB, CKMBINDEX, TROPONINI No results found for: CHOL ?No results found for: HDL ?No results found for: LDLCALC ?No results found for: TRIG ?No results found for:  CHOLHDL ?No results found for: LDLDIRECT  ?  ?Radiology: ?CT Angio Chest Pulmonary Embolism (PE) W or WO Contrast ? ?Result Date: 04/05/2021 ?CLINICAL DATA:  Neck and chest pain. Pulmonary embolism suspected. Twenty-two weeks pregnant. EXAM: CT ANGIOGRAPHY CHEST WITH CONTRAST TECHNIQUE: Multidetector CT imaging of the chest was performed using the standard protocol during bolus administration of intravenous contrast. Multiplanar CT image reconstructions and MIPs were obtained to evaluate the vascular anatomy. RADIATION DOSE REDUCTION: This exam was performed according to the departmental dose-optimization program which includes automated exposure control, adjustment of the mA and/or kV according to patient size and/or use of iterative reconstruction technique. CONTRAST:  43mL OMNIPAQUE IOHEXOL 350 MG/ML SOLN COMPARISON:  Chest radiograph of earlier today. FINDINGS: Cardiovascular: The quality of this exam for evaluation of pulmonary embolism is poor. The bolus is extremely poorly timed, with contrast centered in the SVC. No dominant filling defect within the main pulmonary arteries. Smaller emboli cannot be excluded. Normal caliber of the aorta and branch vessels. Proximal LAD coronary artery calcification, including on 114/7 and coronal image 64. Mediastinum/Nodes: No mediastinal or hilar adenopathy. Tiny hiatal hernia. Fluid level in the esophagus on 60/6. Lungs/Pleura: No pleural fluid. Mild bibasilar subsegmental atelectasis. Upper Abdomen: Normal imaged portions of the liver, spleen, stomach, pancreas, gallbladder, adrenal glands, kidneys. Musculoskeletal: No acute osseous abnormality. Review of the MIP images confirms the above findings. IMPRESSION: 1. Extremely poor evaluation for pulmonary embolism. No dominant central/saddle embolism identified. 2. Markedly age advanced coronary artery atherosclerosis, with calcified plaque in the proximal LAD. Correlate with risk factors and whether patient's neck and chest  symptoms could be attributed to cardiac ischemia. 3. Esophageal air fluid level suggests dysmotility or gastroesophageal reflux. Tiny hiatal hernia. Electronically Signed   By: Jeronimo Greaves M.D.   On: 04/05/2021 15:31  ? ?MR BRAIN WO CONTRAST ? ?Addendum Date: 04/03/2021   ?ADDENDUM REPORT: 04/03/2021 22:33 ADDENDUM: Correction: NO restricted diffusion to suggest acute or subacute infarct. Electronically Signed   By: Wiliam Ke M.D.   On: 04/03/2021 22:33  ? ?Result Date: 04/03/2021 ?CLINICAL DATA:  Headache, neck pain, right-sided numbness and tingling. EXAM: MRI HEAD WITHOUT CONTRAST TECHNIQUE: Multiplanar, multiecho pulse sequences of the brain and surrounding structures were obtained without intravenous contrast. COMPARISON:  None. FINDINGS: Brain: Restricted diffusion to suggest acute or subacute infarct. No acute hemorrhage, mass, mass effect, or midline shift. No hydrocephalus or extra-axial collection. No hemosiderin deposition to suggest remote hemorrhage. No hydrocephalus or extra-axial collection. Vascular: Normal flow voids. Skull and upper cervical spine: Normal marrow signal. Sinuses/Orbits: Mucosal thickening in the posterior left ethmoid air cells, left sphenoid sinus, and right frontal sinus. Otherwise unremarkable. Other: The mastoids are well aerated. IMPRESSION: No  acute intracranial process. No etiology is seen for the patient's headache, neck pain, or right-sided symptoms. Electronically Signed: By: Wiliam KeAlison  Vasan M.D. On: 04/03/2021 22:17  ? ?MR Venogram Head ? ?Result Date: 04/04/2021 ?CLINICAL DATA:  31 year old pregnant female with headache, neck pain, right side numbness and tingling. Unrevealing Head MRI yesterday. EXAM: MR VENOGRAM HEAD WITHOUT CONTRAST TECHNIQUE: Multiplanar, multi-echo pulse sequences of the brain and surrounding structures were acquired without and with intravenous contrast. Angiographic images of the intracranial venous structures were acquired using MRV technique  without and with intravenous contrast. COMPARISON:  Noncontrast brain MRI yesterday. FINDINGS: MR VENOGRAM HEAD 2D and 3D time-of-flight imaging with IV contrast deferred due to pregnancy state. Preserved flow s

## 2021-04-05 NOTE — MAU Note (Signed)
.  Lisa Brown is a 31 y.o. at [redacted]w[redacted]d here in MAU reporting: H/A, neck & chest pain.  Reports was discharged yesterday morning from MAU for same symptoms except chest pain.  Reports chest and neck pain as tightness and it's constant.  Reports took meds as prescribed and no relief. ? ?Onset of complaint: ongoing ?Pain score: 10/10 headache & neck 9/10 chest pain ?Vitals:  ? 04/05/21 0839 04/05/21 0842  ?BP: 130/75 130/75  ?Pulse: (!) 109 (!) 107  ?Resp: 20 20  ?Temp: 98.4 ?F (36.9 ?C) 98.4 ?F (36.9 ?C)  ?SpO2: 100%   ?   ?FHT: 157bpm ?Lab orders placed from triage:   None ?

## 2021-04-05 NOTE — Progress Notes (Signed)
Cardiologist @ bedside discussing POC, includes ultrasound of heart.  Also discussed lab, EKG, & CT results, states he doesn't think there is any "heart issues".  Pt verbalized understanding & agreeable. ?

## 2021-04-05 NOTE — MAU Note (Signed)
Pt waiting in room with her grandmother for ride to come ?

## 2021-04-05 NOTE — Progress Notes (Signed)
Dr Crissie Reese in earlier to discuss d/c  plan. Written and verbal d/c instructions given and understanding voiced. ?

## 2021-04-05 NOTE — Progress Notes (Signed)
?  Echocardiogram ?2D Echocardiogram has been performed. ? ?Lisa Brown ?04/05/2021, 5:44 PM ?

## 2021-04-05 NOTE — MAU Provider Note (Signed)
?History  ?  ? ?[id number] ? ?Arrival date and time: 04/05/21 1572 ?  ? ?Chief Complaint  ?Patient presents with  ? Neck Pain  ? Headache  ? Difficulty Breathing  ? ? ? ?HPI ?Lisa Brown is a 31 y.o. at [redacted]w[redacted]d by 6 wks [place], who presents for headache, chest tightness, and shortness of breath.  ? ?Patient was recently seen in the MAU two days prior ?At that time had significant headache and some neurological symptoms ?Had extensive workup that included MRI head and MRV head, all workup was negative ?Headache somewhat improved with variety of treatments, discharged home with plan to follow up with Neurology as an outpatient ? ?This morning patient returned reporting ongoing headache and L neck pain ?However she has also been experiencing chest tightness like a band as well as shortness of breath and difficulty catching her breath ?She feels like she can not lay flat without becoming unbearably short of breath ?She does not smoke, no recent travel, no personal or family hx of blood clots ? ?Denies vaginal bleeding, loss of fluid, contractions ?Normal fetal movement ? ? ?O/Positive/-- (12/19 1429) ? ?OB History   ? ? Gravida  ?5  ? Para  ?1  ? Term  ?1  ? Preterm  ?0  ? AB  ?3  ? Living  ?1  ?  ? ? SAB  ?1  ? IAB  ?2  ? Ectopic  ?0  ? Multiple  ?0  ? Live Births  ?1  ?   ?  ?  ? ? ?Past Medical History:  ?Diagnosis Date  ? Asthma   ? ? ?Past Surgical History:  ?Procedure Laterality Date  ? NO PAST SURGERIES    ? ? ?Family History  ?Problem Relation Age of Onset  ? Diabetes Mother   ? Diabetes Father   ? Kidney disease Father   ? Hypertension Father   ? Liver disease Father   ? Cancer Maternal Grandmother   ? Diabetes Maternal Grandmother   ? Heart disease Maternal Grandmother   ? Diabetes Maternal Grandfather   ? ? ?Social History  ? ?Socioeconomic History  ? Marital status: Single  ?  Spouse name: Darius  ? Number of children: 1  ? Years of education: 89  ? Highest education level: Not on file  ?Occupational History  ?  Not on file  ?Tobacco Use  ? Smoking status: Never  ? Smokeless tobacco: Never  ?Vaping Use  ? Vaping Use: Never used  ?Substance and Sexual Activity  ? Alcohol use: No  ? Drug use: No  ? Sexual activity: Yes  ?  Birth control/protection: None  ?  Comment: Hx of BCPs, and implanon  ?Other Topics Concern  ? Not on file  ?Social History Narrative  ? Not on file  ? ?Social Determinants of Health  ? ?Financial Resource Strain: Not on file  ?Food Insecurity: No Food Insecurity  ? Worried About Charity fundraiser in the Last Year: Never true  ? Ran Out of Food in the Last Year: Never true  ?Transportation Needs: Unmet Transportation Needs  ? Lack of Transportation (Medical): No  ? Lack of Transportation (Non-Medical): Yes  ?Physical Activity: Not on file  ?Stress: Not on file  ?Social Connections: Not on file  ?Intimate Partner Violence: Not on file  ? ? ?No Known Allergies ? ?No current facility-administered medications on file prior to encounter.  ? ?Current Outpatient Medications on File Prior to Encounter  ?  Medication Sig Dispense Refill  ? cyclobenzaprine (FLEXERIL) 10 MG tablet Take 1 tablet (10 mg total) by mouth 3 (three) times daily as needed (headache). 30 tablet 0  ? metoCLOPramide (REGLAN) 10 MG tablet Take 1 tablet (10 mg total) by mouth every 8 (eight) hours as needed for nausea (or headache). 30 tablet 0  ? acetaminophen (TYLENOL) 325 MG tablet Take 2 tablets (650 mg total) by mouth every 6 (six) hours as needed. 60 tablet 0  ? Blood Pressure Monitoring (BLOOD PRESSURE KIT) DEVI 1 Device by Does not apply route once a week. 1 each 0  ? Magnesium Oxide -Mg Supplement 400 MG CAPS Take 400 mg by mouth daily. Increase to twice daily if symptoms persists. (Patient not taking: Reported on 03/15/2021) 60 capsule 1  ? Prenatal Vit-Fe Fumarate-FA (PREPLUS) 27-1 MG TABS Take 1 tablet by mouth daily. 30 tablet 13  ? ? ? ?ROS ?Pertinent positives and negative per HPI, all others reviewed and negative ? ?Physical Exam   ? ?BP 126/66 (BP Location: Left Arm)   Pulse (!) 119   Temp 99.2 ?F (37.3 ?C) (Oral)   Resp 16   LMP 11/02/2020   SpO2 99%  ? ?Patient Vitals for the past 24 hrs: ? BP Temp Temp src Pulse Resp SpO2  ?04/05/21 1927 126/66 -- -- (!) 119 16 99 %  ?04/05/21 1725 -- -- -- -- -- 100 %  ?04/05/21 1700 -- -- -- -- -- 97 %  ?04/05/21 1650 -- -- -- -- -- 97 %  ?04/05/21 1634 129/72 99.2 ?F (37.3 ?C) Oral (!) 106 18 97 %  ?04/05/21 1329 135/74 98.5 ?F (36.9 ?C) Oral (!) 118 20 97 %  ?04/05/21 1009 138/80 -- -- (!) 109 19 99 %  ?04/05/21 0945 -- -- -- -- -- 96 %  ?04/05/21 0930 -- -- -- -- -- 100 %  ?04/05/21 0909 -- -- -- -- -- 100 %  ?04/05/21 0842 130/75 98.4 ?F (36.9 ?C) Oral (!) 107 20 --  ?04/05/21 0839 130/75 98.4 ?F (36.9 ?C) Oral (!) 109 20 100 %  ?04/05/21 0838 -- -- -- -- -- 100 %  ? ? ?Physical Exam ?Vitals reviewed.  ?Constitutional:   ?   General: She is not in acute distress. ?   Appearance: She is well-developed. She is not diaphoretic.  ?Eyes:  ?   General: No scleral icterus. ?Cardiovascular:  ?   Rate and Rhythm: Tachycardia present.  ?   Heart sounds:  ?  Gallop present.  ?   Comments: S3 vs S4 auscultated on exam, no murmur appreciated though difficult exam due to habitus ?Pulmonary:  ?   Effort: Pulmonary effort is normal. No respiratory distress.  ?Abdominal:  ?   General: There is no distension.  ?   Palpations: Abdomen is soft.  ?   Tenderness: There is no abdominal tenderness. There is no guarding or rebound.  ?Musculoskeletal:  ?   Comments: Marked spasm and tenderness to palpation of R paracervical muscles  ?Skin: ?   General: Skin is warm and dry.  ?Neurological:  ?   Mental Status: She is alert.  ?   Coordination: Coordination normal.  ?  ? ?Cervical Exam ?  ? ?Bedside Ultrasound ?Not done ? ?My interpretation: n/a ? ?FHT ?157 bpm by doppler ? ?Labs ?Results for orders placed or performed during the hospital encounter of 04/05/21 (from the past 24 hour(s))  ?CBC     Status: Abnormal  ?  Collection  Time: 04/05/21  9:47 AM  ?Result Value Ref Range  ? WBC 10.7 (H) 4.0 - 10.5 K/uL  ? RBC 3.89 3.87 - 5.11 MIL/uL  ? Hemoglobin 11.1 (L) 12.0 - 15.0 g/dL  ? HCT 32.9 (L) 36.0 - 46.0 %  ? MCV 84.6 80.0 - 100.0 fL  ? MCH 28.5 26.0 - 34.0 pg  ? MCHC 33.7 30.0 - 36.0 g/dL  ? RDW 13.7 11.5 - 15.5 %  ? Platelets 248 150 - 400 K/uL  ? nRBC 0.0 0.0 - 0.2 %  ?Comprehensive metabolic panel     Status: Abnormal  ? Collection Time: 04/05/21  9:47 AM  ?Result Value Ref Range  ? Sodium 136 135 - 145 mmol/L  ? Potassium 4.2 3.5 - 5.1 mmol/L  ? Chloride 104 98 - 111 mmol/L  ? CO2 22 22 - 32 mmol/L  ? Glucose, Bld 115 (H) 70 - 99 mg/dL  ? BUN 5 (L) 6 - 20 mg/dL  ? Creatinine, Ser 0.65 0.44 - 1.00 mg/dL  ? Calcium 8.7 (L) 8.9 - 10.3 mg/dL  ? Total Protein 6.2 (L) 6.5 - 8.1 g/dL  ? Albumin 2.9 (L) 3.5 - 5.0 g/dL  ? AST 18 15 - 41 U/L  ? ALT 24 0 - 44 U/L  ? Alkaline Phosphatase 39 38 - 126 U/L  ? Total Bilirubin 0.3 0.3 - 1.2 mg/dL  ? GFR, Estimated >60 >60 mL/min  ? Anion gap 10 5 - 15  ?Troponin I (High Sensitivity)     Status: Abnormal  ? Collection Time: 04/05/21  9:47 AM  ?Result Value Ref Range  ? Troponin I (High Sensitivity) 24 (H) <18 ng/L  ?Brain natriuretic peptide     Status: None  ? Collection Time: 04/05/21  9:47 AM  ?Result Value Ref Range  ? B Natriuretic Peptide 58.3 0.0 - 100.0 pg/mL  ?Troponin I (High Sensitivity)     Status: Abnormal  ? Collection Time: 04/05/21 11:38 AM  ?Result Value Ref Range  ? Troponin I (High Sensitivity) 29 (H) <18 ng/L  ? ? ?Imaging ?CT Angio Chest Pulmonary Embolism (PE) W or WO Contrast ? ?Result Date: 04/05/2021 ?CLINICAL DATA:  Neck and chest pain. Pulmonary embolism suspected. Twenty-two weeks pregnant. EXAM: CT ANGIOGRAPHY CHEST WITH CONTRAST TECHNIQUE: Multidetector CT imaging of the chest was performed using the standard protocol during bolus administration of intravenous contrast. Multiplanar CT image reconstructions and MIPs were obtained to evaluate the vascular anatomy.  RADIATION DOSE REDUCTION: This exam was performed according to the departmental dose-optimization program which includes automated exposure control, adjustment of the mA and/or kV according to patient size and/or u

## 2021-04-05 NOTE — MAU Provider Note (Signed)
Spoke to Dr. Crissie Reese regarding plan of care.  ?Dr. Crissie Reese consulted with Cardiology who is coming to MAU to evaluate the patient. ? ?Patient Vitals for the past 24 hrs: ? BP Temp Temp src Pulse Resp SpO2  ?04/05/21 1650 -- -- -- -- -- 97 %  ?04/05/21 1634 129/72 99.2 ?F (37.3 ?C) Oral (!) 106 18 97 %  ?04/05/21 1329 135/74 98.5 ?F (36.9 ?C) Oral (!) 118 20 97 %  ?04/05/21 1009 138/80 -- -- (!) 109 19 99 %  ?04/05/21 0945 -- -- -- -- -- 96 %  ?04/05/21 0930 -- -- -- -- -- 100 %  ?04/05/21 0909 -- -- -- -- -- 100 %  ?04/05/21 0842 130/75 98.4 ?F (36.9 ?C) Oral (!) 107 20 --  ?04/05/21 0839 130/75 98.4 ?F (36.9 ?C) Oral (!) 109 20 100 %  ?04/05/21 0838 -- -- -- -- -- 100 %  ?  ? ?Duane Lope, NP ?04/05/2021 ?5:29 PM ? ?

## 2021-04-05 NOTE — Progress Notes (Signed)
Pt requesting to ambulate, uncomfortable lying in the bed.  Pt to ambulate in unit ?

## 2021-04-11 ENCOUNTER — Other Ambulatory Visit: Payer: Self-pay

## 2021-04-11 ENCOUNTER — Ambulatory Visit: Payer: Medicaid Other | Attending: Family Medicine | Admitting: Physical Therapy

## 2021-04-11 ENCOUNTER — Encounter: Payer: Self-pay | Admitting: Physical Therapy

## 2021-04-11 DIAGNOSIS — R293 Abnormal posture: Secondary | ICD-10-CM | POA: Insufficient documentation

## 2021-04-11 DIAGNOSIS — G44209 Tension-type headache, unspecified, not intractable: Secondary | ICD-10-CM | POA: Diagnosis not present

## 2021-04-11 DIAGNOSIS — R252 Cramp and spasm: Secondary | ICD-10-CM | POA: Insufficient documentation

## 2021-04-11 DIAGNOSIS — M542 Cervicalgia: Secondary | ICD-10-CM | POA: Insufficient documentation

## 2021-04-11 NOTE — Patient Instructions (Signed)

## 2021-04-11 NOTE — Therapy (Signed)
?OUTPATIENT PHYSICAL THERAPY CERVICAL EVALUATION ? ? ?Patient Name: Lisa Brown ?MRN: [id number] ?DOB:[birth year], 31 y.o., female ?Today's Date: 04/11/2021 ? ? PT End of Session - 04/11/21 1204   ? ? Visit Number 1   ? Date for PT Re-Evaluation 05/09/21   ? Authorization Type [employer]   ? Authorization Time Period requesting 1x/week x 4 weeks 04-11-21 to 05-09-21   ? PT Start Time 0945   ? PT Stop Time 1018   ? PT Time Calculation (min) 33 min   ? Activity Tolerance Patient tolerated treatment well   ? Behavior During Therapy [facility] for tasks assessed/performed   ? ?  ?  ? ?  ? ? ?Past Medical History:  ?Diagnosis Date  ? Asthma   ? ?Past Surgical History:  ?Procedure Laterality Date  ? NO PAST SURGERIES    ? ?Patient Active Problem List  ? Diagnosis Date Noted  ? ASCUS of cervix with negative high risk HPV 01/10/2021  ? Supervision of other normal pregnancy, antepartum 12/28/2020  ? BMI 36.0-36.9,adult 10/06/2015  ? Marijuana use 10/06/2015  ? ? ?REFERRING PROVIDER: Warner Mccreedy, MD ? ?REFERRING DIAG: M54.2 (ICD-10-CM) - Neck pain G44.209 (ICD-10-CM) - Tension headache  ? ?THERAPY DIAG:  ?Cramp and spasm ? ?Abnormal posture ? ?ONSET DATE: 04/03/21 ? ?SUBJECTIVE:                                                                                                                                                                                                        ? ?SUBJECTIVE STATEMENT: ?Pt is a 31yo female referred to OPPT for headache, neck and Rt arm pain.  Rt arm symptoms have resolved.  She has been having headaches since becoming pregnant.  Headache became so severe last week she went to hospital.  She was admitted and discharged from hospital 2x earlier this month for assessment of headache.  Headaches are preventing sleep.  Pt notes over last few days she is needing to blow her nose a lot and has bloody nose each time.  She sees a neurologist next week.  She is currently [redacted]w[redacted]d pregnant.  She has a 5yo daughter.   ? ?PERTINENT HISTORY:  ?Pregnant: [redacted]w[redacted]d at evaluation on 04/11/21 ? ?PAIN:  ?Are you having pain? Yes: yespain: :NPRS scale: 7/10","Pain location: bil temples Pain description: throbbing,Aggravating factors: laying down, sometimes bright lights, Relieving factors: nothing ? ?PRECAUTIONS: Other: pregnant ? ?WEIGHT BEARING RESTRICTIONS No ? ?FALLS:  ?Has patient fallen in last 6 months? No ?Number of falls: 0 ? ?LIVING ENVIRONMENT: ?Lives with: lives  with their family, lives with their partner, and lives with their daughter ?Lives in: House/apartment ?Stairs: Yes; ?Has following equipment at home: None ? ?OCCUPATION: full time, aesthetician ? ?PLOF: Independent ? ?PATIENT GOALS  unsure why I was sent ? ?OBJECTIVE:  ? ?DIAGNOSTIC FINDINGS:  ?Brain MRI 03/2021: No acute intracranial process. No etiology is seen for the patient's headache, neck pain, or right-sided symptoms. ?CT angio chest 03/2021: Markedly age advanced coronary artery atherosclerosis, with calcified plaque in the proximal LAD. Correlate with  ?      risk factors and whether patient's neck and chest symptoms could be attributed to cardiac ischemia. ? ? ?PATIENT SURVEYS:  ?FOTO 67%, goal 76% ? ? ?COGNITION: ?Overall cognitive status: Within functional limits for tasks assessed ? ? ?SENSATION: ?Pt notes fingers fall asleep at night ? ?POSTURE:  ?Reduced thoracic kyphosis, forward head with extension hinge at C6/7 ? ?PALPATION: ?Rt upper trap spasm, Rt SO tender and tight  ? ?CERVICAL ROM:  ? ?Active ROM A/PROM (deg) ?04/11/2021  ?Flexion 55  ?Extension 60  ?Right lateral flexion 45  ?Left lateral flexion 35, Rt neck pain  ?Right rotation 80, Rt neck pain  ?Left rotation 90  ? (Blank rows = not tested) ? ?UE ROM: ? ?WNL bil  ? ?UE MMT: ?5/5 bil, cervical isometrics strong and painless ? ? ?TODAY'S TREATMENT:  ?04/11/21 evaluation: ?DN info  ?Rt Upper trap stretch, backward shoulder rolls x 10, scapular retraction x10 ? ?Check all possible CPT codes: 67124-  Therapeutic Exercise    ? ?If treatment provided at initial evaluation, no treatment charged due to lack of authorization.    ? ? ? ? ?PATIENT EDUCATION:  ?Education details: Access Code: [email or web address] ?Person educated: Patient ?Education method: Explanation, Verbal cues, and Handouts ?Education comprehension: verbalized understanding and returned demonstration ? ? ?HOME EXERCISE PROGRAM: ?Access Code: [email or web address] ?URL: [email or web address] ?Date: 04/11/2021 ?Prepared by: Loistine Simas Renny Gunnarson ? ?Exercises ?Seated Cervical Sidebending Stretch - 2 x daily - 7 x weekly - 1 sets - 2 reps - 20-30 sec hold ?Seated Scapular Retraction - 2 x daily - 7 x weekly - 2 sets - 10 reps ?Standing Backward Shoulder Rolls - 2 x daily - 7 x weekly - 2 sets - 10 reps ? ?Trigger Point Dry Needling ? ?What is Trigger Point Dry Needling (DN)? ?DN is a physical therapy technique used to treat muscle pain and dysfunction. Specifically, DN helps deactivate muscle trigger points (muscle knots).  ?A thin filiform needle is used to penetrate the skin and stimulate the underlying trigger point. The goal is for a local twitch response (LTR) to occur and for the trigger point to relax. No medication of any kind is injected during the procedure.  ? ?What Does Trigger Point Dry Needling Feel Like?  ?The procedure feels different for each individual patient. Some patients report that they do not actually feel the needle enter the skin and overall the process is not painful. Very mild bleeding may occur. However, many patients feel a deep cramping in the muscle in which the needle was inserted. This is the local twitch response.  ? ?How Will I feel after the treatment? ?Soreness is normal, and the onset of soreness may not occur for a few hours. Typically this soreness does not last longer than two days.  ?Bruising is uncommon, however; ice can be used to decrease any possible bruising.  ?In rare cases feeling tired or nauseous after the treatment  is normal. In addition, your symptoms may get  worse before they get better, this period will typically not last longer than 24 hours.  ? ?What Can I do After My Treatment? ?Increase your hydration by drinking more water for the next 24 hours. ?You may place ice or heat on the areas treated that have become sore, however, do not use heat on inflamed or bruised areas. Heat often brings more relief post needling. ?You can continue your regular activities, but vigorous activity is not recommended initially after the treatment for 24 hours. ?DN is best combined with other physical therapy such as strengthening, stretching, and other therapies. ? ?Product/process development scientistBrassfield Specialty Rehab  ?[address] ?[place] [address].  ?[phone]  ? ? ?ASSESSMENT: ? ?CLINICAL IMPRESSION: ?Patient is a 31 y.o. female who was seen today for physical therapy evaluation and treatment for headache and neck pain.  Headaches are temporal and severe bil, worse with lying down and turning head.  She was evaluated and discharged from hospital for her symptoms earlier this month.  She has limited and painful Lt cervical SB and Rt rotation, consistent with Rt upper trap painful spasm.  She is tender to palpation on Rt upper trap and Rt SO.  PT initiated HEP and gave DN info.  She is currently 883w6d pregnant.   ? ? ?OBJECTIVE IMPAIRMENTS decreased ROM, increased muscle spasms, impaired flexibility, postural dysfunction, and pain.  ? ?ACTIVITY LIMITATIONS cleaning, community activity, driving, and shopping.  ? ?PERSONAL FACTORS Time since onset of injury/illness/exacerbation are also affecting patient's functional outcome.  ? ? ?REHAB POTENTIAL: Good ? ?CLINICAL DECISION MAKING: Stable/uncomplicated ? ?EVALUATION COMPLEXITY: Low ? ? ?GOALS: ?Goals reviewed with patient? Yes ? ?SHORT TERM GOALS: Target date: 04/25/2021 ? ?Pt will be ind with initial HEP without exacerbation of pain. ?Baseline:  ?Goal status: INITIAL ? ?2. ? ?LONG TERM GOALS:  Target date: 05/09/2021 ? ?Pt will be ind with advanced HEP and understand how to safely progress ?Baseline:  ?Goal status: INITIAL ? ?2.  Pt will demo symmetry in cervical rotation and SB without pain to

## 2021-04-17 NOTE — Progress Notes (Signed)
? ?NEUROLOGY CONSULTATION NOTE ? ?[name] ?MRN: [id number] ?DOB: [birth year] ? ?Referring provider: Gaylan Gerold, CNM ?Primary care provider: No PCP ? ?Reason for consult:  headache ? ?Assessment/Plan:  ? ?Probable migraine with aura, without status migrainosus, intractable. ?Pregnant at [redacted] weeks gestation ?Probable bilateral carpal tunnel syndrome ? ?1  Continue magnesium oxide 465m daily ?2  Tylenol as needed. ?3  physical therapy for neck pain ?4  Try wearing wrist splints at night to see if numbness in hands resolve. ?5  Follow up as needed. ? ? ?Subjective:  ?Lisa ASLESONis a 31year old right-handed female at 247weeks gestation who presents for headache.  History supplemented by OBGYN notes. ? ? ?Patient is currently pregnant at [redacted] weeks gestation.  On 04/02/2021 she developed 9/10 right sided pounding headache with associated right sided neck pain and dizziness.  She has no prior history of headaches.  [facility]  In the hospital she endorsed right sided facial and right upper and lower extremity numbness and weakness.  MRI without contrast and MRV of brain personally reviewed were normal.  She was treated with IV magnesium and discharged on cyclobenzaprine and oxycodone which she only took briefly.  She started doing neck stretches and started taking magnesium oxide 4036mdaily.  She has not had a headache since then.  She denies prior history of headache.  She had no complications with her first pregnancy.  In bed, she may wake up with numbness and tingling in the hands. ? ? ?PAST MEDICAL HISTORY: ?Past Medical History:  ?Diagnosis Date  ? Asthma   ? ? ?PAST SURGICAL HISTORY: ?Past Surgical History:  ?Procedure Laterality Date  ? NO PAST SURGERIES    ? ? ?MEDICATIONS: ?Current Outpatient Medications on File Prior to Visit  ?Medication Sig Dispense Refill  ? acetaminophen (TYLENOL) 325 MG tablet Take 2 tablets (650 mg total) by mouth every 6  (six) hours as needed. 60 tablet 0  ? Blood Pressure Monitoring (BLOOD PRESSURE KIT) DEVI 1 Device by Does not apply route once a week. 1 each 0  ? cyclobenzaprine (FLEXERIL) 10 MG tablet Take 1 tablet (10 mg total) by mouth 3 (three) times daily as needed (headache). 30 tablet 0  ? Magnesium 400 MG TABS Take 400 mg by mouth daily. 90 tablet 2  ? Magnesium Oxide -Mg Supplement 400 MG CAPS Take 400 mg by mouth daily. Increase to twice daily if symptoms persists. (Patient not taking: Reported on 03/15/2021) 60 capsule 1  ? metoCLOPramide (REGLAN) 10 MG tablet Take 1 tablet (10 mg total) by mouth every 8 (eight) hours as needed for nausea (or headache). 30 tablet 0  ? Prenatal Vit-Fe Fumarate-FA (PREPLUS) 27-1 MG TABS Take 1 tablet by mouth daily. 30 tablet 13  ? ?No current facility-administered medications on file prior to visit.  ? ? ?ALLERGIES: ?No Known Allergies ? ?FAMILY HISTORY: ?Family History  ?Problem Relation Age of Onset  ? Diabetes Mother   ? Diabetes Father   ? Kidney disease Father   ? Hypertension Father   ? Liver disease Father   ? Cancer Maternal Grandmother   ? Diabetes Maternal Grandmother   ? Heart disease Maternal Grandmother   ? Diabetes Maternal Grandfather   ? ? ?Objective:  ?Blood pressure 116/79, pulse 84, resp. rate 18, height _0  (1.803 m), weight 278 lb (126.1 kg), last menstrual period 11/02/2020, SpO2 96 %. ?General: No acute distress.  Patient appears well-groomed.   ?Head:  Normocephalic/atraumatic ?Eyes:  fundi examined but not visualized ?Neck: supple, no paraspinal tenderness, full range of motion ?Back: No paraspinal tenderness ?Heart: regular rate and rhythm ?Lungs: Clear to auscultation bilaterally. ?Vascular: No carotid bruits. ?Neurological Exam: ?Mental status: alert and oriented to person, place, and time, recent and remote memory intact, fund of knowledge intact, attention and concentration intact, speech fluent and not dysarthric, language intact. ?Cranial nerves: ?CN I:  not tested ?CN II: pupils equal, round and reactive to light, visual fields intact ?CN III, IV, VI:  full range of motion, no nystagmus, no ptosis ?CN V: facial sensation intact. ?CN VII: upper and lower face symmetric ?CN VIII: hearing intact ?CN IX, X: gag intact, uvula midline ?CN XI: sternocleidomastoid and trapezius muscles intact ?CN XII: tongue midline ?Bulk & Tone: normal, no fasciculations. ?Motor:  muscle strength 5/5 throughout ?Sensation:  Pinprick, temperature and vibratory sensation intact. ?Deep Tendon Reflexes:  2+ throughout,  toes downgoing.   ?Finger to nose testing:  Without dysmetria.   ?Heel to shin:  Without dysmetria.   ?Gait:  Normal station and stride.  Romberg negative. ? ? ? ?Thank you for allowing me to take part in the care of this patient. ? ?Metta Clines, DO ? ?CC: Gaylan Gerold, CNM ? ? ? ? ?

## 2021-04-17 NOTE — Therapy (Incomplete)
?OUTPATIENT PHYSICAL THERAPY TREATMENT NOTE ? ? ?Patient Name: Lisa Brown ?MRN: [id number] ?DOB:July 30, 1990, 31 y.o., female ?Today's Date: 04/17/2021 ? ?PCP: Patient, No Pcp Per (Inactive) ?REFERRING PROVIDER: Warner Mccreedy, MD ? ? ? ?Past Medical History:  ?Diagnosis Date  ? Asthma   ? ?Past Surgical History:  ?Procedure Laterality Date  ? NO PAST SURGERIES    ? ?Patient Active Problem List  ? Diagnosis Date Noted  ? ASCUS of cervix with negative high risk HPV 01/10/2021  ? Supervision of other normal pregnancy, antepartum 12/28/2020  ? BMI 36.0-36.9,adult 10/06/2015  ? Marijuana use 10/06/2015  ? ? ?REFERRING DIAG: *** ? ?THERAPY DIAG:  ?No diagnosis found. ? ?PERTINENT HISTORY: Pt is pregnant  ? ?PRECAUTIONS: pt is pregnant  ? ?SUBJECTIVE: *** ? ?PAIN:  ?Are you having pain? Yes: NPRS scale: ***/10 ?Pain location: *** ?Pain description: *** ?Aggravating factors: *** ?Relieving factors: *** ? ?OBJECTIVE: from evaluation on 04/11/21 ?  ?DIAGNOSTIC FINDINGS:  ?Brain MRI 03/2021: No acute intracranial process. No etiology is seen for the patient's headache, neck pain, or right-sided symptoms. ?CT angio chest 03/2021: Markedly age advanced coronary artery atherosclerosis, with calcified plaque in the proximal LAD. Correlate with  ?      risk factors and whether patient's neck and chest symptoms could be attributed to cardiac ischemia. ?  ?  ?PATIENT SURVEYS:  ?FOTO 67%, goal 76% ?  ?  ?COGNITION: ?Overall cognitive status: Within functional limits for tasks assessed ?  ?  ?SENSATION: ?Pt notes fingers fall asleep at night ?  ?POSTURE:  ?Reduced thoracic kyphosis, forward head with extension hinge at C6/7 ?  ?PALPATION: ?Rt upper trap spasm, Rt SO tender and tight       ?  ?CERVICAL ROM:  ?  ?Active ROM A/PROM (deg) ?04/11/2021  ?Flexion 55  ?Extension 60  ?Right lateral flexion 45  ?Left lateral flexion 35, Rt neck pain  ?Right rotation 80, Rt neck pain  ?Left rotation 90  ? (Blank rows = not tested) ?  ?UE ROM: ?  ?WNL bil   ?  ?UE MMT: ?5/5 bil, cervical isometrics strong and painless ?  ?  ?TODAY'S TREATMENT:  ?04/11/21 evaluation: ?DN info  ?Rt Upper trap stretch, backward shoulder rolls x 10, scapular retraction x10 ?  ?Check all possible CPT codes: 32992- Therapeutic Exercise                         ?  ?If treatment provided at initial evaluation, no treatment charged due to lack of authorization.                  ?  ?  ?  ?  ?PATIENT EDUCATION:  ?Education details: Access Code: [email or web address] ?Person educated: Patient ?Education method: Explanation, Verbal cues, and Handouts ?Education comprehension: verbalized understanding and returned demonstration ?  ?  ?HOME EXERCISE PROGRAM: ?Access Code: [email or web address] ?URL: [email or web address] ?Date: 04/11/2021 ?Prepared by: Loistine Simas Beuhring ?  ?Exercises ?Seated Cervical Sidebending Stretch - 2 x daily - 7 x weekly - 1 sets - 2 reps - 20-30 sec hold ?Seated Scapular Retraction - 2 x daily - 7 x weekly - 2 sets - 10 reps ?Standing Backward Shoulder Rolls - 2 x daily - 7 x weekly - 2 sets - 10 reps ?  ?  ?ASSESSMENT: ?  ?CLINICAL IMPRESSION: ?Patient is a 31 y.o. female who was seen today for physical therapy evaluation and treatment for headache and neck pain.  Headaches  are temporal and severe bil, worse with lying down and turning head.  She was evaluated and discharged from hospital for her symptoms earlier this month.  She has limited and painful Lt cervical SB and Rt rotation, consistent with Rt upper trap painful spasm.  She is tender to palpation on Rt upper trap and Rt SO.  PT initiated HEP and gave DN info.  She is currently [redacted]w[redacted]d pregnant.   ?  ?  ?OBJECTIVE IMPAIRMENTS decreased ROM, increased muscle spasms, impaired flexibility, postural dysfunction, and pain.  ?  ?ACTIVITY LIMITATIONS cleaning, community activity, driving, and shopping.  ?  ?PERSONAL FACTORS Time since onset of injury/illness/exacerbation are also affecting patient's functional outcome.  ?  ?  ?REHAB  POTENTIAL: Good ?  ?CLINICAL DECISION MAKING: Stable/uncomplicated ?  ?EVALUATION COMPLEXITY: Low ?  ?  ?GOALS: ?Goals reviewed with patient? Yes ?  ?SHORT TERM GOALS: Target date: 04/25/2021 ?  ?Pt will be ind with initial HEP without exacerbation of pain. ?Baseline:  ?Goal status: INITIAL ?  ?2. ?  ?LONG TERM GOALS: Target date: 05/09/2021 ?  ?Pt will be ind with advanced HEP and understand how to safely progress ?Baseline:  ?Goal status: INITIAL ?  ?2.  Pt will demo symmetry in cervical rotation and SB without pain to allow for full ROM for functional activities ?Baseline:  ?Goal status: INITIAL ?  ?3.  Pt will report improved sleep by at least 70% ?Baseline: sleeps 3 hours ?Goal status: INITIAL ?  ?4.  Pt will report reduced severity of headache by at least 70% ?Baseline:  ?Goal status: INITIAL ?  ?5.  Pt will improve FOTO score by at least 9 points to 76% to demo improved function. ?Baseline: 67% ?Goal status: INITIAL ?  ?  ?  ?  ?PLAN: ?PT FREQUENCY: 1-2x/week ?  ?PT DURATION: 4 weeks ?  ?PLANNED INTERVENTIONS: Therapeutic exercises, Therapeutic activity, Neuromuscular re-education, Balance training, Gait training, Patient/Family education, Joint mobilization, Dry Needling, Electrical stimulation, Spinal mobilization, Cryotherapy, Moist heat, Taping, and Manual therapy ?  ?PLAN FOR NEXT SESSION: DN Rt upper trap and bil SO, STM temporalis, postural strength, ongoing assessment of whether PT helps severe headaches, Pt meeting with neurologist next week ? ? ? ?Cecillia Menees, PT ?04/17/2021, 6:04 PM ? ?   ?

## 2021-04-18 ENCOUNTER — Encounter: Payer: Self-pay | Admitting: Neurology

## 2021-04-18 ENCOUNTER — Ambulatory Visit: Payer: Medicaid Other | Admitting: Neurology

## 2021-04-18 ENCOUNTER — Ambulatory Visit: Payer: Medicaid Other

## 2021-04-18 ENCOUNTER — Other Ambulatory Visit: Payer: Self-pay

## 2021-04-18 VITALS — BP 116/79 | HR 84 | Resp 18 | Ht 71.0 in | Wt 278.0 lb

## 2021-04-18 DIAGNOSIS — G43409 Hemiplegic migraine, not intractable, without status migrainosus: Secondary | ICD-10-CM | POA: Diagnosis not present

## 2021-04-18 DIAGNOSIS — G5603 Carpal tunnel syndrome, bilateral upper limbs: Secondary | ICD-10-CM | POA: Diagnosis not present

## 2021-04-18 DIAGNOSIS — G43119 Migraine with aura, intractable, without status migrainosus: Secondary | ICD-10-CM

## 2021-04-18 DIAGNOSIS — Z3A24 24 weeks gestation of pregnancy: Secondary | ICD-10-CM | POA: Diagnosis not present

## 2021-04-18 NOTE — Patient Instructions (Signed)
Continue magnesium oxide 400mg  daily ?May take Tylenol if needed.  Limit use of pain relievers to no more than 2 days out of week to prevent risk of rebound or medication-overuse headache. ?Physical therapy/stretching/massage of neck ?The numbness in hands may be carpal tunnel syndrome.  Start wearing wrist splints on both wrists at night (can get them at any pharmacy) to see if numbness improves. ?Follow up as needed. ?

## 2021-04-19 NOTE — Therapy (Incomplete)
?OUTPATIENT PHYSICAL THERAPY TREATMENT NOTE ? ? ?Patient Name: Lisa Brown ?MRN: [id number] ?DOB:[birth year], 31 y.o., female ?Today's Date: 04/19/2021 ? ?PCP: Patient, No Pcp Per (Inactive) ?REFERRING PROVIDER: Warner Mccreedyas, Anuka, MD ? ? ? ?Past Medical History:  ?Diagnosis Date  ? Asthma   ? ?Past Surgical History:  ?Procedure Laterality Date  ? NO PAST SURGERIES    ? ?Patient Active Problem List  ? Diagnosis Date Noted  ? ASCUS of cervix with negative high risk HPV 01/10/2021  ? Supervision of other normal pregnancy, antepartum 12/28/2020  ? BMI 36.0-36.9,adult 10/06/2015  ? Marijuana use 10/06/2015  ? ? ?REFERRING DIAG: M54.2 (ICD-10-CM) - Neck pain G44.209 (ICD-10-CM) - Tension headache  ? ?THERAPY DIAG:  ?Cramp and spasm ?  ?Abnormal posture ?  ? ?PERTINENT HISTORY: Pregnant: 4085w6d at evaluation on 04/11/21 ? ?PRECAUTIONS: pregnant ? ?SUBJECTIVE: *** ? ?PAIN:  ?Are you having pain? PAIN:  ?Are you having pain? {yes/no:20286} ?NPRS scale: ***/10 ?Pain location: *** ?Pain orientation: {Pain Orientation:25161}  ?PAIN TYPE: {type:313116} ?Pain description: {PAIN DESCRIPTION:21022940}  ?Aggravating factors: *** ?Relieving factors: ***  ? ? ? ?OBJECTIVE:  ?  ?DIAGNOSTIC FINDINGS:  ?Brain MRI 03/2021: No acute intracranial process. No etiology is seen for the patient's headache, neck pain, or right-sided symptoms. ?CT angio chest 03/2021: Markedly age advanced coronary artery atherosclerosis, with calcified plaque in the proximal LAD. Correlate with  ?      risk factors and whether patient's neck and chest symptoms could be attributed to cardiac ischemia. ?  ?  ?PATIENT SURVEYS:  ?FOTO 67%, goal 76% ?  ?  ?COGNITION: ?Overall cognitive status: Within functional limits for tasks assessed ?  ?  ?SENSATION: ?Pt notes fingers fall asleep at night ?  ?POSTURE:  ?Reduced thoracic kyphosis, forward head with extension hinge at C6/7 ?  ?PALPATION: ?Rt upper trap spasm, Rt SO tender and tight       ?  ?CERVICAL ROM:  ?  ?Active ROM A/PROM  (deg) ?04/11/2021  ?Flexion 55  ?Extension 60  ?Right lateral flexion 45  ?Left lateral flexion 35, Rt neck pain  ?Right rotation 80, Rt neck pain  ?Left rotation 90  ? (Blank rows = not tested) ?  ?UE ROM: ?  ?WNL bil  ?  ?UE MMT: ?5/5 bil, cervical isometrics strong and painless ?  ?  ?TODAY'S TREATMENT:  ? ?04/11/21 evaluation: ?DN info  ?Rt Upper trap stretch, backward shoulder rolls x 10, scapular retraction x10 ?  ?Check all possible CPT codes: 1324497110- Therapeutic Exercise                         ?  ?If treatment provided at initial evaluation, no treatment charged due to lack of authorization.                  ?  ?  ?  ?  ?PATIENT EDUCATION:  ?Education details: Access Code: [email or web address] ?Person educated: Patient ?Education method: Explanation, Verbal cues, and Handouts ?Education comprehension: verbalized understanding and returned demonstration ?  ?  ?HOME EXERCISE PROGRAM: ?Access Code: [email or web address] ?URL: [email or web address] ?Date: 04/11/2021 ?Prepared by: Loistine SimasJohanna Beuhring ?  ?Exercises ?Seated Cervical Sidebending Stretch - 2 x daily - 7 x weekly - 1 sets - 2 reps - 20-30 sec hold ?Seated Scapular Retraction - 2 x daily - 7 x weekly - 2 sets - 10 reps ?Standing Backward Shoulder Rolls - 2 x daily - 7 x weekly - 2 sets - 10 reps ?  ?  Trigger Point Dry Needling ?  ?What is Trigger Point Dry Needling (DN)? ?DN is a physical therapy technique used to treat muscle pain and dysfunction. Specifically, DN helps deactivate muscle trigger points (muscle knots).  ?A thin filiform needle is used to penetrate the skin and stimulate the underlying trigger point. The goal is for a local twitch response (LTR) to occur and for the trigger point to relax. No medication of any kind is injected during the procedure.  ?  ?What Does Trigger Point Dry Needling Feel Like?  ?The procedure feels different for each individual patient. Some patients report that they do not actually feel the needle enter the skin and overall the  process is not painful. Very mild bleeding may occur. However, many patients feel a deep cramping in the muscle in which the needle was inserted. This is the local twitch response.  ?  ?How Will I feel after the treatment? ?Soreness is normal, and the onset of soreness may not occur for a few hours. Typically this soreness does not last longer than two days.  ?Bruising is uncommon, however; ice can be used to decrease any possible bruising.  ?In rare cases feeling tired or nauseous after the treatment is normal. In addition, your symptoms may get worse before they get better, this period will typically not last longer than 24 hours.  ?  ?What Can I do After My Treatment? ?Increase your hydration by drinking more water for the next 24 hours. ?You may place ice or heat on the areas treated that have become sore, however, do not use heat on inflamed or bruised areas. Heat often brings more relief post needling. ?You can continue your regular activities, but vigorous activity is not recommended initially after the treatment for 24 hours. ?DN is best combined with other physical therapy such as strengthening, stretching, and other therapies. ?  ?[facility]  ?[address] ?[place] [address].  ?[phone]  ?  ?  ?ASSESSMENT: ?  ?CLINICAL IMPRESSION: ?Patient is a 31 y.o. female who was seen today for physical therapy evaluation and treatment for headache and neck pain.  Headaches are temporal and severe bil, worse with lying down and turning head.  She was evaluated and discharged from hospital for her symptoms earlier this month.  She has limited and painful Lt cervical SB and Rt rotation, consistent with Rt upper trap painful spasm.  She is tender to palpation on Rt upper trap and Rt SO.  PT initiated HEP and gave DN info.  She is currently [redacted]w[redacted]d pregnant.   ?  ?  ?OBJECTIVE IMPAIRMENTS decreased ROM, increased muscle spasms, impaired flexibility, postural dysfunction, and pain.  ?   ?ACTIVITY LIMITATIONS cleaning, community activity, driving, and shopping.  ?  ?PERSONAL FACTORS Time since onset of injury/illness/exacerbation are also affecting patient's functional outcome.  ?  ?  ?REHAB POTENTIAL: Good ?  ?CLINICAL DECISION MAKING: Stable/uncomplicated ?  ?EVALUATION COMPLEXITY: Low ?  ?  ?GOALS: ?Goals reviewed with patient? Yes ?  ?SHORT TERM GOALS: Target date: 04/25/2021 ?  ?Pt will be ind with initial HEP without exacerbation of pain. ?Baseline:  ?Goal status: INITIAL ?  ?2. ?  ?LONG TERM GOALS: Target date: 05/09/2021 ?  ?Pt will be ind with advanced HEP and understand how to safely progress ?Baseline:  ?Goal status: INITIAL ?  ?2.  Pt will demo symmetry in cervical rotation and SB without pain to allow for full ROM for functional activities ?Baseline:  ?Goal status:  INITIAL ?  ?3.  Pt will report improved sleep by at least 70% ?Baseline: sleeps 3 hours ?Goal status: INITIAL ?  ?4.  Pt will report reduced severity of headache by at least 70% ?Baseline:  ?Goal status: INITIAL ?  ?5.  Pt will improve FOTO score by at least 9 points to 76% to demo improved function. ?Baseline: 67% ?Goal status: INITIAL ?  ?  ?  ?  ?PLAN: ?PT FREQUENCY: 1-2x/week ?  ?PT DURATION: 4 weeks ?  ?PLANNED INTERVENTIONS: Therapeutic exercises, Therapeutic activity, Neuromuscular re-education, Balance training, Gait training, Patient/Family education, Joint mobilization, Dry Needling, Electrical stimulation, Spinal mobilization, Cryotherapy, Moist heat, Taping, and Manual therapy ?  ?PLAN FOR NEXT SESSION: DN Rt upper trap and bil SO, STM temporalis, postural strength, ongoing assessment of whether PT helps severe headaches, Pt meeting with neurologist next week ?  ?. ?  ? ? ? ? ? ?Vivien Presto, PT ?04/19/2021, 7:00 PM ? ?   ?

## 2021-04-20 ENCOUNTER — Telehealth: Payer: Self-pay

## 2021-04-20 ENCOUNTER — Encounter: Payer: Medicaid Other | Admitting: Physical Therapy

## 2021-04-23 ENCOUNTER — Encounter: Payer: Self-pay | Admitting: *Deleted

## 2021-04-23 ENCOUNTER — Ambulatory Visit: Payer: Medicaid Other | Attending: Obstetrics and Gynecology

## 2021-04-23 ENCOUNTER — Ambulatory Visit: Payer: Medicaid Other | Admitting: *Deleted

## 2021-04-23 ENCOUNTER — Other Ambulatory Visit: Payer: Self-pay | Admitting: *Deleted

## 2021-04-23 VITALS — BP 119/67 | HR 93

## 2021-04-23 DIAGNOSIS — O99212 Obesity complicating pregnancy, second trimester: Secondary | ICD-10-CM

## 2021-04-23 DIAGNOSIS — Z3A24 24 weeks gestation of pregnancy: Secondary | ICD-10-CM | POA: Diagnosis not present

## 2021-04-23 DIAGNOSIS — Z148 Genetic carrier of other disease: Secondary | ICD-10-CM

## 2021-04-23 DIAGNOSIS — Z3480 Encounter for supervision of other normal pregnancy, unspecified trimester: Secondary | ICD-10-CM

## 2021-04-23 DIAGNOSIS — E669 Obesity, unspecified: Secondary | ICD-10-CM

## 2021-04-23 DIAGNOSIS — Z362 Encounter for other antenatal screening follow-up: Secondary | ICD-10-CM

## 2021-04-23 DIAGNOSIS — O285 Abnormal chromosomal and genetic finding on antenatal screening of mother: Secondary | ICD-10-CM

## 2021-04-23 IMAGING — US US MFM OB FOLLOW-UP
1 series · 13 of 28 positions shown · non-contrast
Comparison: none

[Series 1: us mfm ob follow-up · 13 of 86 slices shown]
[im 4/86]
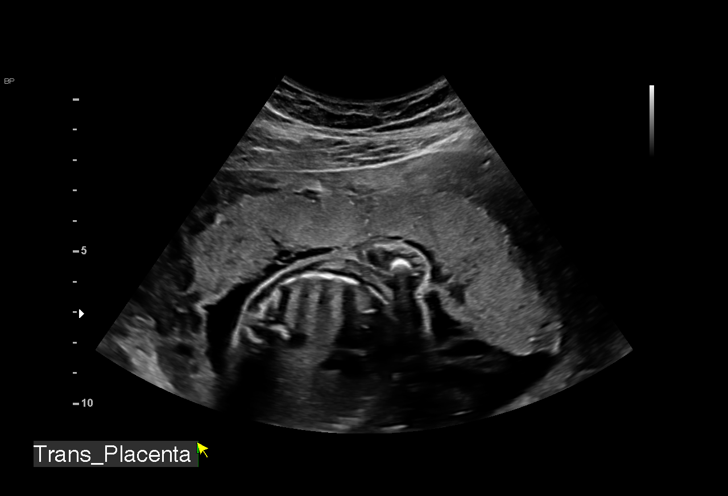
[im 10/86]
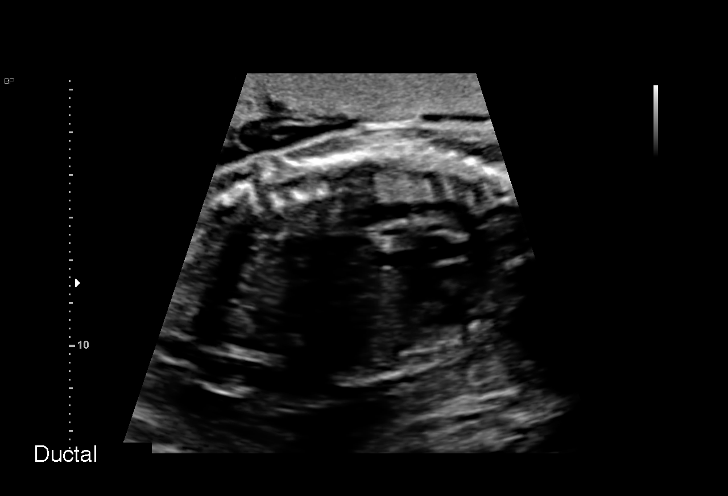
[im 16/86]
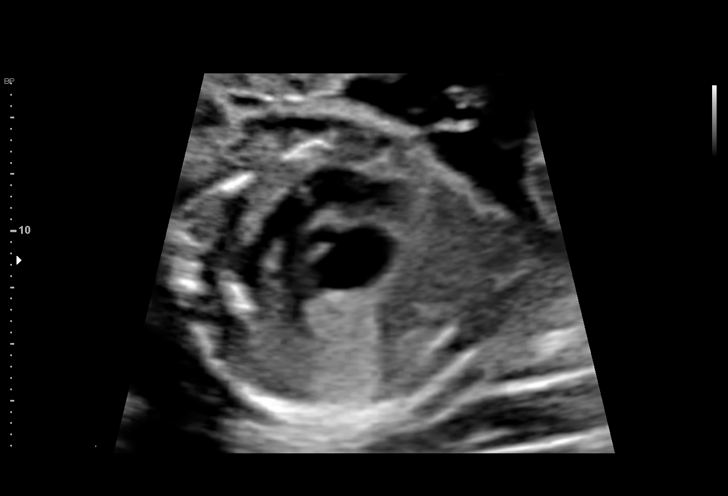
[im 23/86]
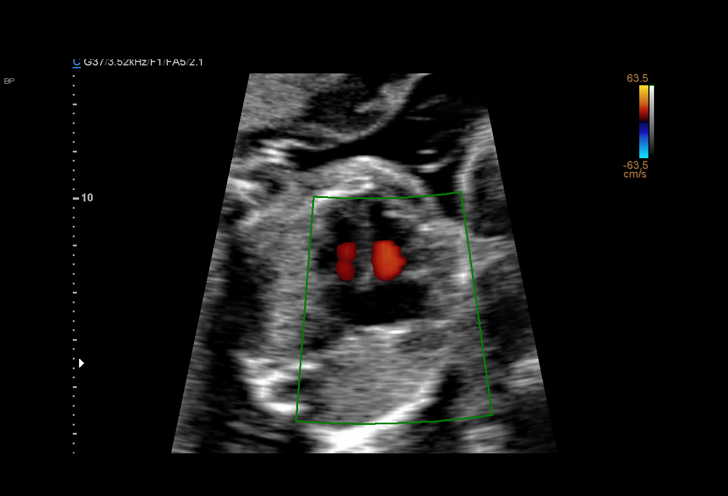
[im 29/86]
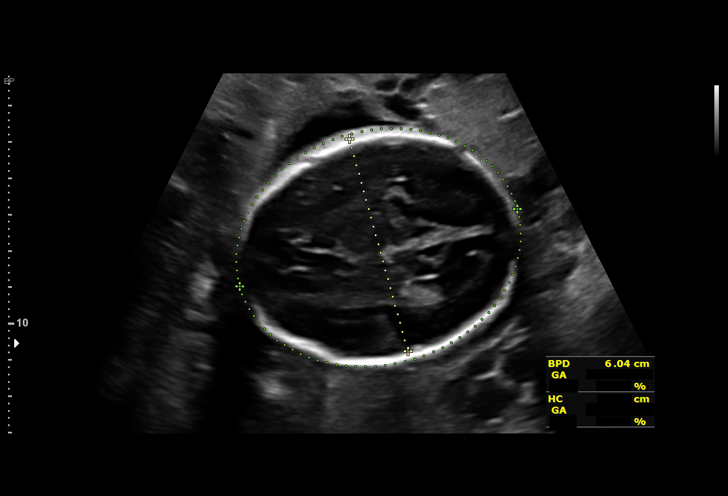
[im 35/86]
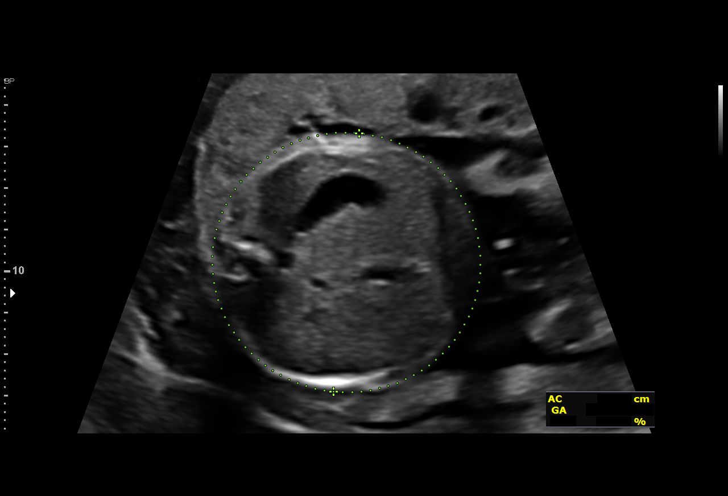
[im 45/86]
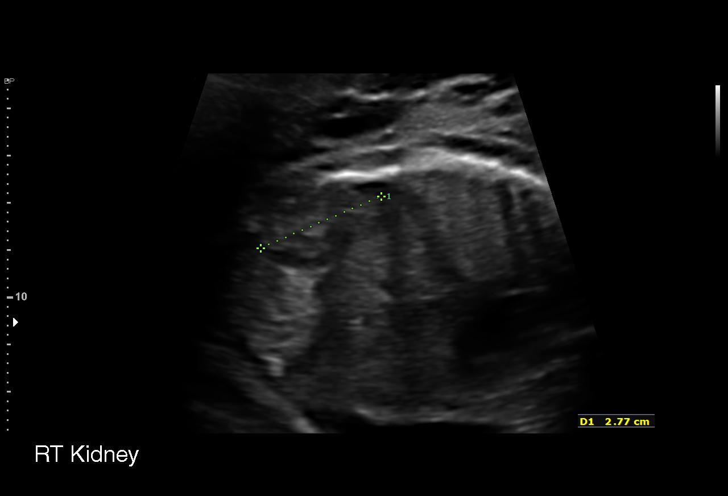
[im 51/86]
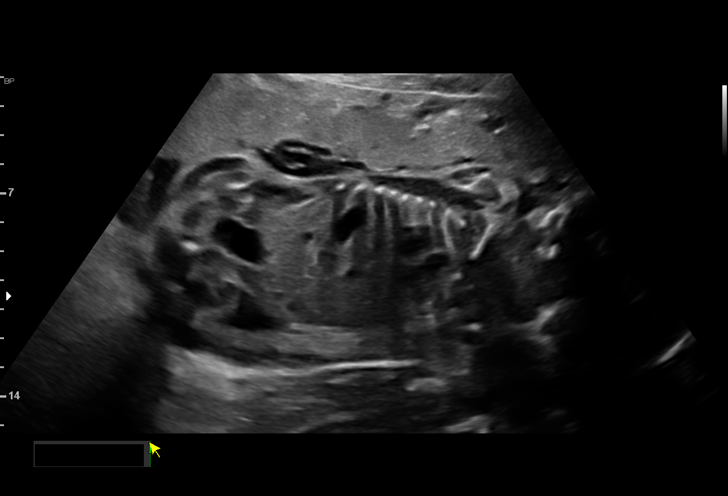
[im 57/86]
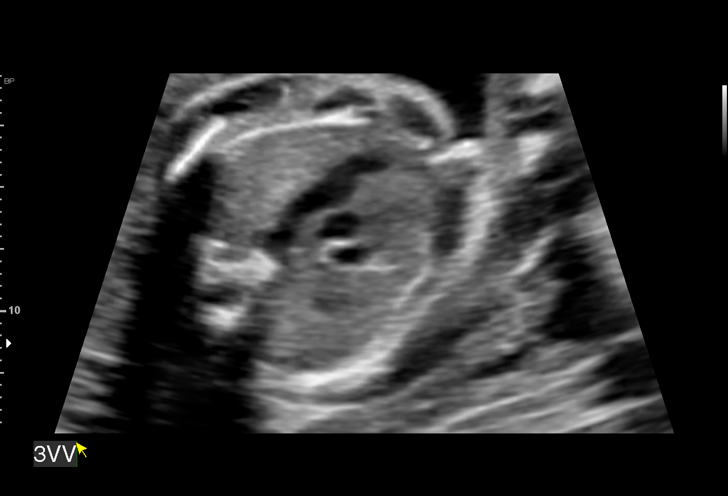
[im 63/86]
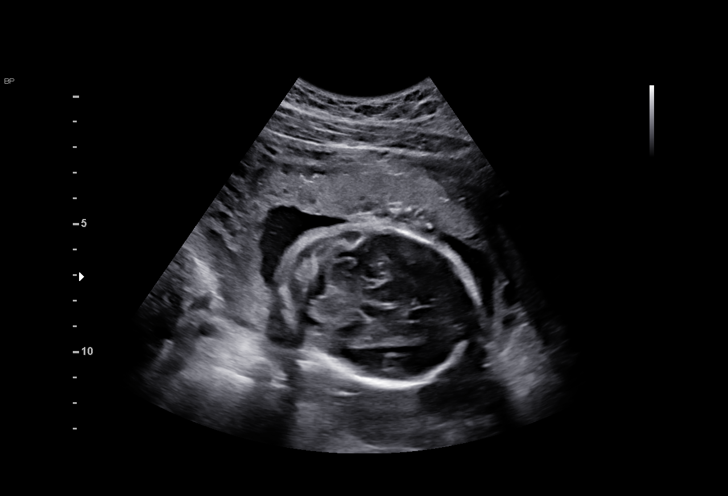
[im 70/86]
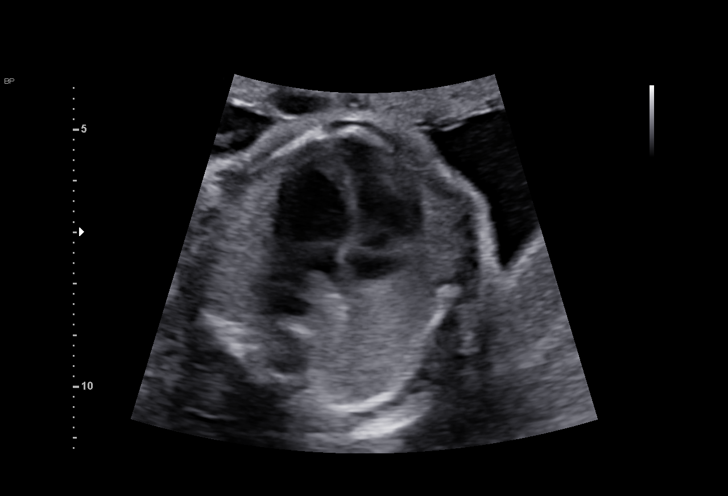
[im 76/86]
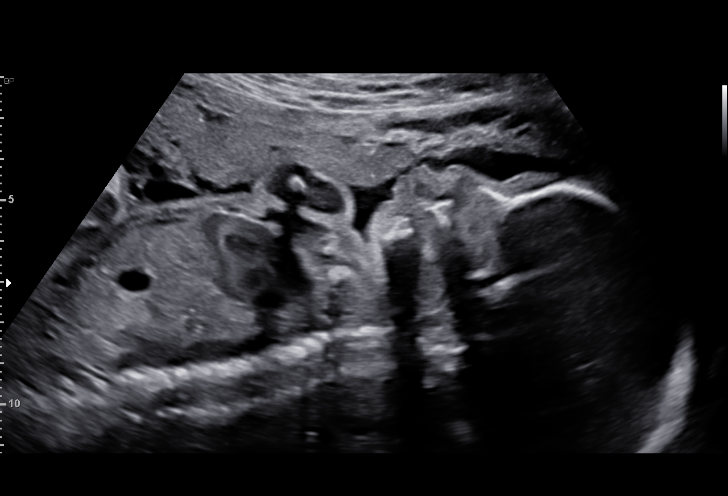
[im 82/86]
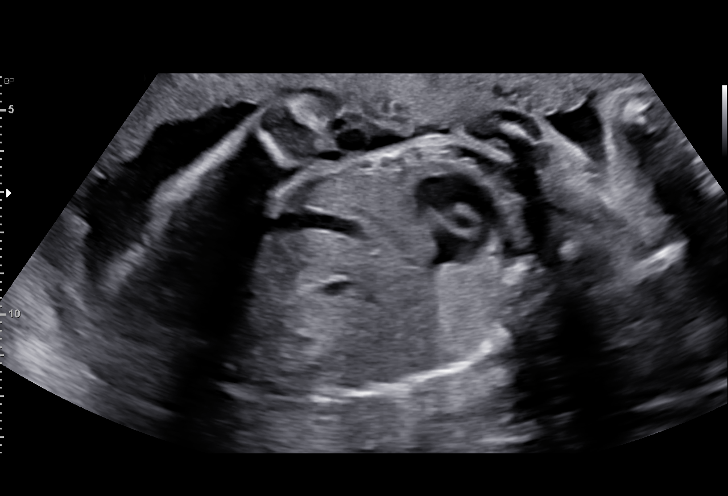

[13 of 28 positions shown; findings below may reference images not displayed]

Indications

 Obesity complicating pregnancy, second         [5F]
 trimester (pregravid BMI 36)
 Low risk NIPS/neg AFP
 Genetic carrier (increased risk for SMA        [5F]
 carrier)
 24 weeks gestation of pregnancy
Fetal Evaluation

 Num Of Fetuses:         1
 Fetal Heart Rate(bpm):  136
 Cardiac Activity:       Observed
 Presentation:           Cephalic
 Placenta:               Anterior Fundal
 P. Cord Insertion:      Previously Visualized

 Amniotic Fluid
 AFI FV:      Within normal limits

                             Largest Pocket(cm)

Biometry

 BPD:      60.9  mm     G. Age:  24w 5d         50  %    CI:        72.22   %    70 - 86
                                                         FL/HC:      18.8   %    18.7 -
 HC:       228   mm     G. Age:  24w 6d         39  %    HC/AC:      1.17        1.04 -
 AC:      195.4  mm     G. Age:  24w 2d         30  %    FL/BPD:     70.3   %    71 - 87
 FL:       42.8  mm     G. Age:  24w 0d         20  %    FL/AC:      21.9   %    20 - 24
 HUM:      41.2  mm     G. Age:  25w 0d         51  %

 LV:        5.6  mm
 Est. FW:     674  gm      1 lb 8 oz     26  %
OB History

 Gravidity:    5         Term:   1        Prem:   0        SAB:   1
 TOP:          2       Ectopic:  0        Living: 1
Gestational Age

 LMP:           24w 4d        Date:  [DATE]                 EDD:   [DATE]
 U/S Today:     24w 3d                                        EDD:   [DATE]
 Best:          24w 4d     Det. By:  LMP  ([DATE])          EDD:   [DATE]
Anatomy

 Cranium:               Appears normal         Aortic Arch:            Appears normal
 Cavum:                 Appears normal         Ductal Arch:            Appears normal
 Ventricles:            Appears normal         Diaphragm:              Appears normal
 Choroid Plexus:        Previously seen        Stomach:                Appears normal, left
                                                                       sided
 Cerebellum:            Previously seen        Abdomen:                Previously seen
 Posterior Fossa:       Previously seen        Abdominal Wall:         Previously seen
 Nuchal Fold:           Previously seen        Cord Vessels:           Appears normal (3
                                                                       vessel cord)
 Face:                  Orbits and profile     Kidneys:                Appear normal
                        previously seen
 Lips:                  Previously seen        Bladder:                Appears normal
 Thoracic:              Appears normal         Spine:                  Previously seen
 Heart:                 Appears normal         Upper Extremities:      Previously seen
                        (4CH, axis, and
                        situs)
 RVOT:                  Appears normal         Lower Extremities:      Previously seen
 LVOT:                  Appears normal

 Other:  Fetus appears to be a male. VC, 3VV and 3VTV visualized. 3VV,
         nasal bone, maxilla, mandible, heels/feet and open hands/5th digits
         previously visualized.
Cervix Uterus Adnexa

 Cervix
 Length:           3.15  cm.
 Normal appearance by transabdominal scan.

 Uterus
 Normal shape and size.

 Right Ovary
 Not visualized.

 Left Ovary
 Within normal limits.
Comments

 This patient was seen for a follow up exam as the views of
 the fetal anatomy were unable to be fully visualized during
 her last exam.  Her pregnancy has also been complicated by
 maternal obesity with a BMI of 37.  She denies any problems
 since her last exam.
 She was informed that the fetal growth and amniotic fluid
 level appears appropriate for her gestational age.
 The views of the fetal anatomy were visualized today.  There
 were no obvious anomalies noted.
 The limitations of ultrasound in the detection of all anomalies
 was discussed.
 Due to maternal obesity, a follow-up growth scan was
 scheduled in 6 weeks.

## 2021-04-25 ENCOUNTER — Ambulatory Visit: Payer: Medicaid Other | Attending: Family Medicine

## 2021-04-25 DIAGNOSIS — R252 Cramp and spasm: Secondary | ICD-10-CM | POA: Diagnosis present

## 2021-04-25 DIAGNOSIS — R293 Abnormal posture: Secondary | ICD-10-CM | POA: Diagnosis present

## 2021-04-25 NOTE — Therapy (Signed)
?OUTPATIENT PHYSICAL THERAPY TREATMENT NOTE ? ? ?Patient Name: Lisa Brown ?MRN: [id number] ?DOB:December 26, 1990, 31 y.o., female ?Today's Date: 04/25/2021 ? ?PCP: Patient, No Pcp Per (Inactive) ?REFERRING PROVIDER: Warner Mccreedy, MD ? ?END OF SESSION:  ? PT End of Session - 04/25/21 1655   ? ? Visit Number 2   ? Date for PT Re-Evaluation 05/09/21   ? Authorization Type [employer]   ? Authorization Time Period requesting 1x/week x 4 weeks 04-11-21 to 05-09-21   ? PT Start Time 1619   ? PT Stop Time 1651   ? PT Time Calculation (min) 32 min   ? Activity Tolerance Patient tolerated treatment well   ? Behavior During Therapy [facility] for tasks assessed/performed   ? ?  ?  ? ?  ? ? ?Past Medical History:  ?Diagnosis Date  ? Asthma   ? ?Past Surgical History:  ?Procedure Laterality Date  ? NO PAST SURGERIES    ? ?Patient Active Problem List  ? Diagnosis Date Noted  ? ASCUS of cervix with negative high risk HPV 01/10/2021  ? Supervision of other normal pregnancy, antepartum 12/28/2020  ? BMI 36.0-36.9,adult 10/06/2015  ? Marijuana use 10/06/2015  ? ? ?REFERRING DIAG: M54.2 (ICD-10-CM) - Neck pain G44.209 (ICD-10-CM) - Tension headache  ? ? ?THERAPY DIAG:  ?Cramp and spasm ? ?Abnormal posture ? ?PERTINENT HISTORY: Pt is pregnant ? ?PRECAUTIONS: Pt is [redacted] weeks pregnant  ? ?SUBJECTIVE: I haven't had a headache.  I've been taking the magnesium and it is help.   ? ?PAIN:  ?Are you having pain? Yes: NPRS scale: 0/10 ?Pain location: neck and Rt arm ?Pain description: numbness at night with sleep ?Aggravating factors: sleep ?Relieving factors: magnesium ? ?OBJECTIVE:  from evaluation ?  ?DIAGNOSTIC FINDINGS:  ?Brain MRI 03/2021: No acute intracranial process. No etiology is seen for the patient's headache, neck pain, or right-sided symptoms. ?CT angio chest 03/2021: Markedly age advanced coronary artery atherosclerosis, with calcified plaque in the proximal LAD. Correlate with  ?      risk factors and whether patient's neck and chest symptoms  could be attributed to cardiac ischemia. ?  ?  ?PATIENT SURVEYS:  ?FOTO 67%, goal 76% ?  ?  ?COGNITION: ?Overall cognitive status: Within functional limits for tasks assessed ?  ?  ?SENSATION: ?Pt notes fingers fall asleep at night ?  ?POSTURE:  ?Reduced thoracic kyphosis, forward head with extension hinge at C6/7 ?  ?PALPATION: ?Rt upper trap spasm, Rt SO tender and tight       ?  ?CERVICAL ROM:  ?  ?Active ROM A/PROM (deg) ?04/11/2021  ?Flexion 55  ?Extension 60  ?Right lateral flexion 45  ?Left lateral flexion 35, Rt neck pain  ?Right rotation 80, Rt neck pain  ?Left rotation 90  ? (Blank rows = not tested) ?  ?UE ROM: ?  ?WNL bil  ?  ?UE MMT: ?5/5 bil, cervical isometrics strong and painless ?  ?  ?TODAY'S TREATMENT:  ?Treatment on date: 04/25/21 ?Upper trap stretch: 3x20" bil ?Shoulder rolls and scap squeezes ?Red theraband: scap squeezes, ER and shoulder extension 2x10 bil each ?Addaday: Rt>Lt upper trap x 10 min ? ?04/11/21 evaluation: ?DN info  ?Rt Upper trap stretch, backward shoulder rolls x 10, scapular retraction x10 ?  ?Check all possible CPT codes: 92426- Therapeutic Exercise                         ?  ?If treatment provided  at initial evaluation, no treatment charged due to lack of authorization.                  ? ?  ?PATIENT EDUCATION:  ?Education details: Access Code: [email or web address] ?Person educated: Patient ?Education method: Explanation, Verbal cues, and Handouts ?Education comprehension: verbalized understanding and returned demonstration ?  ?  ?HOME EXERCISE PROGRAM: ?Access Code: [email or web address] ?URL: [email or web address] ?Date: 04/25/2021 ?Prepared by: Tresa Endo ? ?Exercises ?- Seated Cervical Sidebending Stretch  - 2 x daily - 7 x weekly - 1 sets - 2 reps - 20-30 sec hold ?- Seated Scapular Retraction  - 2 x daily - 7 x weekly - 2 sets - 10 reps ?- Standing Backward Shoulder Rolls  - 2 x daily - 7 x weekly - 2 sets - 10 reps ?- Scapular Retraction with Resistance  - 2 x daily - 7 x weekly - 2 sets  - 10 reps ?- Shoulder extension with resistance - Neutral  - 2 x daily - 7 x weekly - 2 sets - 10 reps ?- Shoulder External Rotation and Scapular Retraction with Resistance  - 2 x daily - 7 x weekly - 2 sets - 10 reps ? ?ASSESSMENT: ?  ?CLINICAL IMPRESSION: ?First time follow-up after evaluation.  Pt reports that she has not had any neck pain or headaches since she was here last.  Pt has been working on her posture and flexibility exercises.  PT added to HEP for postural strength and pt required minor tactile cues for scapular depression.  Pt declined DN today as she was not having pain or significant tension.  Pt with Rt>Lt tension in upper traps that was not painful and pt responded well to addaday.  PT discussed importance of postural corrections and muscle relaxation at work by using mini breaks and flexibility between clients.  Pt will continue to benefit from skilled PT to address postural strength, flexibility and tissue mobility.   ?  ?  ?OBJECTIVE IMPAIRMENTS decreased ROM, increased muscle spasms, impaired flexibility, postural dysfunction, and pain.  ?  ?ACTIVITY LIMITATIONS cleaning, community activity, driving, and shopping.  ?  ?PERSONAL FACTORS Time since onset of injury/illness/exacerbation are also affecting patient's functional outcome.  ?  ?  ?GOALS: ?Goals reviewed with patient? Yes ?  ?SHORT TERM GOALS: Target date: 04/25/2021 ?  ?Pt will be ind with initial HEP without exacerbation of pain. ?Baseline:  ?Goal status: INITIAL ?  ?2. ?  ?LONG TERM GOALS: Target date: 05/09/2021 ?  ?Pt will be ind with advanced HEP and understand how to safely progress ?Baseline:  ?Goal status: INITIAL ?  ?2.  Pt will demo symmetry in cervical rotation and SB without pain to allow for full ROM for functional activities ?Baseline:  ?Goal status: INITIAL ?  ?3.  Pt will report improved sleep by at least 70% ?Baseline: sleeps 3 hours ?Goal status: INITIAL ?  ?4.  Pt will report reduced severity of headache by at least  70% ?Baseline:  ?Goal status: INITIAL ?  ?5.  Pt will improve FOTO score by at least 9 points to 76% to demo improved function. ?Baseline: 67% ?Goal status: INITIAL ?   ?  ?  ?PLAN: ?PT FREQUENCY: 1-2x/week ?  ?PT DURATION: 4 weeks ?  ?PLANNED INTERVENTIONS: Therapeutic exercises, Therapeutic activity, Neuromuscular re-education, Balance training, Gait training, Patient/Family education, Joint mobilization, Dry Needling, Electrical stimulation, Spinal mobilization, Cryotherapy, Moist heat, Taping, and Manual therapy ?  ?PLAN FOR NEXT SESSION: DN Rt upper trap and bil SO  if pain returns, postural strength  ?  ?  ? ?Sharni Negron, PT ?04/25/2021, 4:56 PM ? ?   ?

## 2021-04-26 ENCOUNTER — Ambulatory Visit (INDEPENDENT_AMBULATORY_CARE_PROVIDER_SITE_OTHER): Payer: Medicaid Other | Admitting: Obstetrics

## 2021-04-26 ENCOUNTER — Encounter: Payer: Medicaid Other | Admitting: Obstetrics and Gynecology

## 2021-04-26 ENCOUNTER — Encounter: Payer: Self-pay | Admitting: Obstetrics

## 2021-04-26 VITALS — BP 127/82 | HR 102

## 2021-04-26 DIAGNOSIS — Z3480 Encounter for supervision of other normal pregnancy, unspecified trimester: Secondary | ICD-10-CM

## 2021-04-26 NOTE — Progress Notes (Signed)
Subjective:  [name] is a 31 y.o. [phone] at [redacted]w[redacted]d being seen today for ongoing prenatal care.  She is currently monitored for the following issues for this low-risk pregnancy and has BMI 36.0-36.9,adult; Marijuana use; Supervision of other normal pregnancy, antepartum; and ASCUS of cervix with negative high risk HPV on their problem list. ? ?Patient reports backache and pelvic pressure .  Contractions: Not present. Vag. Bleeding: None.  Movement: Present. Denies leaking of fluid.  ? ?The following portions of the patient's history were reviewed and updated as appropriate: allergies, current medications, past family history, past medical history, past social history, past surgical history and problem list. Problem list updated. ? ?Objective:  ? ?Vitals:  ? 04/26/21 1537  ?BP: 127/82  ?Pulse: (!) 102  ? ? ?Fetal Status: Fetal Heart Rate (bpm): 141   Movement: Present    ? ?General:  Alert, oriented and cooperative. Patient is in no acute distress.  ?Skin: Skin is warm and dry. No rash noted.   ?Cardiovascular: Normal heart rate noted  ?Respiratory: Normal respiratory effort, no problems with respiration noted  ?Abdomen: Soft, gravid, appropriate for gestational age. Pain/Pressure: Absent     ?Pelvic:  Cervical exam deferred        ?Extremities: Normal range of motion.  Edema: None  ?Mental Status: Normal mood and affect. Normal behavior. Normal judgment and thought content.  ? ?Urinalysis:     ? ?Assessment and Plan:  ?Pregnancy: [id number] at [redacted]w[redacted]d ? ?1. Supervision of other normal pregnancy, antepartum ? ?  ?There are no diagnoses linked to this encounter. ?Preterm labor symptoms and general obstetric precautions including but not limited to vaginal bleeding, contractions, leaking of fluid and fetal movement were reviewed in detail with the patient. ?Please refer to After Visit Summary for other counseling recommendations.  ? ?Return in about 3 weeks (around 05/17/2021) for ROB, 2 hour OGTT. ? ? ?Brock Bad, MD  ?04/26/21  ?

## 2021-04-26 NOTE — Progress Notes (Signed)
Pt presents for ROB at 25 wks.  ?C/O pelvic pressure and requesting support belt.  ?

## 2021-05-02 ENCOUNTER — Ambulatory Visit: Payer: Medicaid Other

## 2021-05-09 ENCOUNTER — Ambulatory Visit: Payer: Medicaid Other | Admitting: Physical Therapy

## 2021-05-09 ENCOUNTER — Encounter: Payer: Self-pay | Admitting: Physical Therapy

## 2021-05-09 DIAGNOSIS — R252 Cramp and spasm: Secondary | ICD-10-CM

## 2021-05-09 DIAGNOSIS — R293 Abnormal posture: Secondary | ICD-10-CM

## 2021-05-09 NOTE — Therapy (Signed)
?OUTPATIENT PHYSICAL THERAPY TREATMENT NOTE ? ? ?Patient Name: Lisa Brown ?MRN: [id number] ?DOB:1990-03-12, 31 y.o., female ?Today's Date: 05/09/2021 ? ?PCP: Patient, No Pcp Per (Inactive) ?REFERRING PROVIDER: Renard Matter, MD ? ?END OF SESSION:  ? PT End of Session - 05/09/21 0953   ? ? Visit Number 3   ? Date for PT Re-Evaluation 05/09/21   ? Authorization Type [employer] [employer]   ? Authorization Time Period requesting 1x/week x 4 weeks 04-11-21 to 05-09-21   ? PT Start Time [phone]   ? ?  ?  ? ?  ? ? ?Past Medical History:  ?Diagnosis Date  ? Asthma   ? ?Past Surgical History:  ?Procedure Laterality Date  ? NO PAST SURGERIES    ? ?Patient Active Problem List  ? Diagnosis Date Noted  ? ASCUS of cervix with negative high risk HPV 01/10/2021  ? Supervision of other normal pregnancy, antepartum 12/28/2020  ? BMI 36.0-36.9,adult 10/06/2015  ? Marijuana use 10/06/2015  ? ? ?REFERRING DIAG: M54.2 (ICD-10-CM) - Neck pain G44.209 (ICD-10-CM) - Tension headache  ? ? ?THERAPY DIAG:  ?Cramp and spasm ? ?Abnormal posture ? ?PERTINENT HISTORY: Pt is pregnant ? ?PRECAUTIONS: Pt is [redacted] weeks pregnant  ? ?SUBJECTIVE: I feel better.  Magnesium, stretches, band exercises and massages are helping.  I can sleep now and don't have headaches or neck pain anymore.  I get tense if I work a lot but no pain and can stretch through that.  ? ?PAIN:  ?Are you having pain? Yes: NPRS scale: 0/10 ?Pain location: neck and Rt arm ?Pain description: numbness at night with sleep ?Aggravating factors: sleep ?Relieving factors: magnesium ? ?OBJECTIVE:  from evaluation ?  ?DIAGNOSTIC FINDINGS:  ?Brain MRI 03/2021: No acute intracranial process. No etiology is seen for the patient's headache, neck pain, or right-sided symptoms. ?CT angio chest 03/2021: Markedly age advanced coronary artery atherosclerosis, with calcified plaque in the proximal LAD. Correlate with  ?      risk factors and whether patient's neck and chest symptoms could be attributed to cardiac  ischemia. ?  ?  ?PATIENT SURVEYS:  ?FOTO 67%, goal 76% ?  ?  ?COGNITION: ?Overall cognitive status: Within functional limits for tasks assessed ?  ?  ?SENSATION: ?Pt notes fingers fall asleep at night ?  ?POSTURE:  ?Reduced thoracic kyphosis, forward head with extension hinge at C6/7 ?  ?PALPATION: ?RE-EVAL 05/09/21: Rt upper trap and SO non-tender to palpation with normal resting tone ? ?Rt upper trap spasm, Rt SO tender and tight       ?  ?CERVICAL ROM:  ?  ?Active ROM A/PROM (deg) ?04/11/2021 A/ROM ?05/09/21  ?Flexion 55 60  ?Extension 60 65  ?Right lateral flexion 45 54  ?Left lateral flexion 35, Rt neck pain 49  ?Right rotation 80, Rt neck pain 85  ?Left rotation 90 90 ?  ? (Blank rows = not tested) ?  ?UE ROM: ?  ?WNL bil  ?  ?UE MMT: ?5/5 bil, cervical isometrics strong and painless ?  ?  ?TODAY'S TREATMENT:  ?Treatment on date: 05/09/21 ?Discussed HEP - Pt without questions ? ?Treatment on date: 04/25/21 ?Upper trap stretch: 3x20" bil ?Shoulder rolls and scap squeezes ?Red theraband: scap squeezes, ER and shoulder extension 2x10 bil each ?Addaday: Rt>Lt upper trap x 10 min ? ?04/11/21 evaluation: ?DN info  ?Rt Upper trap stretch, backward shoulder rolls x 10, scapular retraction x10 ?  ?Check all possible CPT codes: 97110- Therapeutic Exercise                         ?  ?  If treatment provided at initial evaluation, no treatment charged due to lack of authorization.                  ? ?  ?PATIENT EDUCATION:  ?Education details: Access Code: [email or web address] ?Person educated: Patient ?Education method: Explanation, Verbal cues, and Handouts ?Education comprehension: verbalized understanding and returned demonstration ?  ?  ?HOME EXERCISE PROGRAM: ?Access Code: [email or web address] ?URL: [email or web address] ?Date: 04/25/2021 ?Prepared by: Claiborne Billings ? ?Exercises ?- Seated Cervical Sidebending Stretch  - 2 x daily - 7 x weekly - 1 sets - 2 reps - 20-30 sec hold ?- Seated Scapular Retraction  - 2 x daily - 7 x weekly - 2 sets -  10 reps ?- Standing Backward Shoulder Rolls  - 2 x daily - 7 x weekly - 2 sets - 10 reps ?- Scapular Retraction with Resistance  - 2 x daily - 7 x weekly - 2 sets - 10 reps ?- Shoulder extension with resistance - Neutral  - 2 x daily - 7 x weekly - 2 sets - 10 reps ?- Shoulder External Rotation and Scapular Retraction with Resistance  - 2 x daily - 7 x weekly - 2 sets - 10 reps ? ?ASSESSMENT: ?  ?CLINICAL IMPRESSION: ?Pt reports resolution of symptoms.  No more headaches or neck pain and can now sleep comfortably through the night. She gets neck tension vs pain when working a lot but is able to stretch to eliminate tightness.  She has been taking magnesium, getting massages 1x/week and doing HEP from PT.   PT discussed HEP with Pt and she did not have any questions.  D/C to HEP at this time.  She understands she may return with a new PT Rx as needed if symptoms return.  ?  ?  ?OBJECTIVE IMPAIRMENTS decreased ROM, increased muscle spasms, impaired flexibility, postural dysfunction, and pain.  ?  ?ACTIVITY LIMITATIONS cleaning, community activity, driving, and shopping.  ?  ?PERSONAL FACTORS Time since onset of injury/illness/exacerbation are also affecting patient's functional outcome.  ?  ?  ?GOALS: ?Goals reviewed with patient? Yes ?  ?SHORT TERM GOALS: Target date: 04/25/2021 ?  ?Pt will be ind with initial HEP without exacerbation of pain. ?Baseline:  ?Goal status: INITIAL ?  ?2. ?  ?LONG TERM GOALS: Target date: 05/09/2021 ?  ?Pt will be ind with advanced HEP and understand how to safely progress ?Baseline:  ?Goal status: MET ?  ?2.  Pt will demo symmetry in cervical rotation and SB without pain to allow for full ROM for functional activities ?Baseline:  ?Goal status: MET ?  ?3.  Pt will report improved sleep by at least 70% ?Baseline: sleeps 3 hours ?Goal status: MET ?  ?4.  Pt will report reduced severity of headache by at least 70% ?Baseline:  ?Goal status: MET ?  ?5.  Pt will improve FOTO score by at least 9  points to 76% to demo improved function. ?Baseline: ?Goal status: DEFERRED ?   ?  ?  ?PLAN: ?PT FREQUENCY: 1-2x/week ?  ?PT DURATION: 4 weeks ?  ?PLANNED INTERVENTIONS: Therapeutic exercises, Therapeutic activity, Neuromuscular re-education, Balance training, Gait training, Patient/Family education, Joint mobilization, Dry Needling, Electrical stimulation, Spinal mobilization, Cryotherapy, Moist heat, Taping, and Manual therapy ?  ?PLAN FOR NEXT SESSION: DN Rt upper trap and bil SO if pain returns, postural strength  ?  ?  ? ?PHYSICAL THERAPY DISCHARGE SUMMARY ? ?Visits from Start of Care: 3 ? ?Current functional level related to goals /  functional outcomes: ?Pt reports symtpoms have resolved.  Has full ROM and no spasm present.  Sleeping through night now. ?  ?Remaining deficits: ?none ?  ?Education / Equipment: ?HEP  ? ?Patient agrees to discharge. Patient goals were met. Patient is being discharged due to meeting the stated rehab goals. ? ?Baruch Merl, PT ?05/09/21 10:05 AM ? ? ? ?   ?

## 2021-05-10 ENCOUNTER — Inpatient Hospital Stay (HOSPITAL_COMMUNITY)
Admission: AD | Admit: 2021-05-10 | Discharge: 2021-05-10 | Disposition: A | Discharge status: Home / Self Care | Payer: Medicaid Other | Attending: Obstetrics and Gynecology | Admitting: Obstetrics and Gynecology

## 2021-05-10 ENCOUNTER — Encounter (HOSPITAL_COMMUNITY): Payer: Self-pay | Admitting: Obstetrics and Gynecology

## 2021-05-10 DIAGNOSIS — Z3A27 27 weeks gestation of pregnancy: Secondary | ICD-10-CM | POA: Insufficient documentation

## 2021-05-10 DIAGNOSIS — N760 Acute vaginitis: Secondary | ICD-10-CM

## 2021-05-10 DIAGNOSIS — Z0371 Encounter for suspected problem with amniotic cavity and membrane ruled out: Secondary | ICD-10-CM | POA: Insufficient documentation

## 2021-05-10 DIAGNOSIS — Z3480 Encounter for supervision of other normal pregnancy, unspecified trimester: Secondary | ICD-10-CM

## 2021-05-10 DIAGNOSIS — O26892 Other specified pregnancy related conditions, second trimester: Secondary | ICD-10-CM | POA: Diagnosis present

## 2021-05-10 DIAGNOSIS — O23592 Infection of other part of genital tract in pregnancy, second trimester: Secondary | ICD-10-CM | POA: Insufficient documentation

## 2021-05-10 DIAGNOSIS — B9689 Other specified bacterial agents as the cause of diseases classified elsewhere: Secondary | ICD-10-CM | POA: Diagnosis not present

## 2021-05-10 HISTORY — DX: Headache, unspecified: R51.9

## 2021-05-10 LAB — URINALYSIS, ROUTINE W REFLEX MICROSCOPIC
Bilirubin Urine: NEGATIVE
Glucose, UA: 150 mg/dL — AB
Hgb urine dipstick: NEGATIVE
Ketones, ur: 5 mg/dL — AB
Leukocytes,Ua: NEGATIVE
Nitrite: NEGATIVE
Protein, ur: NEGATIVE mg/dL
Specific Gravity, Urine: 1.025 (ref 1.005–1.030)
pH: 6 (ref 5.0–8.0)

## 2021-05-10 LAB — WET PREP, GENITAL
Sperm: NONE SEEN
Trich, Wet Prep: NONE SEEN
WBC, Wet Prep HPF POC: >=10 — AB (ref <10)
Yeast Wet Prep HPF POC: NONE SEEN

## 2021-05-10 LAB — POCT FERN TEST: POCT Fern Test: NEGATIVE

## 2021-05-10 LAB — AMNISURE RUPTURE OF MEMBRANE (ROM) NOT AT ARMC: Amnisure ROM: NEGATIVE

## 2021-05-10 MED ORDER — METRONIDAZOLE 500 MG PO TABS: 500.0000 mg | ORAL_TABLET | Freq: Two times a day (BID) | ORAL | 0 refills | Status: AC

## 2021-05-10 MED ORDER — FLUCONAZOLE 100 MG PO TABS: 150.0000 mg | ORAL_TABLET | Freq: Once | ORAL | 0 refills | Status: AC

## 2021-05-10 NOTE — MAU Provider Note (Addendum)
?History  ?  ? ?CSN: [id number] ? ?Arrival date and time: 05/10/21 0836 ? ? None  ?  ? ?Chief Complaint  ?Patient presents with  ? Rupture of Membranes  ? ?Patient is a 31yo female at 1w0dby LMP presenting for concern for loss of fluid.  She doesn't feel like her water broke because she knows how this feels from her last pregnancy, but does report waking up to wet underwear and a wet spot on her sheets.  She also could feels some trickle of fluid while she was driving to work and wanted to come and get it checked out.  She endorses some mild lower abdominal cramping that is inconsistent.  Reports +FM, no vaginal bleeding.  She last had intercourse on 4/18.  Denies any abnormal discharge.  She is feeling well otherwise.   ? ? ?OB History   ? ? Gravida  ?5  ? Para  ?1  ? Term  ?1  ? Preterm  ?0  ? AB  ?3  ? Living  ?1  ?  ? ? SAB  ?1  ? IAB  ?2  ? Ectopic  ?0  ? Multiple  ?0  ? Live Births  ?1  ?   ?  ?  ? ? ?Past Medical History:  ?Diagnosis Date  ? Asthma   ? Headache   ? ? ?Past Surgical History:  ?Procedure Laterality Date  ? NO PAST SURGERIES    ? ? ?Family History  ?Problem Relation Age of Onset  ? Diabetes Mother   ? Diabetes Father   ? Kidney disease Father   ? Hypertension Father   ? Liver disease Father   ? Cancer Maternal Grandmother   ? Diabetes Maternal Grandmother   ? Heart disease Maternal Grandmother   ? Diabetes Maternal Grandfather   ? ? ?Social History  ? ?Tobacco Use  ? Smoking status: Never  ? Smokeless tobacco: Never  ?Vaping Use  ? Vaping Use: Never used  ?Substance Use Topics  ? Alcohol use: No  ? Drug use: No  ? ? ?Allergies: No Known Allergies ? ?Medications Prior to Admission  ?Medication Sig Dispense Refill Last Dose  ? Magnesium 400 MG TABS Take 400 mg by mouth daily. 90 tablet 2 05/09/2021  ? Prenatal Vit-Fe Fumarate-FA (PREPLUS) 27-1 MG TABS Take 1 tablet by mouth daily. 30 tablet 13 05/09/2021  ? acetaminophen (TYLENOL) 325 MG tablet Take 2 tablets (650 mg total) by mouth every 6 (six)  hours as needed. 60 tablet 0   ? Blood Pressure Monitoring (BLOOD PRESSURE KIT) DEVI 1 Device by Does not apply route once a week. (Patient not taking: Reported on 04/18/2021) 1 each 0   ? cyclobenzaprine (FLEXERIL) 10 MG tablet Take 1 tablet (10 mg total) by mouth 3 (three) times daily as needed (headache). (Patient not taking: Reported on 04/18/2021) 30 tablet 0   ? Magnesium Oxide -Mg Supplement 400 MG CAPS Take 400 mg by mouth daily. Increase to twice daily if symptoms persists. (Patient not taking: Reported on 03/15/2021) 60 capsule 1   ? metoCLOPramide (REGLAN) 10 MG tablet Take 1 tablet (10 mg total) by mouth every 8 (eight) hours as needed for nausea (or headache). (Patient not taking: Reported on 04/18/2021) 30 tablet 0   ? ? ?Review of Systems ?Physical Exam  ? ?Blood pressure 121/72, pulse (!) 103, temperature 98 ?F (36.7 ?C), temperature source Oral, resp. rate 17, height _0  (1.803 m), weight 125.8 kg, last menstrual  period 11/02/2020, SpO2 100 %. ? ?Physical Exam ?Constitutional:   ?   Appearance: Normal appearance.  ?HENT:  ?   Head: Normocephalic and atraumatic.  ?Cardiovascular:  ?   Rate and Rhythm: Normal rate.  ?   Heart sounds: Normal heart sounds.  ?Pulmonary:  ?   Effort: Pulmonary effort is normal.  ?Abdominal:  ?   Comments: Gravid abdomen   ?Genitourinary: ?   General: Normal vulva.  ?   Vagina: Vaginal discharge present.  ?   Comments: Grey/white vaginal discharge, no pooling appreciated on sterile speculum exam, closed cervix ?Skin: ?   General: Skin is warm and dry.  ?Neurological:  ?   Mental Status: She is alert.  ?Psychiatric:     ?   Mood and Affect: Mood normal.  ? ? ?MAU Course  ?Procedures Sterile Speculum Exam  ? ?Patient had a sterile speculum exam which did not show any pooling.  No ferning appreciated on microscope exam.  Patient had a negative amnisure and her wet prep was positive for bacterial vaginosis.  Patient was notified of results, she was stable for discharge and has  a follow up in office next Friday 4/28.  Fetal tracing category 1.   ? ?Assessment and Plan  ?#Bacterial vaginosis  ?Likely cause for wet underwear, no pooling appreciated on sterile spec exam so unlikely PPROM ?-- negative ferning  ?-- amnisure: negative ?-- wet prep: positive for BV ?-- Start Metronidazole 532m BID x 7 days  ?-- Patient always gets a yeast infection with Abx so will prescribe fluconazole as well  ?-- Cat 1 fetal tracing  ? ?Dispo: stable for discharge, medications sent, has a follow up in office next Friday, return precautions given ? ?JNoralee Stain DO, PGY-1 ?05/10/2021, 10:44 AM  ?

## 2021-05-10 NOTE — MAU Note (Addendum)
...  Lisa Brown is a 31 y.o. at [redacted]w[redacted]d here in MAU reporting: LOF since 0600 this morning. She states she woke up this morning and her underwear were wet and she was lying on top of a small wet spot on her sheets. She states she felt a trickle of fluids while in her car on the way to work around 0730. She is also endorsing intermittent upper and lower abdominal cramping that began last week. Not wearing a pad. Denies VB. +FM. Last IC Tuesday. ? ?Pain score:  ?2/10 upper and lower abdomen ? ?FHT: patient wearing body suit in triage - unable to doppler FHT ?Lab orders placed from triage: UA, Fern ? ? ?

## 2021-05-18 ENCOUNTER — Ambulatory Visit (INDEPENDENT_AMBULATORY_CARE_PROVIDER_SITE_OTHER): Payer: Medicaid Other

## 2021-05-18 ENCOUNTER — Other Ambulatory Visit: Payer: Medicaid Other

## 2021-05-18 VITALS — BP 101/70 | HR 108 | Wt 280.0 lb

## 2021-05-18 DIAGNOSIS — Z23 Encounter for immunization: Secondary | ICD-10-CM | POA: Diagnosis not present

## 2021-05-18 DIAGNOSIS — Z3A28 28 weeks gestation of pregnancy: Secondary | ICD-10-CM

## 2021-05-18 DIAGNOSIS — Z3480 Encounter for supervision of other normal pregnancy, unspecified trimester: Secondary | ICD-10-CM

## 2021-05-18 NOTE — Progress Notes (Addendum)
ROB/GTT.  TDAP given in RD, tolerated well.  BTS signed today. ?

## 2021-05-18 NOTE — Progress Notes (Signed)
? ?LOW-RISK PREGNANCY OFFICE VISIT ? ?Patient name: Lisa Brown MRN [id number]  Date of birth: 1990/04/03 ?Chief Complaint:   ?Routine Prenatal Visit ? ?Subjective:   ?Lisa Brown is a 31 y.o. [phone] female at [redacted]w[redacted]d with an Estimated Date of Delivery: 08/09/21 being seen today for ongoing management of a low-risk pregnancy aeb has BMI 36.0-36.9,adult; Marijuana use; Supervision of other normal pregnancy, antepartum; and ASCUS of cervix with negative high risk HPV on their problem list. ? ?Patient presents today, alone, with  pelvic pressure/heaviness .  Patient reports her job requires frequent standing and bending which causes these symptoms.  She states she does not have a belly support band, but has ordered one.  She questions if she should quit or come OOW.    ? ?Patient endorses fetal movement, but acknowledges that it feels muted d/t anterior placenta. Patient denies abdominal cramping or contractions.  Patient denies vaginal concerns including abnormal discharge, leaking of fluid, and bleeding.  She states she was diagnosed with BV after being evaluated for ROM. Contractions: Not present. Vag. Bleeding: None.  Movement: Present. ? ?Reviewed past medical,surgical, social, obstetrical and family history as well as problem list, medications and allergies. ? ?Objective  ? ?Vitals:  ? 05/18/21 0919  ?BP: 101/70  ?Pulse: (!) 108  ?Weight: 280 lb (127 kg)  ?Body mass index is 39.05 kg/m?.  ?Total Weight Gain:15 lb (6.804 kg) ? ?  ?     Physical Examination:  ? General appearance: Well appearing, and in no distress ? Mental status: Alert, oriented to person, place, and time ? Skin: Warm & dry ? Cardiovascular: Normal heart rate noted ? Respiratory: Normal respiratory effort, no distress ? Abdomen: Soft, gravid, nontender, AGA with Fundal Height: 29 cm ? Pelvic: Cervical exam deferred          ? Extremities: Edema: None ? ?Fetal Status: Fetal Heart Rate (bpm): 143  Movement: Present  ? ?No results found for this or  any previous visit (from the past 24 hour(s)).  ?Assessment & Plan:  ?Low-risk pregnancy of a 31 y.o., [id number] at [redacted]w[redacted]d with an Estimated Date of Delivery: 08/09/21  ? ?1. Supervision of other normal pregnancy, antepartum ?-Anticipatory guidance for upcoming appts. ?-Patient to schedule next appt in 2-3 weeks for an in-person visit. ?-GTT completed today. ?-Reviewed blood draw procedures and labs which also include check of iron/HgB level, RPR, and HIV ?*Informed that repeat RPR/HIV are for pediatric records/compliance.  ?-Discussed how results of GTT are handled including diabetic education and BS testing for abnormal results and routine care for normal results.  ?-Discussed patient birth plans.  Will have Doula: Angelica Knight and does not desire eye ointment. ?-Plans to bring birth plan to next appt. ?-Informed that WB consents are now signed in the hospital if all criteria met.  ?-WB course completed in Feb 2440 and certificate on file.  ? ?2. [redacted] weeks gestation of pregnancy ?-Doing well. ?-Discussed concerns regarding work. ?-Informed that provider has no medical indication for OOW. ?-Encouraged to speak with employer regarding modified work regimen. ?-BTS consent signed today. ? ?3. Need for diphtheria-tetanus-pertussis (Tdap) vaccine ?-Discussed recommendation and research behind Tdap. ?-Patient verbalizes understanding and agreeable.  ? ?  ?Meds: No orders of the defined types were placed in this encounter. ? ?Labs/procedures today:  ?Lab Orders  ?No laboratory test(s) ordered today  ?  ? ?Reviewed: Preterm labor symptoms and general obstetric precautions including but not limited to vaginal bleeding, contractions, leaking of fluid and  fetal movement were reviewed in detail with the patient.  All questions were answered. ? ?Follow-up: No follow-ups on file. ? ?No orders of the defined types were placed in this encounter. ? ?Maryann Conners MSN, CNM ?05/18/2021 ? ?

## 2021-05-19 LAB — CBC
Hematocrit: 34 % (ref 34.0–46.6)
Hemoglobin: 11.2 g/dL (ref 11.1–15.9)
MCH: 27.4 pg (ref 26.6–33.0)
MCHC: 32.9 g/dL (ref 31.5–35.7)
MCV: 83 fL (ref 79–97)
Platelets: 301 10*3/uL (ref 150–450)
RBC: 4.09 x10E6/uL (ref 3.77–5.28)
RDW: 13.3 % (ref 11.7–15.4)
WBC: 8.9 10*3/uL (ref 3.4–10.8)

## 2021-05-19 LAB — GLUCOSE TOLERANCE, 2 HOURS W/ 1HR
Glucose, 1 hour: 252 mg/dL — ABNORMAL HIGH (ref 70–179)
Glucose, 2 hour: 203 mg/dL — ABNORMAL HIGH (ref 70–152)
Glucose, Fasting: 119 mg/dL — ABNORMAL HIGH (ref 70–91)

## 2021-05-19 LAB — HIV ANTIBODY (ROUTINE TESTING W REFLEX): HIV Screen 4th Generation wRfx: NONREACTIVE

## 2021-05-19 LAB — RPR: RPR Ser Ql: NONREACTIVE

## 2021-05-20 DIAGNOSIS — O24420 Gestational diabetes mellitus in childbirth, diet controlled: Secondary | ICD-10-CM | POA: Insufficient documentation

## 2021-05-21 ENCOUNTER — Telehealth: Payer: Self-pay | Admitting: *Deleted

## 2021-05-21 ENCOUNTER — Other Ambulatory Visit: Payer: Self-pay | Admitting: *Deleted

## 2021-05-21 DIAGNOSIS — O24419 Gestational diabetes mellitus in pregnancy, unspecified control: Secondary | ICD-10-CM

## 2021-05-21 MED ORDER — ACCU-CHEK GUIDE VI STRP
ORAL_STRIP | 6 refills | Status: DC
Start: 2021-05-21 — End: 2023-04-02

## 2021-05-21 MED ORDER — ACCU-CHEK SOFTCLIX LANCETS MISC: 6 refills | Status: DC

## 2021-05-21 MED ORDER — ACCU-CHEK GUIDE W/DEVICE KIT: PACK | 0 refills | Status: DC

## 2021-05-21 NOTE — Telephone Encounter (Signed)
Discussed glucose results with pt, made aware testing supplies sent today. ?Pt states she would like to have a 3 hr GTT as she wishes to have a waterbirth. ?Pt advised to discuss with provider at next visit. ?Pt made aware of order for N&D and advised to make appt for education.  ?

## 2021-05-21 NOTE — Progress Notes (Signed)
Diabetic testing supplies ordered ?N&D referral ordered.  ?

## 2021-06-07 ENCOUNTER — Ambulatory Visit: Payer: Medicaid Other | Attending: Obstetrics

## 2021-06-07 ENCOUNTER — Ambulatory Visit: Payer: Medicaid Other | Admitting: *Deleted

## 2021-06-07 VITALS — BP 109/61 | HR 96

## 2021-06-07 DIAGNOSIS — O99212 Obesity complicating pregnancy, second trimester: Secondary | ICD-10-CM

## 2021-06-07 DIAGNOSIS — Z148 Genetic carrier of other disease: Secondary | ICD-10-CM

## 2021-06-07 DIAGNOSIS — O99213 Obesity complicating pregnancy, third trimester: Secondary | ICD-10-CM | POA: Diagnosis not present

## 2021-06-07 DIAGNOSIS — O24420 Gestational diabetes mellitus in childbirth, diet controlled: Secondary | ICD-10-CM

## 2021-06-07 DIAGNOSIS — O24410 Gestational diabetes mellitus in pregnancy, diet controlled: Secondary | ICD-10-CM

## 2021-06-07 DIAGNOSIS — E669 Obesity, unspecified: Secondary | ICD-10-CM

## 2021-06-07 DIAGNOSIS — Z3480 Encounter for supervision of other normal pregnancy, unspecified trimester: Secondary | ICD-10-CM

## 2021-06-07 DIAGNOSIS — Z3A31 31 weeks gestation of pregnancy: Secondary | ICD-10-CM | POA: Diagnosis not present

## 2021-06-07 IMAGING — US US MFM OB FOLLOW-UP
1 series · 14 of 28 positions shown · non-contrast
Comparison: none

[Series 1: us mfm ob follow-up · 49 acquisitions, 14 frames shown]
[im 2/49]
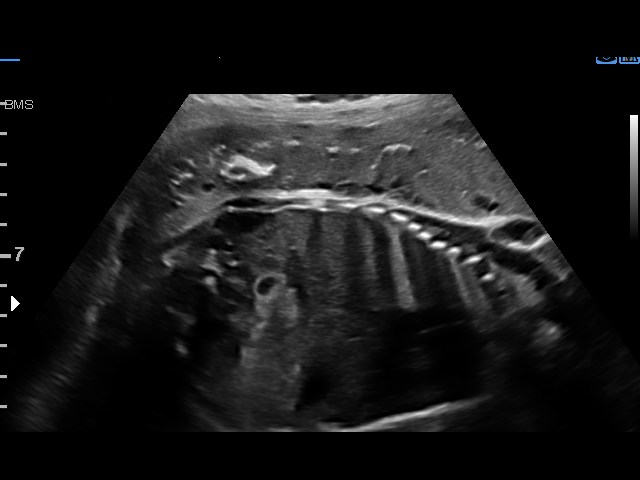
[im 6/49]
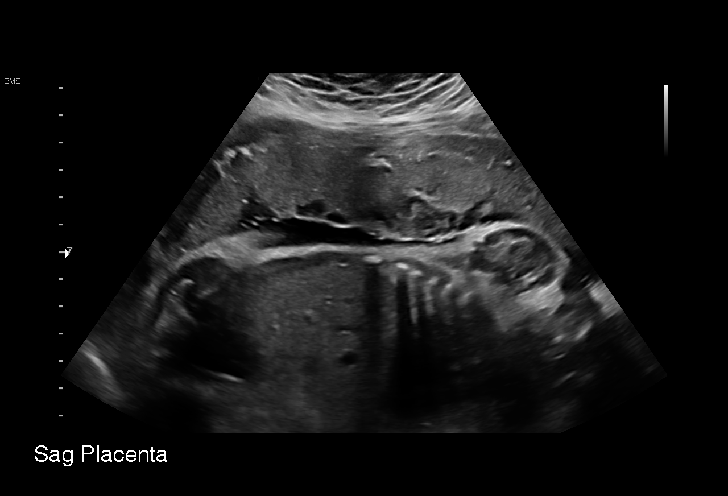
[im 9/49]
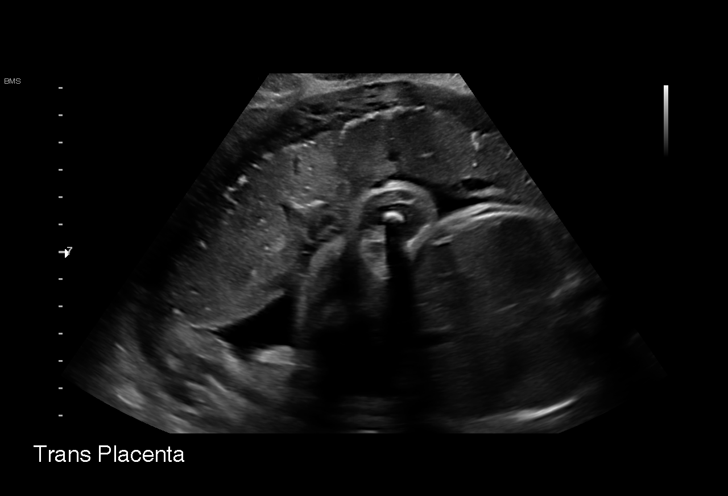
[im 13/49]
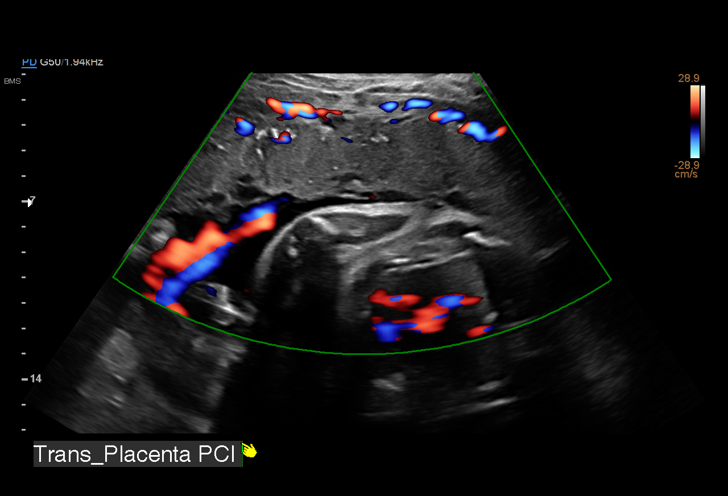
[im 17/49]
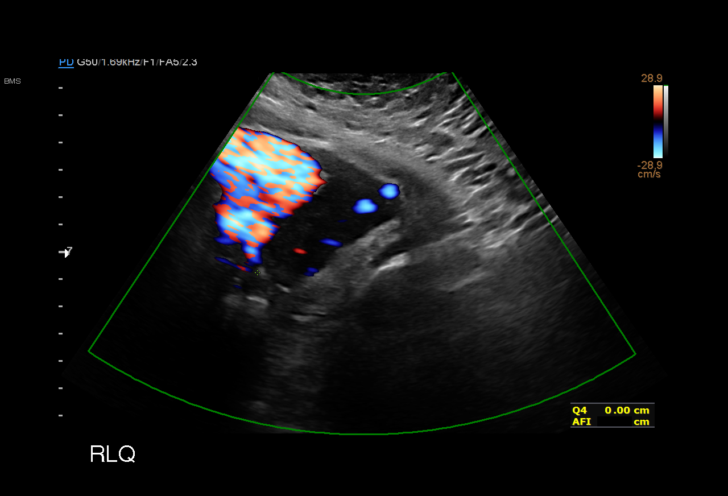
[im 20/49]
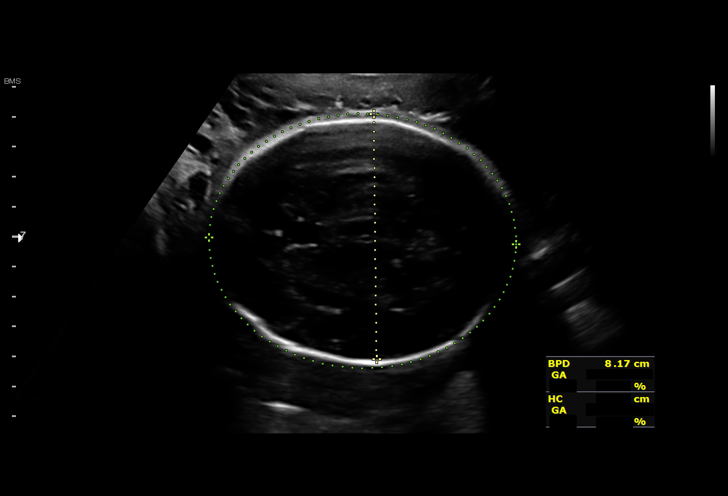
[im 24/49]
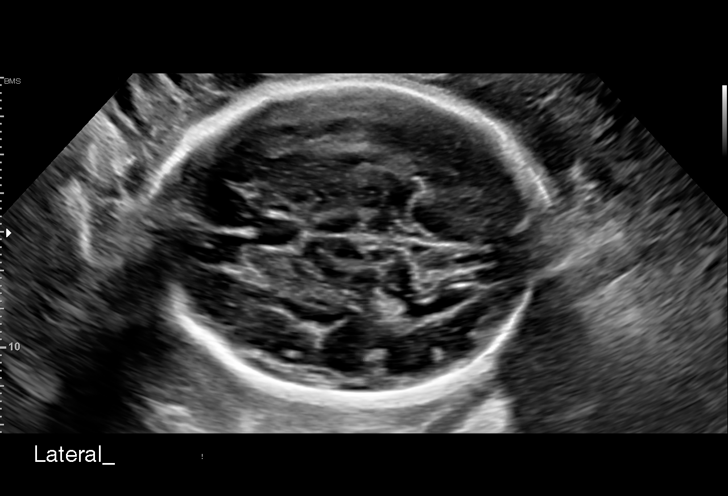
[im 27/49]
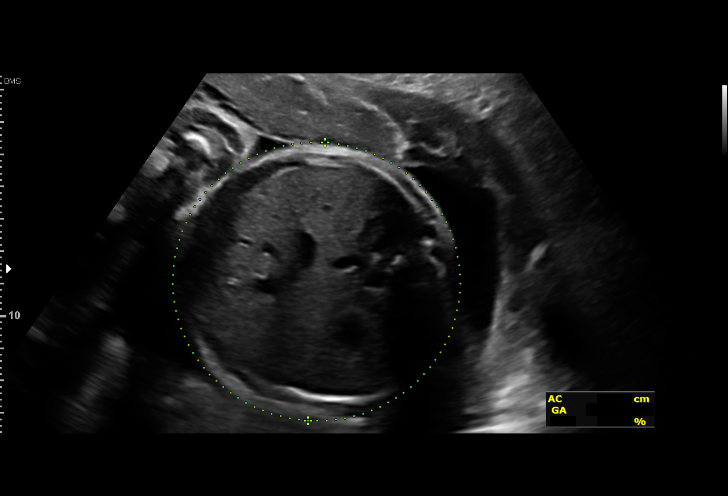
[im 31/49]
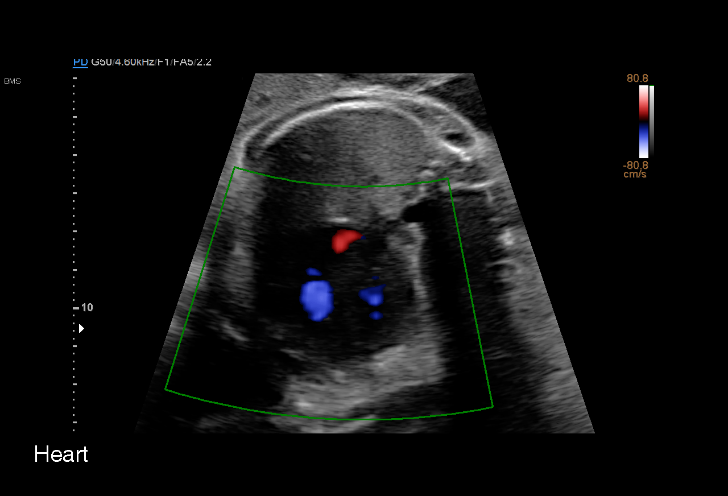
[im 34/49]
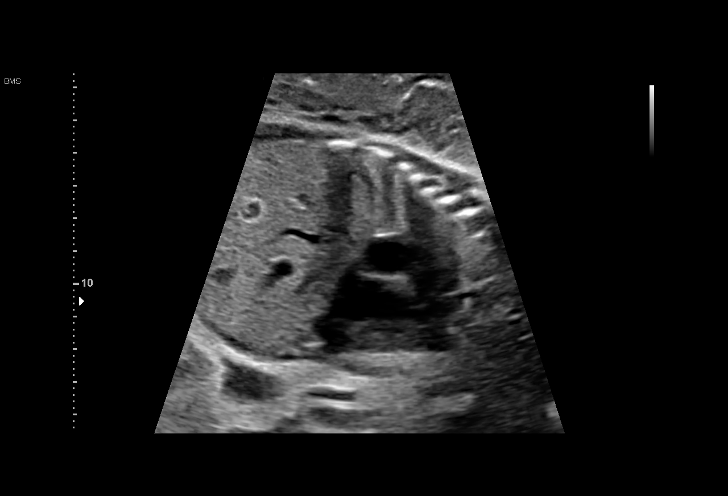
[im 38/49]
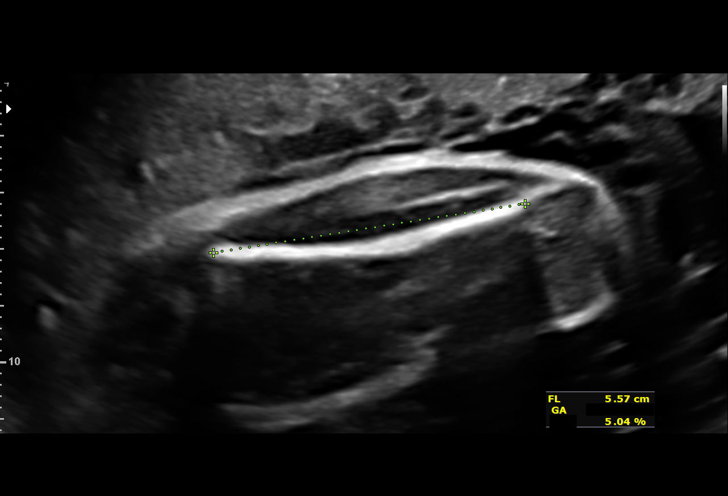
[im 41/49]
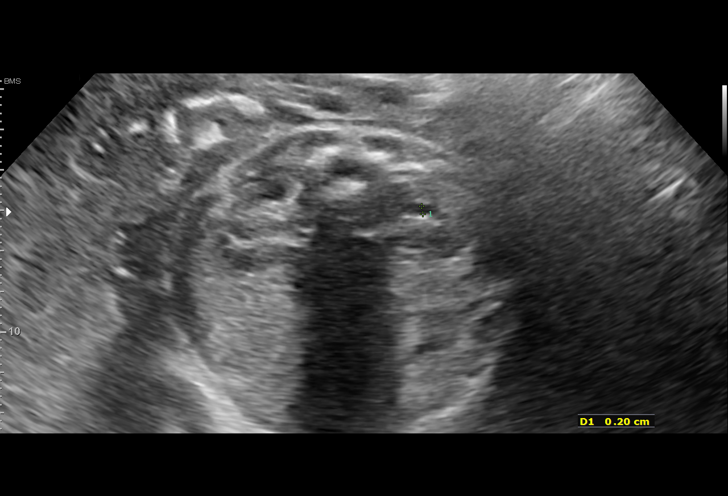
[im 45/49]
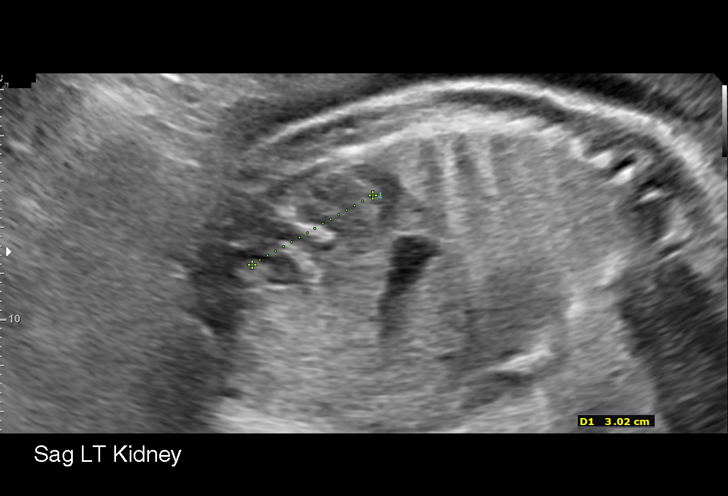
[im 49/49]
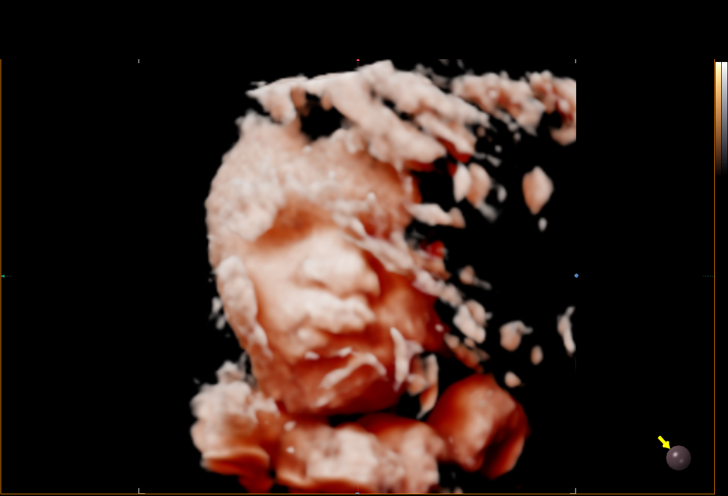

[14 of 28 positions shown; findings below may reference images not displayed]

Indications

 Obesity complicating pregnancy, second         [WU]
 trimester (pregravid BMI 36)
 Gestational diabetes in pregnancy, diet        [WU]
 controlled
 Low risk NIPS/neg AFP
 Genetic carrier (increased risk for SMA        [WU]
 carrier)
 31 weeks gestation of pregnancy
Fetal Evaluation

 Num Of Fetuses:         1
 Fetal Heart Rate(bpm):  129
 Cardiac Activity:       Observed
 Presentation:           Cephalic
 Placenta:               Anterior
 P. Cord Insertion:      Visualized, central

 Amniotic Fluid
 AFI FV:      Within normal limits

 AFI Sum(cm)     %Tile       Largest Pocket(cm)
 11.08           23

 RUQ(cm)       RLQ(cm)       LUQ(cm)        LLQ(cm)
 4.36          0
Biometry

 BPD:      81.7  mm     G. Age:  32w 6d         88  %    CI:        78.01   %    70 - 86
                                                         FL/HC:      19.1   %    19.3 -
 HC:      292.7  mm     G. Age:  32w 2d         49  %    HC/AC:      1.07        0.96 -
 AC:      274.6  mm     G. Age:  31w 4d         63  %    FL/BPD:     68.5   %    71 - 87
 FL:         56  mm     G. Age:  29w 3d          6  %    FL/AC:      20.4   %    20 - 24
 HUM:      53.3  mm     G. Age:  31w 0d         53  %

 LV:        2.1  mm

 Est. FW:    [WU]  gm    3 lb 12 oz      40  %
OB History

 Gravidity:    5         Term:   1        Prem:   0        SAB:   1
 TOP:          2       Ectopic:  0        Living: 1
Gestational Age

 LMP:           31w 0d        Date:  [DATE]                 EDD:   [DATE]
 U/S Today:     31w 4d                                        EDD:   [DATE]
 Best:          31w 0d     Det. By:  LMP  ([DATE])          EDD:   [DATE]
Anatomy

 Cranium:               Appears normal         Aortic Arch:            Previously seen
 Cavum:                 Appears normal         Ductal Arch:            Previously seen
 Ventricles:            Appears normal         Diaphragm:              Appears normal
 Choroid Plexus:        Previously seen        Stomach:                Appears normal, left
                                                                       sided
 Cerebellum:            Previously seen        Abdomen:                Previously seen
 Posterior Fossa:       Previously seen        Abdominal Wall:         Previously seen
 Nuchal Fold:           Previously seen        Cord Vessels:           Previously seen
 Face:                  Orbits and profile     Kidneys:                Appear normal
                        previously seen
 Lips:                  Previously seen        Bladder:                Appears normal
 Thoracic:              Previously seen        Spine:                  Previously seen
 Heart:                 Previously seen        Upper Extremities:      Previously seen
 RVOT:                  Previously seen        Lower Extremities:      Previously seen
 LVOT:                  Previously seen

 Other:  Fetus appears to be a male. VC, 3VV and 3VTV prev visualized. 3VV,
         nasal bone, maxilla, mandible, heels/feet and open hands/5th digits
         previously visualized.
Cervix Uterus Adnexa

 Cervix
 Not visualized (advanced GA >[WU])

 Uterus
 No abnormality visualized.

 Right Ovary
 Within normal limits.
 Left Ovary
 Within normal limits.

 Cul De Sac
 No free fluid seen.

 Adnexa
 No adnexal mass visualized.
Impression

 Follow up growth due to [WU]
 Normal interval growth with measurements consistent with
 dates
 Good fetal movement and amniotic fluid volume

 Ms. SAJUTI reports good blood sugars.
Recommendations

 Follow up growth in 4 weeks.

## 2021-06-08 ENCOUNTER — Other Ambulatory Visit: Payer: Self-pay | Admitting: *Deleted

## 2021-06-08 DIAGNOSIS — O24410 Gestational diabetes mellitus in pregnancy, diet controlled: Secondary | ICD-10-CM

## 2021-06-08 DIAGNOSIS — Z6836 Body mass index (BMI) 36.0-36.9, adult: Secondary | ICD-10-CM

## 2021-06-12 ENCOUNTER — Encounter: Payer: Medicaid Other | Admitting: Obstetrics & Gynecology

## 2021-06-12 ENCOUNTER — Ambulatory Visit (INDEPENDENT_AMBULATORY_CARE_PROVIDER_SITE_OTHER): Payer: Medicaid Other | Admitting: Advanced Practice Midwife

## 2021-06-12 VITALS — BP 114/66 | HR 101 | Wt 276.4 lb

## 2021-06-12 DIAGNOSIS — Z3A31 31 weeks gestation of pregnancy: Secondary | ICD-10-CM

## 2021-06-12 DIAGNOSIS — Z3480 Encounter for supervision of other normal pregnancy, unspecified trimester: Secondary | ICD-10-CM

## 2021-06-12 DIAGNOSIS — O24419 Gestational diabetes mellitus in pregnancy, unspecified control: Secondary | ICD-10-CM

## 2021-06-12 NOTE — Progress Notes (Signed)
Patient presents for ROB. 

## 2021-06-12 NOTE — Progress Notes (Signed)
PRENATAL VISIT NOTE  Subjective:  Lisa Brown is a 31 y.o. [phone] at [redacted]w[redacted]d being seen today for ongoing prenatal care.  She is currently monitored for the following issues for this low-risk pregnancy and has BMI 36.0-36.9,adult; Supervision of other normal pregnancy, antepartum; ASCUS of cervix with negative high risk HPV; and Gestational diabetes mellitus (GDM) in childbirth, diet controlled on their problem list.  Patient reports  occasional pelvic pressure .  Contractions: Not present. Vag. Bleeding: None.  Movement: Present. Denies leaking of fluid.   The following portions of the patient's history were reviewed and updated as appropriate: allergies, current medications, past family history, past medical history, past social history, past surgical history and problem list.   Objective:   Vitals:   06/12/21 1453  BP: 114/66  Pulse: (!) 101  Weight: 276 lb 6.4 oz (125.4 kg)    Fetal Status: Fetal Heart Rate (bpm): 135   Movement: Present     General:  Alert, oriented and cooperative. Patient is in no acute distress.  Skin: Skin is warm and dry. No rash noted.   Cardiovascular: Normal heart rate noted  Respiratory: Normal respiratory effort, no problems with respiration noted  Abdomen: Soft, gravid, appropriate for gestational age.  Pain/Pressure: Present     Pelvic: Cervical exam deferred        Extremities: Normal range of motion.  Edema: None  Mental Status: Normal mood and affect. Normal behavior. Normal judgment and thought content.   Assessment and Plan:  Pregnancy: [id number] at [redacted]w[redacted]d 1. Supervision of other normal pregnancy, antepartum --Anticipatory guidance about next visits/weeks of pregnancy given.  --Pt interested in waterbirth.  Discussed GDM and waterbirth, need for medications or poorly controlled glucose are contraindications to immersion in the tub. See GDM below.  2. Gestational diabetes mellitus (GDM) in third trimester, gestational diabetes method of  control unspecified --Pt brought her meter in today and glucose values reviewed.  PP values are mostly wnl with 2 out of ~ 12 values above 120 and highest at 131.  Fasting values, however are all above 95, mostly 110s, with 1-2 values in 120s.   --Discussed recommendation to start Metformin with patient.  Pt desires opportunity to make dietary changes.  Pt works some day and some night shifts and sometimes does not eat for hours at work, then eats at 2-3 am, making fasting values more difficult to obtain. --Discussed options to eat more protein rich snacks while working --Pt to exercise with walking/stretching daily --Bring log to visit in 1 week to review --If continuing to be above range, medication will be recommended.  This also means the patient cannot have a waterbirth.  Pt tearful and upset but states understanding.  I discussed ways she can have a positive birth experience even if she cannot have immersion in the tub, including hydrotherapy in the shower, use of birth ball, position changes, music, etc.    3. [redacted] weeks gestation of pregnancy   Preterm labor symptoms and general obstetric precautions including but not limited to vaginal bleeding, contractions, leaking of fluid and fetal movement were reviewed in detail with the patient. Please refer to After Visit Summary for other counseling recommendations.   Return in about 1 week (around 06/19/2021) for Midwife preferred but not required, for blood sugar check.  Future Appointments  Date Time Provider Department Center  06/20/2021  9:35 AM Hermina Staggers, MD [facility] None  07/02/2021  9:35 AM Leftwich-Kirby, Wilmer Floor, CNM [facility] None  07/04/2021 10:30  AM [facility] NURSE [facility]  07/04/2021 10:45 AM [facility] US4 [facility]    Sharen Counter, CNM

## 2021-06-12 NOTE — Progress Notes (Signed)
Pt reports fetal movement with some pressure. Pt reports that she would like to re-do GTT because her BGs have been normal at home

## 2021-06-20 ENCOUNTER — Encounter: Payer: Self-pay | Admitting: Obstetrics and Gynecology

## 2021-06-20 ENCOUNTER — Telehealth (INDEPENDENT_AMBULATORY_CARE_PROVIDER_SITE_OTHER): Payer: Medicaid Other | Admitting: Obstetrics and Gynecology

## 2021-06-20 DIAGNOSIS — Z3A32 32 weeks gestation of pregnancy: Secondary | ICD-10-CM

## 2021-06-20 DIAGNOSIS — O24420 Gestational diabetes mellitus in childbirth, diet controlled: Secondary | ICD-10-CM

## 2021-06-20 DIAGNOSIS — Z3480 Encounter for supervision of other normal pregnancy, unspecified trimester: Secondary | ICD-10-CM

## 2021-06-20 DIAGNOSIS — Z3009 Encounter for other general counseling and advice on contraception: Secondary | ICD-10-CM | POA: Insufficient documentation

## 2021-06-20 NOTE — Progress Notes (Signed)
Patient presents for ROB. Patient has no other concerns.   Patient states that her blood sugar levels has been around 90-100 fasting. Postprandials has been less than 120.

## 2021-06-20 NOTE — Progress Notes (Signed)
I connected with Lisa Brown 06/20/21 at  9:35 AM EDT by: MyChart video and verified that I am speaking with the correct person using two identifiers.  Patient is located at home and provider is located at [place].     I discussed the limitations, risks, security and privacy concerns of performing an evaluation and management service by MyChart video and the availability of in person appointments. I also discussed with the patient that there may be a patient responsible charge related to this service. By engaging in this virtual visit, you consent to the provision of healthcare.  Additionally, you authorize for your insurance to be billed for the services provided during this visit.  The patient expressed understanding and agreed to proceed.  The following staff members participated in the virtual visit:  Nurse    PRENATAL VISIT NOTE  Subjective:  Lisa Brown is a 31 y.o. Lisa Brown at [redacted]w[redacted]d  for virtual visit for ongoing prenatal care.  She is currently monitored for the following issues for this high-risk pregnancy and has BMI 36.0-36.9,adult; Supervision of other normal pregnancy, antepartum; ASCUS of cervix with negative high risk HPV; Gestational diabetes mellitus (GDM) in childbirth, diet controlled; and Unwanted fertility on their problem list.  Patient reports general discomforts of pregnancy.  Contractions: Not present. Vag. Bleeding: None.  Movement: Present. Denies leaking of fluid.   The following portions of the patient's history were reviewed and updated as appropriate: allergies, current medications, past family history, past medical history, past social history, past surgical history and problem list.   Objective:  There were no vitals filed for this visit. Self-Obtained  Fetal Status:     Movement: Present     Assessment and Plan:  Pregnancy: [id number] at [redacted]w[redacted]d 1. Supervision of other normal pregnancy, antepartum Stable  2. Gestational diabetes mellitus (GDM) in childbirth,  diet controlled CBG's in goal range Continue with diet Growth scanordered  3. Unwanted fertility BTL papers signed   Preterm labor symptoms and general obstetric precautions including but not limited to vaginal bleeding, contractions, leaking of fluid and fetal movement were reviewed in detail with the patient.  Return in about 2 weeks (around 07/04/2021) for OB visit, face to face, any provider.  Future Appointments  Date Time Provider [place]  07/02/2021  9:35 AM Leftwich-Kirby, Kathie Dike, CNM [facility] None  07/04/2021 10:30 AM [facility]SE [facility]  07/04/2021 10:45 AM [facility] US4 [facility] [place]     Time spent on virtual visit: 8 minutes  Chancy Milroy, MD

## 2021-06-22 ENCOUNTER — Other Ambulatory Visit: Payer: Self-pay

## 2021-06-27 ENCOUNTER — Encounter: Payer: Self-pay | Admitting: Emergency Medicine

## 2021-07-02 ENCOUNTER — Ambulatory Visit (INDEPENDENT_AMBULATORY_CARE_PROVIDER_SITE_OTHER): Payer: Medicaid Other | Admitting: Advanced Practice Midwife

## 2021-07-02 VITALS — BP 124/80 | HR 117 | Wt 276.2 lb

## 2021-07-02 DIAGNOSIS — Z3009 Encounter for other general counseling and advice on contraception: Secondary | ICD-10-CM

## 2021-07-02 DIAGNOSIS — Z3480 Encounter for supervision of other normal pregnancy, unspecified trimester: Secondary | ICD-10-CM

## 2021-07-02 DIAGNOSIS — Z3A34 34 weeks gestation of pregnancy: Secondary | ICD-10-CM

## 2021-07-02 DIAGNOSIS — O24420 Gestational diabetes mellitus in childbirth, diet controlled: Secondary | ICD-10-CM

## 2021-07-02 NOTE — Progress Notes (Signed)
PRENATAL VISIT NOTE  Subjective:  Lisa Brown is a 31 y.o. [phone] at [redacted]w[redacted]d being seen today for ongoing prenatal care.  She is currently monitored for the following issues for this low-risk pregnancy and has BMI 36.0-36.9,adult; Supervision of other normal pregnancy, antepartum; ASCUS of cervix with negative high risk HPV; Gestational diabetes mellitus (GDM) in childbirth, diet controlled; and Unwanted fertility on their problem list.  Patient reports no complaints.  Contractions: Irritability. Vag. Bleeding: None.  Movement: Present. Denies leaking of fluid.   The following portions of the patient's history were reviewed and updated as appropriate: allergies, current medications, past family history, past medical history, past social history, past surgical history and problem list.   Objective:   Vitals:   07/02/21 0957  BP: 124/80  Pulse: (!) 117  Weight: 276 lb 3.2 oz (125.3 kg)    Fetal Status:   Fundal Height: 35 cm Movement: Present     General:  Alert, oriented and cooperative. Patient is in no acute distress.  Skin: Skin is warm and dry. No rash noted.   Cardiovascular: Normal heart rate noted  Respiratory: Normal respiratory effort, no problems with respiration noted  Abdomen: Soft, gravid, appropriate for gestational age.  Pain/Pressure: Absent     Pelvic: Cervical exam deferred        Extremities: Normal range of motion.  Edema: None  Mental Status: Normal mood and affect. Normal behavior. Normal judgment and thought content.   Assessment and Plan:  Pregnancy: [id number] at [redacted]w[redacted]d 1. Supervision of other normal pregnancy, antepartum --Anticipatory guidance about next visits/weeks of pregnancy given.  - Pt interested in waterbirth and has attended the class with previous pregnancy. - Reviewed conditions in labor that will risk her out of water immersion including thick meconium or blood stained amniotic fluid, non-reassuring fetal status on monitor, excessive bleeding,  hypertension, dizziness, use of IV meds, damaged equipment or staffing that does not allow for water immersion, etc.  - The attending midwife must be on the unit for water immersion to begin; pt understands this may delay the start of water immersion. - Reminded pt that signing consent in labor at the hospital also acknowledges they will exit the tub if the attending midwife requests. - Consent given to patient for review.  Consent will be reviewed and signed at the hospital by the waterbirth provider prior to use of the tub. - Discussed other labor support options if waterbirth becomes unavailable, including position change, freedom of movement, use of birthing ball, and/or use of hydrotherapy in the shower (dependent upon medical condition/provider discretion).    2. Gestational diabetes mellitus (GDM) in childbirth, diet controlled --Pt does not have glucose log, confusion about appts, thought she had [place] today, OB appt later this week --Reviewed blood sugars with pt and she reports that if she is fasting 6 hours, her am glucose is always below 95, but if she eats during the night, it can be low 100s.  Postprandials are all below 120 per pt.   --Pt to bring detailed log, either on paper, or on a phone app and indicate when she is truly fasting and when she is not at next visit --Pt aware of risks of GDM, reviewed those today, and importance of glucose control.  3. Unwanted fertility --BTL papers signed 4/28  4. [redacted] weeks gestation of pregnancy   Preterm labor symptoms and general obstetric precautions including but not limited to vaginal bleeding, contractions, leaking of fluid and fetal movement were reviewed  in detail with the patient. Please refer to After Visit Summary for other counseling recommendations.   Return in about 2 weeks (around 07/16/2021) for Midwife preferred, LOB.  Future Appointments  Date Time Provider [place]  07/04/2021 10:30 AM [facility] NURSE [facility]   07/04/2021 10:45 AM [facility] US4 [facility] [place]    Fatima Blank, CNM

## 2021-07-04 ENCOUNTER — Ambulatory Visit: Payer: Medicaid Other | Attending: Maternal & Fetal Medicine

## 2021-07-04 ENCOUNTER — Ambulatory Visit: Payer: Medicaid Other | Admitting: *Deleted

## 2021-07-04 ENCOUNTER — Encounter: Payer: Self-pay | Admitting: *Deleted

## 2021-07-04 VITALS — BP 109/63 | HR 105

## 2021-07-04 DIAGNOSIS — Z148 Genetic carrier of other disease: Secondary | ICD-10-CM | POA: Diagnosis not present

## 2021-07-04 DIAGNOSIS — O24410 Gestational diabetes mellitus in pregnancy, diet controlled: Secondary | ICD-10-CM | POA: Diagnosis present

## 2021-07-04 DIAGNOSIS — Z3A34 34 weeks gestation of pregnancy: Secondary | ICD-10-CM

## 2021-07-04 DIAGNOSIS — Z6836 Body mass index (BMI) 36.0-36.9, adult: Secondary | ICD-10-CM | POA: Diagnosis not present

## 2021-07-04 DIAGNOSIS — O285 Abnormal chromosomal and genetic finding on antenatal screening of mother: Secondary | ICD-10-CM

## 2021-07-04 DIAGNOSIS — O99213 Obesity complicating pregnancy, third trimester: Secondary | ICD-10-CM | POA: Diagnosis not present

## 2021-07-04 IMAGING — US US MFM OB FOLLOW-UP
1 series · 13 of 28 positions shown · non-contrast
Comparison: none

[Series 1: us mfm ob follow-up · 34 acquisitions, 13 frames shown]
[im 2/34]
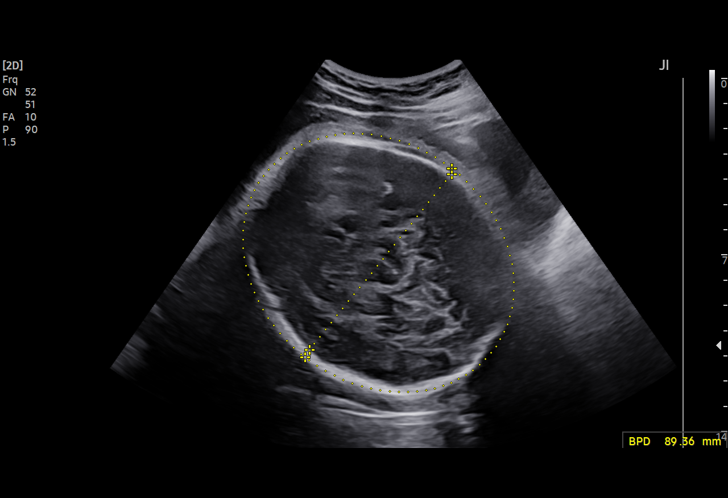
[im 4/34]
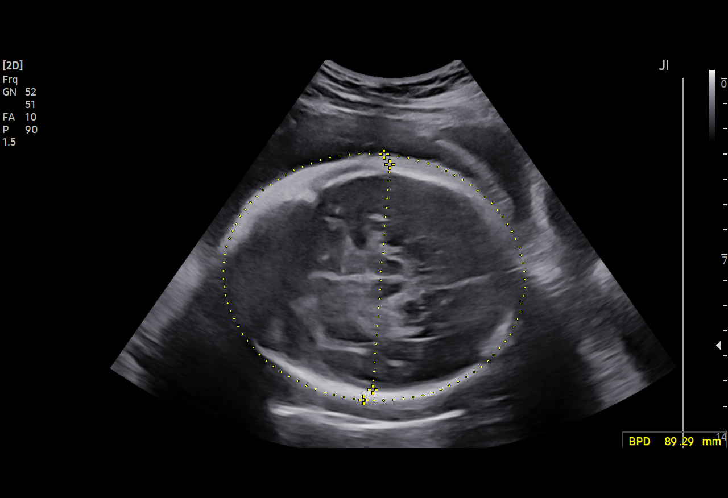
[im 7/34]
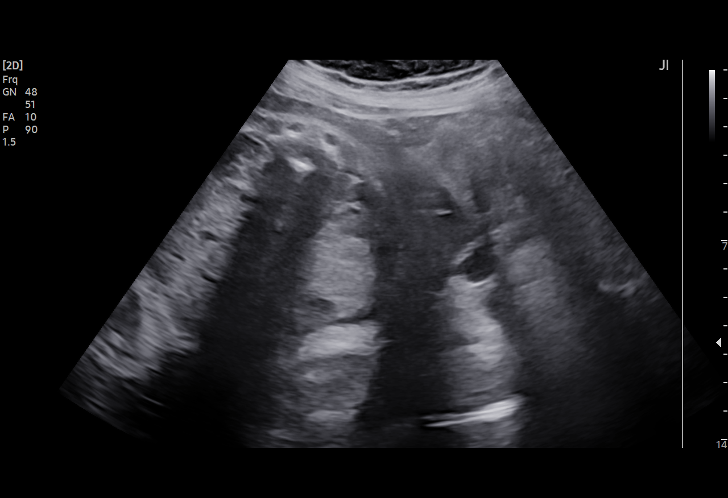
[im 9/34]
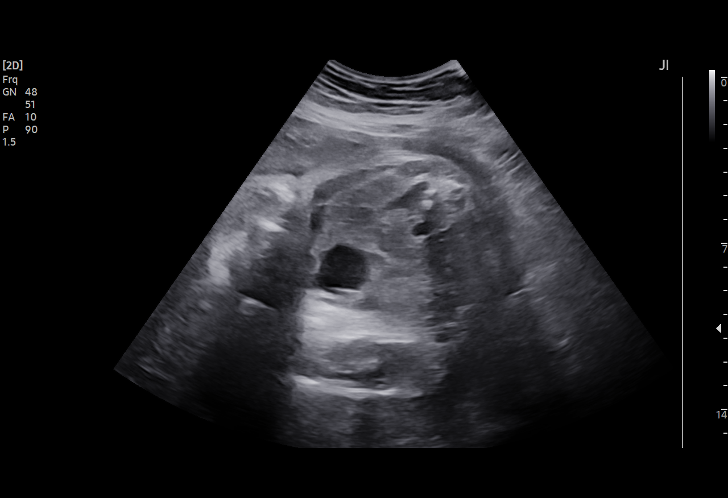
[im 12/34]
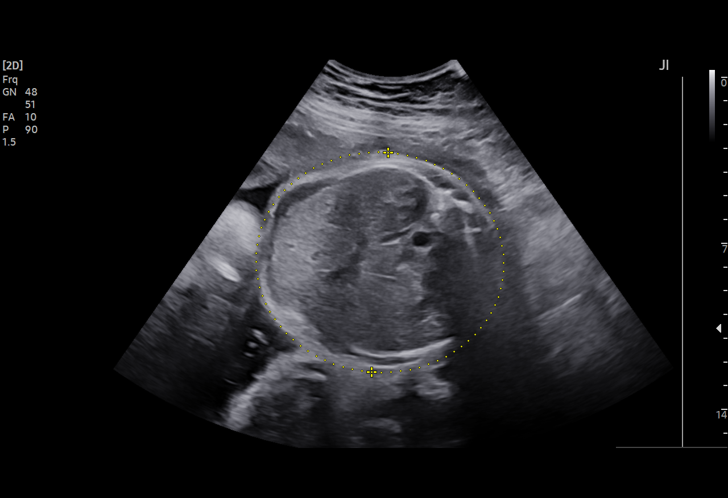
[im 14/34]
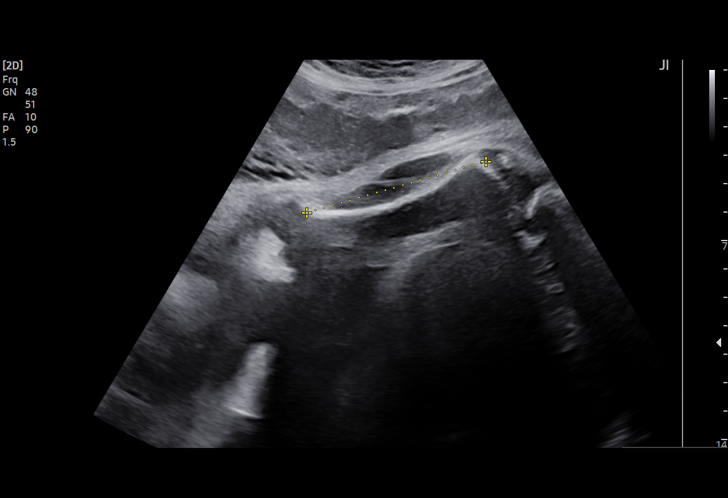
[im 18/34]
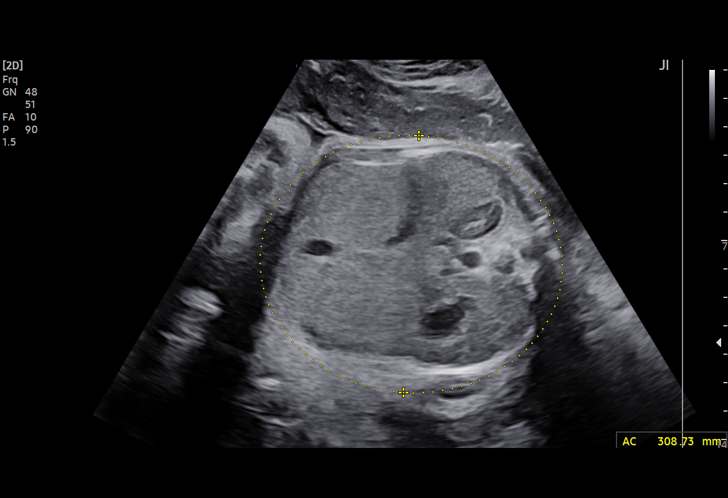
[im 20/34]
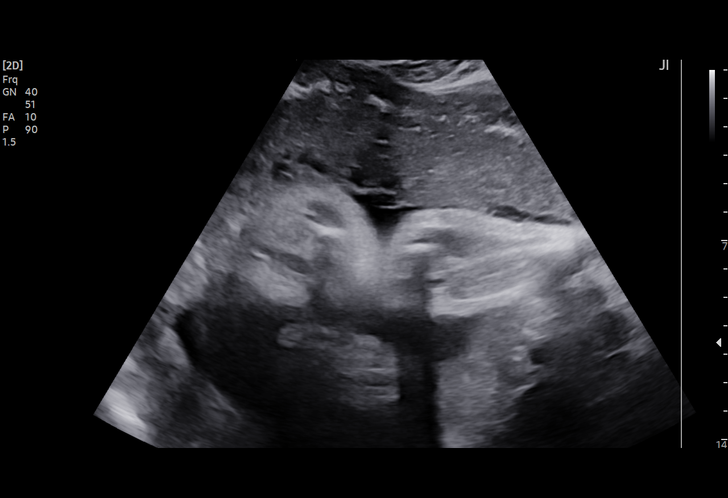
[im 23/34]
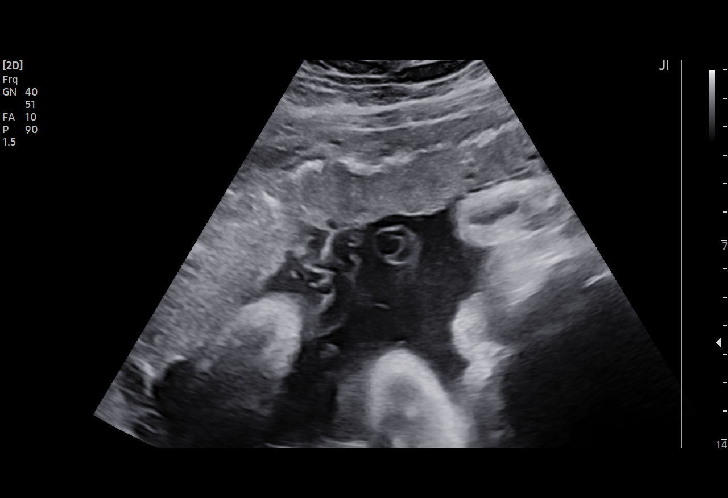
[im 25/34]
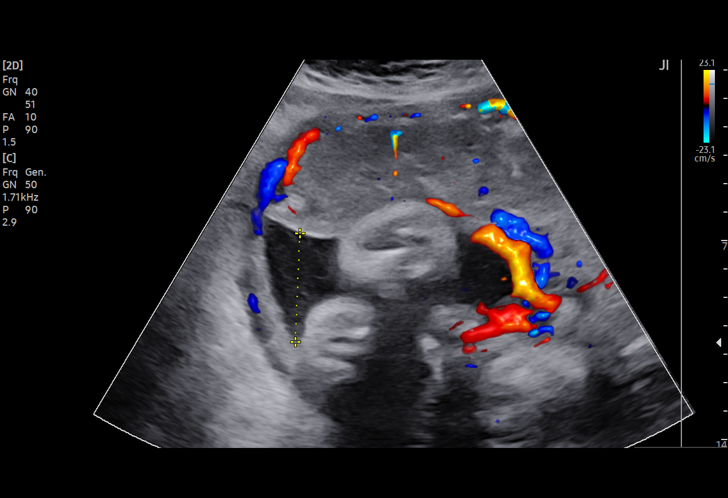
[im 27/34]
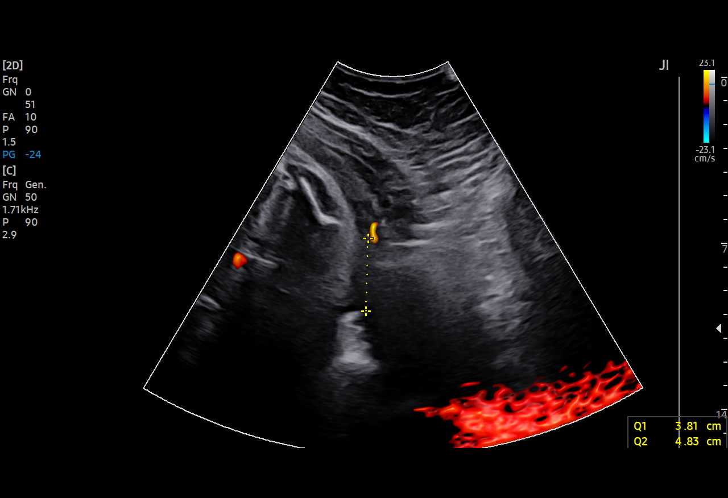
[im 30/34]
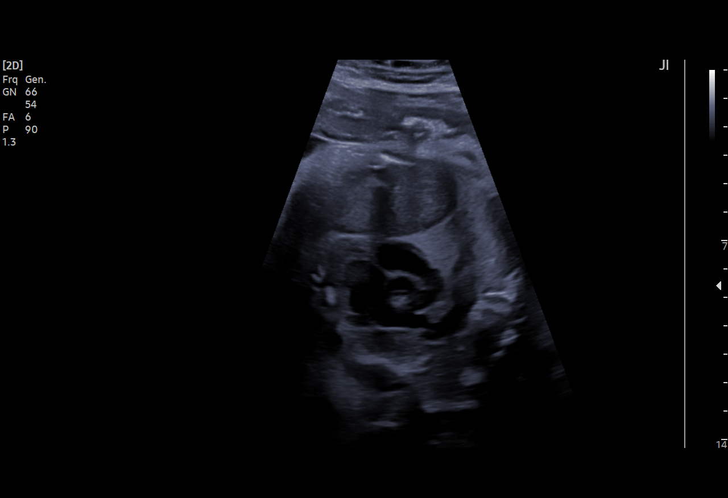
[im 32/34]
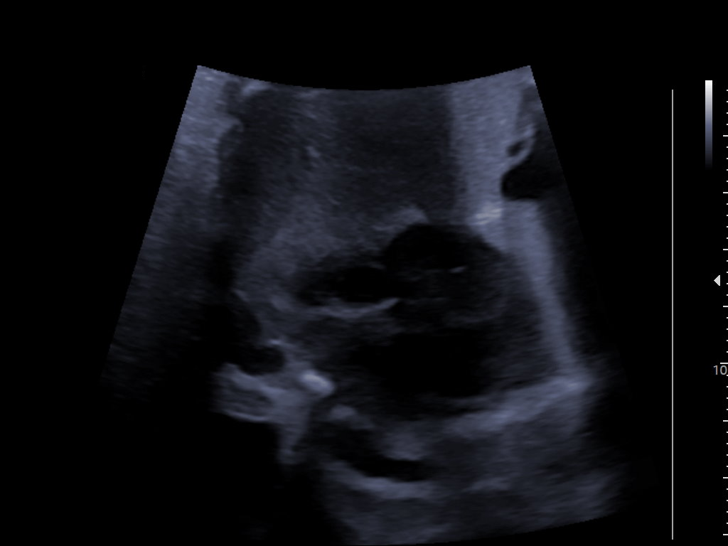

[13 of 28 positions shown; findings below may reference images not displayed]

GONSALES

Indications

 Gestational diabetes in pregnancy, diet        [9K]
 controlled
 Obesity complicating pregnancy, third          [9K]
 trimester (BMI 36)
 34 weeks gestation of pregnancy
 Low risk NIPS/neg AFP
 Genetic carrier (increased risk for SMA        [9K]
 carrier)
Vital Signs

                           Pulse:  105
 BP:          109/63
Fetal Evaluation

 Num Of Fetuses:         1
 Fetal Heart Rate(bpm):  164
 Cardiac Activity:       Observed
 Presentation:           Cephalic
 Placenta:               Anterior
 P. Cord Insertion:      Visualized, central

 Amniotic Fluid
 AFI FV:      Within normal limits

 AFI Sum(cm)     %Tile       Largest Pocket(cm)
 16.1            59
 RUQ(cm)       RLQ(cm)       LUQ(cm)        LLQ(cm)

Biometry

 BPD:     90.02  mm     G. Age:  36w 3d         89  %    CI:        72.76   %    70 - 86
                                                         FL/HC:      19.7   %    20.1 -
 HC:    335.57   mm     G. Age:  38w 3d         93  %    HC/AC:      1.08        0.93 -
 AC:    311.26   mm     G. Age:  35w 0d         63  %    FL/BPD:     73.5   %    71 - 87
 FL:      66.18  mm     G. Age:  34w 1d         23  %    FL/AC:      21.3   %    20 - 24

 Est. FW:    [9K]  gm    5 lb 13 oz      59  %
OB History

 Gravidity:    5         Term:   1        Prem:   0        SAB:   1
 TOP:          2       Ectopic:  0        Living: 1
Gestational Age

 LMP:           34w 6d        Date:  [DATE]                 EDD:   [DATE]
 U/S Today:     36w 0d                                        EDD:   [DATE]
 Best:          34w 6d     Det. By:  LMP  ([DATE])          EDD:   [DATE]
Anatomy

 Cranium:               Appears normal         Aortic Arch:            Previously seen
 Cavum:                 Appears normal         Ductal Arch:            Previously seen
 Ventricles:            Appears normal         Diaphragm:              Appears normal
 Choroid Plexus:        Previously seen        Stomach:                Appears normal, left
                                                                       sided
 Cerebellum:            Previously seen        Abdomen:                Previously seen
 Posterior Fossa:       Previously seen        Abdominal Wall:         Previously seen
 Nuchal Fold:           Previously seen        Cord Vessels:           Previously seen
 Face:                  Orbits and profile     Kidneys:                Appear normal
                        previously seen
 Lips:                  Previously seen        Bladder:                Appears normal
 Thoracic:              Previously seen        Spine:                  Previously seen
 Heart:                 Previously seen        Upper Extremities:      Previously seen
 RVOT:                  Previously seen        Lower Extremities:      Previously seen
 LVOT:                  Previously seen

 Other:  Fetus appears to be a male. VC, 3VV and 3VTV prev visualized. 3VV,
         nasal bone, maxilla, mandible, heels/feet and open hands/5th digits
         previously visualized.
Comments
 This patient was seen for a follow up growth scan due to
 maternal obesity with a BMI of 37 and diet-controlled
 gestational diabetes.  She reports good glycemic control and
 denies any problems since her last exam.
 She was informed that the fetal growth and amniotic fluid
 level appears appropriate for her gestational age.
 As the fetal growth is within normal limits, no further exams
 were scheduled in our office.
 Due to gestational diabetes, delivery should be considered at
 around 39 weeks.

## 2021-07-09 ENCOUNTER — Encounter (HOSPITAL_COMMUNITY): Payer: Self-pay | Admitting: Family Medicine

## 2021-07-09 ENCOUNTER — Inpatient Hospital Stay (HOSPITAL_COMMUNITY)
Admission: AD | Admit: 2021-07-09 | Discharge: 2021-07-09 | Disposition: A | Discharge status: Home / Self Care | Payer: Medicaid Other | Attending: Family Medicine | Admitting: Family Medicine

## 2021-07-09 DIAGNOSIS — O4703 False labor before 37 completed weeks of gestation, third trimester: Secondary | ICD-10-CM | POA: Diagnosis not present

## 2021-07-09 DIAGNOSIS — Z3480 Encounter for supervision of other normal pregnancy, unspecified trimester: Secondary | ICD-10-CM

## 2021-07-09 DIAGNOSIS — Z3A35 35 weeks gestation of pregnancy: Secondary | ICD-10-CM | POA: Insufficient documentation

## 2021-07-09 DIAGNOSIS — O24420 Gestational diabetes mellitus in childbirth, diet controlled: Secondary | ICD-10-CM

## 2021-07-09 DIAGNOSIS — Z3689 Encounter for other specified antenatal screening: Secondary | ICD-10-CM

## 2021-07-09 DIAGNOSIS — O479 False labor, unspecified: Secondary | ICD-10-CM

## 2021-07-09 NOTE — MAU Provider Note (Signed)
History     CSN: [id number]  Arrival date and time: 07/09/21 1037   Event Date/Time   First Provider Initiated Contact with Patient 07/09/21 1115      Chief Complaint  Patient presents with   Vaginal Discharge   HPI Lisa Brown is a 31 y.o. [id number] at [redacted]w[redacted]d who presents to MAU reporting she lost her mucus plug yesterday. Patient states she tried to obtain an appointment in the office but was told she could not be worked in until next week and should present to MAU for evaluation of preterm labor.  On arrival to MAU patient denies pain, does not think she is having contractions. She denies vaginal bleeding, leaking of fluid, decreased fetal movement, fever, falls, or recent illness.   Patient receives care with [facility].  OB History     Gravida  5   Para  1   Term  1   Preterm  0   AB  3   Living  1      SAB  1   IAB  2   Ectopic  0   Multiple  0   Live Births  1           Past Medical History:  Diagnosis Date   Asthma    Headache     Past Surgical History:  Procedure Laterality Date   NO PAST SURGERIES      Family History  Problem Relation Age of Onset   Diabetes Mother    Diabetes Father    Kidney disease Father    Hypertension Father    Liver disease Father    Cancer Maternal Grandmother    Diabetes Maternal Grandmother    Heart disease Maternal Grandmother    Diabetes Maternal Grandfather     Social History   Tobacco Use   Smoking status: Never   Smokeless tobacco: Never  Vaping Use   Vaping Use: Never used  Substance Use Topics   Alcohol use: No   Drug use: No    Allergies: No Known Allergies  Medications Prior to Admission  Medication Sig Dispense Refill Last Dose   Magnesium Oxide -Mg Supplement 400 MG CAPS Take 400 mg by mouth daily. Increase to twice daily if symptoms persists. 60 capsule 1 07/08/2021   Prenatal Vit-Fe Fumarate-FA (PREPLUS) 27-1 MG TABS Take 1 tablet by mouth daily. 30 tablet 13 07/08/2021    Accu-Chek Softclix Lancets lancets Use 1 lancet to check blood sugar 4 times daily 100 each 6    acetaminophen (TYLENOL) 325 MG tablet Take 2 tablets (650 mg total) by mouth every 6 (six) hours as needed. (Patient not taking: Reported on 07/02/2021) 60 tablet 0    Blood Glucose Monitoring Suppl (ACCU-CHEK GUIDE) w/Device KIT Use glucose meter to monitor blood sugar 4 times daily 1 kit 0    Blood Pressure Monitoring (BLOOD PRESSURE KIT) DEVI 1 Device by Does not apply route once a week. 1 each 0    glucose blood (ACCU-CHEK GUIDE) test strip Use 1 strip to check blood sugar 4 times daily 100 each 6    Magnesium 400 MG TABS Take 400 mg by mouth daily. 90 tablet 2     Review of Systems  All other systems reviewed and are negative.  Physical Exam   Blood pressure 112/67, pulse 97, temperature 98.5 F (36.9 C), temperature source [place], resp. rate 18, height $RemoveBe'5\' 11"'SGOAVKoWm$  (1.803 m), weight 124.3 kg, last menstrual period 11/02/2020, SpO2 98 %.  Physical Exam Vitals and nursing note reviewed. Exam conducted with a chaperone present.  Constitutional:      Appearance: Normal appearance.  Cardiovascular:     Rate and Rhythm: Normal rate and regular rhythm.     Pulses: Normal pulses.     Heart sounds: Normal heart sounds.  Pulmonary:     Effort: Pulmonary effort is normal.     Breath sounds: Normal breath sounds.  Abdominal:     Comments: Gravid  Skin:    Capillary Refill: Capillary refill takes less than 2 seconds.  Neurological:     Mental Status: She is alert and oriented to person, place, and time.  Psychiatric:        Mood and Affect: Mood normal.        Behavior: Behavior normal.        Thought Content: Thought content normal.        Judgment: Judgment normal.     MAU Course  Procedures  MDM --Ob hx Term SVD x 1 --Cervix closed/thick/posterior on initial assessment -- > 35 weeks, FFN not indicated --Reactive tracing: baseline 140, mod var, + 15 x 15 accels, no decels --Toco: rare  contraction, pain score 0 throughout encounter --Patient declines recheck of cervix prior to discharge   Patient Vitals for the past 24 hrs:  BP Temp Temp src Pulse Resp SpO2 Height Weight  07/09/21 1218 112/61 -- -- 98 -- -- -- --  07/09/21 1105 112/67 -- -- 97 -- 98 % -- --  07/09/21 1054 111/67 98.5 F (36.9 C) [place] 100 18 100 % $Rem'5\' 11"'Drxe$  (1.803 m) 124.3 kg   Assessment and Plan  --31 y.o. [id number] at [redacted]w[redacted]d  --Reactive tracing --Closed cervix --Discharge home in stable condition  F/U: Next appointment at [facility] is 07/18/21  Darlina Rumpf, [place], MSN, CNM 07/09/2021, 5:32 PM

## 2021-07-09 NOTE — MAU Note (Signed)
Lisa Brown is a 31 y.o. at [redacted]w[redacted]d here in MAU reporting: lost her mucous plug yesterday.  Was told to follow up within 2 days.  Dr can't see her this week.  Denies bleeding or LOF, occ ctxs - nothing regular.   No hx of PTL, no cervical exam ever. +FM  Onset of complaint: yesterday Pain score: mild/mod Vitals:   07/09/21 1054  BP: 111/67  Pulse: 100  Resp: 18  Temp: 98.5 F (36.9 C)  SpO2: 100%     FHT:144 Lab orders placed from triage:  urine

## 2021-07-18 ENCOUNTER — Encounter: Payer: Self-pay | Admitting: Student

## 2021-07-18 ENCOUNTER — Ambulatory Visit (INDEPENDENT_AMBULATORY_CARE_PROVIDER_SITE_OTHER): Payer: Medicaid Other | Admitting: Student

## 2021-07-18 ENCOUNTER — Other Ambulatory Visit (HOSPITAL_COMMUNITY)
Admission: RE | Admit: 2021-07-18 | Discharge: 2021-07-18 | Disposition: A | Discharge status: Home / Self Care | Payer: Medicaid Other | Source: Ambulatory Visit | Attending: Certified Nurse Midwife | Admitting: Certified Nurse Midwife

## 2021-07-18 VITALS — BP 109/70 | HR 105 | Wt 277.6 lb

## 2021-07-18 DIAGNOSIS — Z6836 Body mass index (BMI) 36.0-36.9, adult: Secondary | ICD-10-CM

## 2021-07-18 DIAGNOSIS — N898 Other specified noninflammatory disorders of vagina: Secondary | ICD-10-CM

## 2021-07-18 DIAGNOSIS — Z3009 Encounter for other general counseling and advice on contraception: Secondary | ICD-10-CM

## 2021-07-18 DIAGNOSIS — O26893 Other specified pregnancy related conditions, third trimester: Secondary | ICD-10-CM

## 2021-07-18 DIAGNOSIS — Z3480 Encounter for supervision of other normal pregnancy, unspecified trimester: Secondary | ICD-10-CM

## 2021-07-18 DIAGNOSIS — O24410 Gestational diabetes mellitus in pregnancy, diet controlled: Secondary | ICD-10-CM

## 2021-07-18 DIAGNOSIS — Z3A36 36 weeks gestation of pregnancy: Secondary | ICD-10-CM

## 2021-07-18 NOTE — Progress Notes (Signed)
Pt presents for ROB, GC/CT, and GBS. She reports possible BV infection.

## 2021-07-18 NOTE — Progress Notes (Signed)
   PRENATAL VISIT NOTE  Subjective:  Lisa Brown is a 31 y.o. [phone] at [redacted]w[redacted]d being seen today for ongoing prenatal care.  She is currently monitored for the following issues for this low-risk pregnancy and has BMI 36.0-36.9,adult; Supervision of other normal pregnancy, antepartum; ASCUS of cervix with negative high risk HPV; Gestational diabetes mellitus (GDM) in childbirth, diet controlled; and Unwanted fertility on their problem list.  Patient reports vaginal irritation x 2 days.  Contractions: Irritability. Vag. Bleeding: None.  Movement: Present. Denies leaking of fluid.   The following portions of the patient's history were reviewed and updated as appropriate: allergies, current medications, past family history, past medical history, past social history, past surgical history and problem list.   Objective:   Vitals:   07/18/21 0904  BP: 109/70  Pulse: (!) 105  Weight: 277 lb 9.6 oz (125.9 kg)    Fetal Status: Fetal Heart Rate (bpm): 150 Fundal Height: 37 cm Movement: Present     General:  Alert, oriented and cooperative. Patient is in no acute distress.  Skin: Skin is warm and dry. No rash noted.   Cardiovascular: Normal heart rate noted  Respiratory: Normal respiratory effort, no problems with respiration noted  Abdomen: Soft, gravid, appropriate for gestational age.  Pain/Pressure: Present     Pelvic: Cervical exam deferred      White discharge present  Extremities: Normal range of motion.  Edema: None  Mental Status: Normal mood and affect. Normal behavior. Normal judgment and thought content.   Assessment and Plan:  Pregnancy: G5P1031 at [redacted]w[redacted]d 1. Supervision of other normal pregnancy, antepartum -doing well - Cervicovaginal ancillary only( [place]) - Strep Gp B NAA  2. [redacted] weeks gestation of pregnancy - GBS collected today  3. Vaginal discharge during pregnancy in third trimester - Patient noticed vaginal discharge that started two days ago - Cervicovaginal  ancillary only( [place])  4. Unwanted fertility - BTL consent signed 05/18/21  5. Diet controlled gestational diabetes mellitus (GDM) in third trimester - reports normal blood sugars at home - MFM recommends consideration of delivery at 39 weeks due to GDM. Discussed this report with patient. Patient is motivated to maintain normal blood sugar control and strongly desires spontaneous labor with waterbirth.   6. BMI 36.0-36.9,adult - Last growth [place] normal  Preterm labor symptoms and general obstetric precautions including but not limited to vaginal bleeding, contractions, leaking of fluid and fetal movement were reviewed in detail with the patient. Please refer to After Visit Summary for other counseling recommendations.   Return in about 1 week (around 07/25/2021) for LOB, IN-PERSON.  Future Appointments  Date Time Provider Department Center  07/25/2021  9:15 AM Leftwich-Kirby, Wilmer Floor, CNM [facility] None  08/02/2021  8:55 AM Anyanwu, Jethro Bastos, MD [facility] None    Corlis Hove, NP

## 2021-07-19 LAB — CERVICOVAGINAL ANCILLARY ONLY
Bacterial Vaginitis (gardnerella): NEGATIVE
Candida Glabrata: NEGATIVE
Candida Vaginitis: POSITIVE — AB
Chlamydia: NEGATIVE
Comment: NEGATIVE
Comment: NEGATIVE
Comment: NEGATIVE
Comment: NEGATIVE
Comment: NEGATIVE
Comment: NORMAL
Neisseria Gonorrhea: NEGATIVE
Trichomonas: NEGATIVE

## 2021-07-20 LAB — STREP GP B NAA: Strep Gp B NAA: POSITIVE — AB

## 2021-07-21 ENCOUNTER — Encounter (HOSPITAL_COMMUNITY): Payer: Self-pay | Admitting: Obstetrics and Gynecology

## 2021-07-21 ENCOUNTER — Inpatient Hospital Stay (HOSPITAL_COMMUNITY)
Admission: AD | Admit: 2021-07-21 | Discharge: 2021-07-21 | Disposition: A | Discharge status: Home / Self Care | Payer: Medicaid Other | Attending: Obstetrics and Gynecology | Admitting: Obstetrics and Gynecology

## 2021-07-21 DIAGNOSIS — O471 False labor at or after 37 completed weeks of gestation: Secondary | ICD-10-CM | POA: Diagnosis not present

## 2021-07-21 DIAGNOSIS — Z3A37 37 weeks gestation of pregnancy: Secondary | ICD-10-CM | POA: Diagnosis not present

## 2021-07-21 DIAGNOSIS — O24420 Gestational diabetes mellitus in childbirth, diet controlled: Secondary | ICD-10-CM

## 2021-07-21 DIAGNOSIS — Z3480 Encounter for supervision of other normal pregnancy, unspecified trimester: Secondary | ICD-10-CM

## 2021-07-21 MED ORDER — BUTORPHANOL TARTRATE 2 MG/ML IJ SOLN: 1.0000 mg | Freq: Once | INTRAMUSCULAR | Status: DC

## 2021-07-21 MED ORDER — PROMETHAZINE HCL 25 MG/ML IJ SOLN
12.5000 mg | Freq: Once | INTRAMUSCULAR | Status: DC
  Filled 2021-07-21: qty 1

## 2021-07-21 NOTE — MAU Provider Note (Cosign Needed Addendum)
S: Ms. Lisa Brown is a 31 y.o. [phone] at [redacted]w[redacted]d  who presents to MAU today complaining contractions q 2 minutes since 0700. She denies vaginal bleeding. She denies LOF. She reports normal fetal movement.    O: BP 128/76   Pulse 97   Temp 98.1 F (36.7 C) (Oral)   LMP 11/02/2020   SpO2 99%  GENERAL: Well-developed, well-nourished female in no acute distress.  HEAD: Normocephalic, atraumatic.  CHEST: Normal effort of breathing, regular heart rate ABDOMEN: Soft, nontender, gravid  Cervical exam:  Dilation: 2 Effacement (%): 60 Cervical Position: Posterior Station: -3 Presentation: Undeterminable Exam by:: Erle Crocker, RN  After 1.5 hours cervix was rechecked and unchanged. Patient was offered pain medication as she is rating her pain 8/10; she declined. She requested to walk around/ plan to recheck cervix in 1-2 hours.   Fetal Monitoring: category 1 fetal tracing Patient request to walk around the room.  RN to give report to Luna Kitchens who resumes care of the patient.     A: [facility] at [redacted]w[redacted]d    P: -Patient requesting pain medicine; she is unchanged after 3 cervical checks.  -will order phenergan and stadol but patient will have to stay for monitoring, will discharge home with labor precautions   Rasch, Harolyn Rutherford, NP 07/21/2021 5:53 PM

## 2021-07-21 NOTE — MAU Note (Signed)
Lisa Brown is a 31 y.o. at [redacted]w[redacted]d here in MAU reporting: CTX that started this morning at 0700. Pt states CTX are less than 2 min apart. No VB, discharge or LOF. +FM  Onset of complaint: 0700 07/21/2021  Pain score: 8/10 Vitals:   07/21/21 1602 07/21/21 1603  BP: 140/90 140/90  Pulse: 91   Temp: 98.1 F (36.7 C)   SpO2: 100%      FHT: 145 Lab orders placed from triage:

## 2021-07-23 ENCOUNTER — Telehealth: Payer: Self-pay | Admitting: *Deleted

## 2021-07-23 NOTE — Telephone Encounter (Signed)
Pt  called to office stating she is still contracting and wants to know what she should do since she was sent home from hospital. Advised pt to monitor ctx, if she is in more pain, having more pressure, bleeding or any fluid- she should be seen at hospital.  Advised pt to stay hydrated as she is nauseated.  Made pt aware of [employer] that she may try at home to help with labor.   Again, advised pt she may call office anytime and/or be seen at the hospital for eval as needed.

## 2021-07-25 ENCOUNTER — Encounter: Payer: Self-pay | Admitting: Advanced Practice Midwife

## 2021-07-25 ENCOUNTER — Ambulatory Visit (INDEPENDENT_AMBULATORY_CARE_PROVIDER_SITE_OTHER): Payer: Medicaid Other | Admitting: Advanced Practice Midwife

## 2021-07-25 VITALS — BP 127/74 | HR 94 | Wt 274.0 lb

## 2021-07-25 DIAGNOSIS — O479 False labor, unspecified: Secondary | ICD-10-CM

## 2021-07-25 DIAGNOSIS — Z3480 Encounter for supervision of other normal pregnancy, unspecified trimester: Secondary | ICD-10-CM

## 2021-07-25 DIAGNOSIS — O24420 Gestational diabetes mellitus in childbirth, diet controlled: Secondary | ICD-10-CM

## 2021-07-25 DIAGNOSIS — Z3A37 37 weeks gestation of pregnancy: Secondary | ICD-10-CM

## 2021-07-25 NOTE — Addendum Note (Signed)
Addended by: Sharen Counter A on: 07/25/2021 10:39 AM   Modules accepted: Orders

## 2021-07-25 NOTE — Progress Notes (Addendum)
   PRENATAL VISIT NOTE  Subjective:  Lisa Brown is a 31 y.o. [phone] at [redacted]w[redacted]d being seen today for ongoing prenatal care.  She is currently monitored for the following issues for this low-risk pregnancy and has BMI 36.0-36.9,adult; Supervision of other normal pregnancy, antepartum; ASCUS of cervix with negative high risk HPV; Gestational diabetes mellitus (GDM) in childbirth, diet controlled; and Unwanted fertility on their problem list.  Patient reports occasional contractions.  Contractions: Irregular. Vag. Bleeding: None.  Movement: Present. Denies leaking of fluid.   The following portions of the patient's history were reviewed and updated as appropriate: allergies, current medications, past family history, past medical history, past social history, past surgical history and problem list.   Objective:   Vitals:   07/25/21 0928  BP: 127/74  Pulse: 94  Weight: 274 lb (124.3 kg)    Fetal Status: Fetal Heart Rate (bpm): 159 Fundal Height: 38 cm Movement: Present     General:  Alert, oriented and cooperative. Patient is in no acute distress.  Skin: Skin is warm and dry. No rash noted.   Cardiovascular: Normal heart rate noted  Respiratory: Normal respiratory effort, no problems with respiration noted  Abdomen: Soft, gravid, appropriate for gestational age.  Pain/Pressure: Present     Pelvic: Cervical exam performed in the presence of a chaperone        Extremities: Normal range of motion.  Edema: None  Mental Status: Normal mood and affect. Normal behavior. Normal judgment and thought content.   Assessment and Plan:  Pregnancy: G5P1031 at [redacted]w[redacted]d 1. Supervision of other normal pregnancy, antepartum --Anticipatory guidance about next visits/weeks of pregnancy given.  --Discussed option of membrane sweep at 39 week visit if desired --Reviewed labor readiness with patient including the [employer], evening primrose oil, and raspberry leaf tea.     2. Gestational diabetes mellitus  (GDM) in childbirth, diet controlled --Reviewed glucose log, fasting glucose values with less than 10 % elevated, none greater than 100 and PP values all wnl --Good glucose control, continue current care --EFW 59% on 6/14 MFM [place], FH wnl  --Discussed recommendations for IOL at 39-40 weeks and risks of GDM in pregnancy. Pt prefers to wait until 40 weeks.   --Discussed waterbirth with IOL as possible if Pitocin is not required continuously. --IOL scheduled for 7/20, orders placed.  3. [redacted] weeks gestation of pregnancy   4. Braxton Hicks contractions --Pt with frequent painful contractions, was able to take Benadryl after MAU visit on 7/1 and rest --Contractions today, every 3-5 minutes, some strong enough to breath through, others not as strong --labor precautions reviewed  Term labor symptoms and general obstetric precautions including but not limited to vaginal bleeding, contractions, leaking of fluid and fetal movement were reviewed in detail with the patient. Please refer to After Visit Summary for other counseling recommendations.   Return in about 1 week (around 08/01/2021) for As scheduled, Midwife preferred, also reschedule MFM [place] if possible. .  Future Appointments  Date Time Provider Department Center  08/02/2021  8:55 AM Anyanwu, Jethro Bastos, MD [facility] None    Sharen Counter, CNM

## 2021-07-27 ENCOUNTER — Other Ambulatory Visit: Payer: Self-pay | Admitting: Student

## 2021-07-27 DIAGNOSIS — B3731 Acute candidiasis of vulva and vagina: Secondary | ICD-10-CM

## 2021-07-27 MED ORDER — CLOTRIMAZOLE 1 % VA CREA: 1.0000 | TOPICAL_CREAM | Freq: Every day | VAGINAL | 0 refills | Status: AC

## 2021-07-27 MED ORDER — CLOTRIMAZOLE 1 % EX CREA: 1.0000 | TOPICAL_CREAM | Freq: Two times a day (BID) | CUTANEOUS | 0 refills | Status: DC

## 2021-08-01 ENCOUNTER — Observation Stay (HOSPITAL_COMMUNITY)
Admission: AD | Admit: 2021-08-01 | Discharge: 2021-08-02 | Disposition: A | Discharge status: Home / Self Care | Payer: Medicaid Other | Attending: Obstetrics and Gynecology | Admitting: Obstetrics and Gynecology

## 2021-08-01 ENCOUNTER — Other Ambulatory Visit: Payer: Self-pay

## 2021-08-01 DIAGNOSIS — O24420 Gestational diabetes mellitus in childbirth, diet controlled: Secondary | ICD-10-CM

## 2021-08-01 DIAGNOSIS — O471 False labor at or after 37 completed weeks of gestation: Principal | ICD-10-CM | POA: Insufficient documentation

## 2021-08-01 DIAGNOSIS — O99820 Streptococcus B carrier state complicating pregnancy: Secondary | ICD-10-CM | POA: Insufficient documentation

## 2021-08-01 DIAGNOSIS — Z3480 Encounter for supervision of other normal pregnancy, unspecified trimester: Secondary | ICD-10-CM

## 2021-08-01 DIAGNOSIS — Z3A39 39 weeks gestation of pregnancy: Secondary | ICD-10-CM | POA: Insufficient documentation

## 2021-08-01 DIAGNOSIS — Z6836 Body mass index (BMI) 36.0-36.9, adult: Secondary | ICD-10-CM

## 2021-08-01 DIAGNOSIS — R87610 Atypical squamous cells of undetermined significance on cytologic smear of cervix (ASC-US): Secondary | ICD-10-CM | POA: Diagnosis present

## 2021-08-01 DIAGNOSIS — O368330 Maternal care for abnormalities of the fetal heart rate or rhythm, third trimester, not applicable or unspecified: Secondary | ICD-10-CM | POA: Insufficient documentation

## 2021-08-01 DIAGNOSIS — O479 False labor, unspecified: Principal | ICD-10-CM

## 2021-08-01 DIAGNOSIS — O24410 Gestational diabetes mellitus in pregnancy, diet controlled: Secondary | ICD-10-CM | POA: Diagnosis present

## 2021-08-02 ENCOUNTER — Encounter: Payer: Medicaid Other | Admitting: Obstetrics & Gynecology

## 2021-08-02 ENCOUNTER — Other Ambulatory Visit: Payer: Self-pay

## 2021-08-02 ENCOUNTER — Encounter (HOSPITAL_COMMUNITY): Payer: Self-pay | Admitting: Obstetrics & Gynecology

## 2021-08-02 DIAGNOSIS — O471 False labor at or after 37 completed weeks of gestation: Secondary | ICD-10-CM | POA: Diagnosis present

## 2021-08-02 DIAGNOSIS — O24410 Gestational diabetes mellitus in pregnancy, diet controlled: Secondary | ICD-10-CM | POA: Diagnosis not present

## 2021-08-02 DIAGNOSIS — Z3A39 39 weeks gestation of pregnancy: Secondary | ICD-10-CM | POA: Diagnosis not present

## 2021-08-02 DIAGNOSIS — O368330 Maternal care for abnormalities of the fetal heart rate or rhythm, third trimester, not applicable or unspecified: Secondary | ICD-10-CM | POA: Diagnosis not present

## 2021-08-02 DIAGNOSIS — O99820 Streptococcus B carrier state complicating pregnancy: Secondary | ICD-10-CM | POA: Diagnosis not present

## 2021-08-02 LAB — CBC
HCT: 33.1 % — ABNORMAL LOW (ref 36.0–46.0)
Hemoglobin: 11.2 g/dL — ABNORMAL LOW (ref 12.0–15.0)
MCH: 28.2 pg (ref 26.0–34.0)
MCHC: 33.8 g/dL (ref 30.0–36.0)
MCV: 83.4 fL (ref 80.0–100.0)
Platelets: 244 10*3/uL (ref 150–400)
RBC: 3.97 MIL/uL (ref 3.87–5.11)
RDW: 15 % (ref 11.5–15.5)
WBC: 7.6 10*3/uL (ref 4.0–10.5)
nRBC: 0 % (ref 0.0–0.2)

## 2021-08-02 LAB — TYPE AND SCREEN
ABO/RH(D): O POS
Antibody Screen: NEGATIVE

## 2021-08-02 LAB — GLUCOSE, CAPILLARY
Glucose-Capillary: 183 mg/dL — ABNORMAL HIGH (ref 70–99)
Glucose-Capillary: 104 mg/dL — ABNORMAL HIGH (ref 70–99)

## 2021-08-02 LAB — RPR: RPR Ser Ql: NONREACTIVE

## 2021-08-02 MED ORDER — OXYTOCIN BOLUS FROM INFUSION: 333.0000 mL | Freq: Once | INTRAVENOUS | Status: DC

## 2021-08-02 MED ORDER — OXYCODONE-ACETAMINOPHEN 5-325 MG PO TABS: 2.0000 | ORAL_TABLET | ORAL | Status: DC | PRN

## 2021-08-02 MED ORDER — LACTATED RINGERS IV SOLN: 500.0000 mL | INTRAVENOUS | Status: DC | PRN

## 2021-08-02 MED ORDER — PENICILLIN G POT IN DEXTROSE 60000 UNIT/ML IV SOLN: 3.0000 10*6.[IU] | INTRAVENOUS | Status: DC

## 2021-08-02 MED ORDER — ONDANSETRON HCL 4 MG/2ML IJ SOLN: 4.0000 mg | Freq: Four times a day (QID) | INTRAMUSCULAR | Status: DC | PRN

## 2021-08-02 MED ORDER — SODIUM CHLORIDE 0.9 % IV SOLN: 5.0000 10*6.[IU] | Freq: Once | INTRAVENOUS | Status: DC

## 2021-08-02 MED ORDER — TERBUTALINE SULFATE 1 MG/ML IJ SOLN: 0.2500 mg | Freq: Once | INTRAMUSCULAR | Status: DC | PRN

## 2021-08-02 MED ORDER — SODIUM CHLORIDE 0.9 % IV SOLN
2.0000 g | Freq: Four times a day (QID) | INTRAVENOUS | Status: DC
  Administered 2021-08-02: 2 g via INTRAVENOUS

## 2021-08-02 MED ORDER — SOD CITRATE-CITRIC ACID 500-334 MG/5ML PO SOLN: 30.0000 mL | ORAL | Status: DC | PRN

## 2021-08-02 MED ORDER — OXYCODONE-ACETAMINOPHEN 5-325 MG PO TABS: 1.0000 | ORAL_TABLET | ORAL | Status: DC | PRN

## 2021-08-02 MED ORDER — LIDOCAINE HCL (PF) 1 % IJ SOLN: 30.0000 mL | INTRAMUSCULAR | Status: DC | PRN

## 2021-08-02 MED ORDER — LACTATED RINGERS IV SOLN: INTRAVENOUS | Status: DC

## 2021-08-02 MED ORDER — MISOPROSTOL 50MCG HALF TABLET: 50.0000 ug | ORAL_TABLET | ORAL | Status: DC | PRN

## 2021-08-02 MED ORDER — ACETAMINOPHEN 325 MG PO TABS: 650.0000 mg | ORAL_TABLET | ORAL | Status: DC | PRN

## 2021-08-02 MED ORDER — OXYTOCIN-SODIUM CHLORIDE 30-0.9 UT/500ML-% IV SOLN: 2.5000 [IU]/h | INTRAVENOUS | Status: DC

## 2021-08-02 NOTE — Progress Notes (Signed)
Patient getting into tub at this time, water temperature assessed by this RN, temperature is 104.63F, checked x2 to ensure accuracy.  This RN explained to patient that policy is not to have water temperature greater than 100.29F due to risks for maternal dehydration, overheating, or fetal tachycardia (explained to patient as "increasing your baby's heart rate"). Patient explains that she has been taking baths at home for the last week "hotter than this" and that the temperature feels good to her. This RN encourages PO hydration, and mother agrees to continue intermittent fetal heart rate monitoring to observe for fetal tachycardia. Patient is very kind and agreeable in conversation, just states that she likes the water hot.  CNM Mayford Knife made aware of conversation with patient, is unconcerned with water temperature. Also discussed brief period of fetal monitoring between 0705-0709 with minimal variability, provider has reviewed strip, reassured by Category 1 strip in MAU and agreeable to continue intermittent fetal heart rate monitoring with doppler per policy while patient is in tub.  This RN agreeable.

## 2021-08-02 NOTE — Progress Notes (Signed)
Patient is requesting that she have her membranes swept prior to being discharged to labor at home (she had an appointment scheduled for a membrane sweep this morning that she missed due to being at the hospital). Patient has some concerns that significant other will miss the delivery if she does not delivery in the next "couple of days" due to his employment as a truck driver, but is hopeful that the membrane sweep will allow her to go into labor sooner.  CNM Simpson made aware of patient's requests, agreeable to plan, will round with patient after monitoring. Agrees that patient can stay off the monitor as she has had a reactive NST.

## 2021-08-02 NOTE — Progress Notes (Signed)
Patient ID: Lisa Brown, female   DOB: 06/13/1990, 31 y.o.   MRN: [id number] Doing well Vitals:   08/02/21 0024 08/02/21 0250 08/02/21 0426  BP: 112/73 136/74 119/77  Pulse: 98 99 96  Resp: 20  16  Temp: 98.8 F (37.1 C)  97.9 F (36.6 C)  TempSrc: Oral  Oral  SpO2: 99%    Weight: 127.7 kg    Height: 5\' 11"  (1.803 m)     FHR has remained reassuring, OK for waterbirth  Patient's doula has arrived for labor support Plans to take placenta home

## 2021-08-02 NOTE — MAU Provider Note (Signed)
Chief Complaint:  Abdominal Pain   Event Date/Time   First Provider Initiated Contact with Patient 08/02/21 0138     HPI: Lisa Brown is a 31 y.o. G5P1031 at 29w0dwho presents to maternity admissions reporting sharp pains and some wetness today.  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  States never felt pain with first baby until she was 7cm. She reports good fetal movement, denies LOF, vaginal bleeding, vaginal itching/burning, urinary symptoms, h/a, dizziness, n/v, diarrhea, constipation or fever/chills.  She denies headache, visual changes or RUQ abdominal pain.  Abdominal Pain This is a new problem. The current episode started today. The problem occurs intermittently. The quality of the pain is sharp. Pertinent negatives include no constipation, diarrhea, fever, myalgias, nausea or vomiting. Nothing aggravates the pain. The pain is relieved by Nothing.   RN note: Lisa Brown is a 31 y.o. at [redacted]w[redacted]d here in MAU reporting: intermittent sharp lower ABD pain that started after she walked around 2030 last night. Pt report she has a yeast infection, but has not seen any discharge since treatment started but concerned that she might be leaking fluid cause her underwear are constantly wet today. Pt denies VB, DFM, PIH s/s, recent intercourse, and complications in the pregnancy.  GBS pos SVE last Weds 3.5 cm  Onset of complaint: 2030 yesterday   Past Medical History: Past Medical History:  Diagnosis Date   Asthma    Headache     Past obstetric history: OB History  Gravida Para Term Preterm AB Living  5 1 1  0 3 1  SAB IAB Ectopic Multiple Live Births  1 2 0 0 1    # Outcome Date GA Lbr Len/2nd Weight Sex Delivery Anes PTL Lv  5 Current           4 Term 04/06/16 [redacted]w[redacted]d 18:53 / 00:44 3635 g F Vag-Spont None  LIV  3 IAB           2 IAB           1 SAB             Past Surgical History: Past Surgical History:  Procedure Laterality Date   NO PAST SURGERIES      Family History: Family  History  Problem Relation Age of Onset   Diabetes Mother    Diabetes Father    Kidney disease Father    Hypertension Father    Liver disease Father    Cancer Maternal Grandmother    Diabetes Maternal Grandmother    Heart disease Maternal Grandmother    Diabetes Maternal Grandfather     Social History: Social History   Tobacco Use   Smoking status: Never   Smokeless tobacco: Never  Vaping Use   Vaping Use: Never used  Substance Use Topics   Alcohol use: No   Drug use: No    Allergies: No Known Allergies  Meds:  Medications Prior to Admission  Medication Sig Dispense Refill Last Dose   Magnesium 400 MG TABS Take 400 mg by mouth daily. 90 tablet 2 Past Week   Prenatal Vit-Fe Fumarate-FA (PREPLUS) 27-1 MG TABS Take 1 tablet by mouth daily. 30 tablet 13 08/02/2021   Accu-Chek Softclix Lancets lancets Use 1 lancet to check blood sugar 4 times daily (Patient not taking: Reported on 07/18/2021) 100 each 6 Unknown   acetaminophen (TYLENOL) 325 MG tablet Take 2 tablets (650 mg total) by mouth every 6 (six) hours as needed. (Patient not taking: Reported on 07/02/2021) 60  tablet 0 Unknown   Blood Glucose Monitoring Suppl (ACCU-CHEK GUIDE) w/Device KIT Use glucose meter to monitor blood sugar 4 times daily (Patient not taking: Reported on 07/18/2021) 1 kit 0 Unknown   Blood Pressure Monitoring (BLOOD PRESSURE KIT) DEVI 1 Device by Does not apply route once a week. (Patient not taking: Reported on 07/18/2021) 1 each 0 Unknown   clotrimazole (GYNE-LOTRIMIN) 1 % vaginal cream Place 1 Applicatorful vaginally at bedtime for 7 days. 30 g 0 Unknown   glucose blood (ACCU-CHEK GUIDE) test strip Use 1 strip to check blood sugar 4 times daily (Patient not taking: Reported on 07/18/2021) 100 each 6 Unknown   Magnesium Oxide -Mg Supplement 400 MG CAPS Take 400 mg by mouth daily. Increase to twice daily if symptoms persists. (Patient not taking: Reported on 07/18/2021) 60 capsule 1 Unknown    I have reviewed  patient's Past Medical Hx, Surgical Hx, Family Hx, Social Hx, medications and allergies.   ROS:  Review of Systems  Constitutional:  Negative for fever.  Gastrointestinal:  Positive for abdominal pain. Negative for constipation, diarrhea, nausea and vomiting.  Musculoskeletal:  Negative for myalgias.   Other systems negative  Physical Exam  Patient Vitals for the past 24 hrs:  BP Temp Temp src Pulse Resp SpO2 Height Weight  08/02/21 0024 112/73 98.8 F (37.1 C) Oral 98 20 99 % $Re'5\' 11"'mQs$  (1.803 m) 127.7 kg   Constitutional: Well-developed, well-nourished female in no acute distress.  Cardiovascular: normal rate and rhythm Respiratory: normal effort, clear to auscultation bilaterally GI: Abd soft, non-tender, gravid appropriate for gestational age.   No rebound or guarding. MS: Extremities nontender, no edema, normal ROM Neurologic: Alert and oriented x 4.  GU: Neg CVAT.  PELVIC EXAM: Cervix 3+ so we gave her 2 hours which then showed progress to 7 cm   FHT:  Baseline 140 , moderate variability, accelerations present, no decelerations Contractions: q 2-3 mins Irregular  Rare   Labs: No results found for this or any previous visit (from the past 24 hour(s)). O/Positive/-- (12/19 1429)  Imaging:   MAU Course/MDM: I have reviewed the triage vital signs and the nursing notes.   Pertinent labs & imaging results that were available during my care of the patient were reviewed by me and considered in my medical decision making (see chart for details).      I have reviewed her medical records including past results, notes and treatments.   NST reviewed, one single variable decel, then reassuring thereafter.   Treatments in MAU included EFM.    Assessment: Single IUP at [redacted]w[redacted]d Active Labor Desires waterbirth  Plan: Admit to Labor and Delivery Routine orders Anticipate SVD  Hansel Feinstein CNM, MSN Certified Nurse-Midwife 08/02/2021 1:38 AM

## 2021-08-02 NOTE — MAU Note (Signed)
.  Lisa Brown is a 31 y.o. at [redacted]w[redacted]d here in MAU reporting: intermittent sharp lower ABD pain that started after she walked around 2030 last night. Pt report she has a yeast infection, but has not seen any discharge since treatment started but concerned that she might be leaking fluid cause her underwear are constantly wet today. Pt denies VB, DFM, PIH s/s, recent intercourse, and complications in the pregnancy.  GBS pos SVE last Weds 3.5 cm  Onset of complaint: 2030 yesterday  Pain score: 8/10 ABD  Vitals:   08/02/21 0024  BP: 112/73  Pulse: 98  Resp: 20  Temp: 98.8 F (37.1 C)  SpO2: 99%     FHT:171 Lab orders placed from triage:

## 2021-08-02 NOTE — Discharge Summary (Signed)
Physician Discharge Summary  Patient ID: Lisa Brown MRN: [id number] DOB/AGE: Feb 14, 1990 31 y.o.  Admit date: 08/01/2021 Discharge date: 08/02/2021  Admission Diagnoses: Latent labor Braxton hicks contractions [redacted] weeks gestation of pregnancy  Discharge Diagnoses:  Principal Problem:   Gestational diabetes mellitus in pregnancy, diet controlled Active Problems:   BMI 36.0-36.9,adult   Supervision of other normal pregnancy, antepartum   ASCUS of cervix with negative high risk HPV  Discharged Condition: stable  Hospital Course: Patient was admitted from MAU to labor and delivery for active labor at 7cm. She was initially 4cm and progressed to 7cm after an hour in MAU. She desired water birth and was able to get into the tub. She was given a dose of Ampicillin for GBS prophylaxis. After 5+ hours from admission time, patient was reporting contractions had spaced out and were not as intense as they were prior to getting in tub. She was found to be asleep in the bed. On exam by CNM and was found to be 4/thick/-3. Given that cervix was unchanged, CNM discussed all options with patient and family, including ambulation/peanut ball/[employer] and re-evaluating in several hours, proceeding with IOL given that she was favorable and term, or being discharged home with close follow up in the office. Patient highly desires water birth and after discussing further with her family, she desires membrane sweep, after discussing R/B/A, and to be discharged home. A membrane sweep was performed. NST reactive and reassuring. Patient was given strict return precautions. Message was sent to [facility] for follow up OB appointment as her appointment was initially scheduled for today had been cancelled. She is scheduled for IOL on 7/20 for A1GDM. She was instructed to keep IOL appointment or return to MAU sooner for worsening symptoms.   Consults: None  Significant Diagnostic Studies: n/a  Treatments: Ampicillin x1  dose for GBS prophylaxis  Discharge Exam: Blood pressure 133/76, pulse 91, temperature 98.6 F (37 C), temperature source Oral, resp. rate 16, height $RemoveBe'5\' 11"'PAeVmYCNY$  (1.803 m), weight 127.7 kg, last menstrual period 11/02/2020, SpO2 100 %. General appearance: alert and no distress Resp: effort normal Cardio: regular rate GI: soft, nontender, gravid Neurologic: Alert and oriented X 3, normal strength and tone. Normal symmetric reflexes. Normal coordination and gait  Disposition:  Discharge disposition: 01-Home or Self Care      Discharge Instructions     Discharge patient   Complete by: As directed    Discharge disposition: 01-Home or Self Care   Discharge patient date: 08/02/2021      Allergies as of 08/02/2021   No Known Allergies      Medication List     TAKE these medications    Accu-Chek Guide test strip Generic drug: glucose blood Use 1 strip to check blood sugar 4 times daily   Accu-Chek Guide w/Device Kit Use glucose meter to monitor blood sugar 4 times daily   Accu-Chek Softclix Lancets lancets Use 1 lancet to check blood sugar 4 times daily   acetaminophen 325 MG tablet Commonly known as: Tylenol Take 2 tablets (650 mg total) by mouth every 6 (six) hours as needed.   Blood Pressure Kit Devi 1 Device by Does not apply route once a week.   clotrimazole 1 % vaginal cream Commonly known as: GYNE-LOTRIMIN Place 1 Applicatorful vaginally at bedtime for 7 days.   Magnesium 400 MG Tabs Take 400 mg by mouth daily.   Magnesium Oxide -Mg Supplement 400 MG Caps Take 400 mg by mouth daily. Increase to  twice daily if symptoms persists.   PrePLUS 27-1 MG Tabs Take 1 tablet by mouth daily.        Follow-up Information     [facility] Follow up.   Specialty: Obstetrics and Gynecology Why: someone will contact you to schedule prenatal appointment. Return to MAU sooner for worsening symptoms.  Keep scheduled induction on  08/09/2021. Contact information: [address], [place] [phone]                Signed: Renee Harder, CNM 08/02/2021, 10:30 AM

## 2021-08-02 NOTE — Progress Notes (Addendum)
Lisa Brown is a 31 y.o. G6Y4034 at [redacted]w[redacted]d admitted for labor.  Subjective: Lisa Brown is resting comfortably in the bed. She reports contractions have spaced out and are not as painful as they were previously.   Objective: BP (!) 101/46 (BP Location: Right Arm)   Pulse 98   Temp 99.3 F (37.4 C) (Oral)   Resp 16   Ht 5\' 11"  (1.803 m)   Wt 127.7 kg   LMP 11/02/2020   SpO2 100%   BMI 39.28 kg/m  No intake/output data recorded. No intake/output data recorded.  FHT: 145 bpm via doppler SVE:   Dilation: 4 Effacement (%): 70 Station: -3 Exam by:: CNM Celsey Asselin  Labs: Lab Results  Component Value Date   WBC 7.6 08/02/2021   HGB 11.2 (L) 08/02/2021   HCT 33.1 (L) 08/02/2021   MCV 83.4 08/02/2021   PLT 244 08/02/2021    Assessment / Plan: Lisa Brown is a 31 y.o. 34 at [redacted]w[redacted]d admitted for labor  Labor: Patient desires water birth. Got in tub earlier and contractions spaced out. She is resting comfortably in the bed. Cervix is 4/thick/-3 which is unchanged from exam in MAU at 0250. I discussed all options with patient which includes giving more time while patient ambulates/does [employer]/uses peanut ball, proceed with IOL, and being discharged home with close follow up in the office and coming back when contractions are closer together/more painful. Patient wishes to discuss options with significant other and doula. Will place patient on monitor for NST for the time being.  GDM: diet controlled. Will get CBG  Fetal Wellbeing:  Intermittent doppler with appropriate FHT's. Will place on monitor Pain Control:  desires water birth I/D:  GBS positive, received ampicillin x1 dose   [redacted]w[redacted]d, CNM 08/02/2021, 9:32 AM

## 2021-08-02 NOTE — H&P (Signed)
Lisa Brown is a 31 y.o. female presenting for contractions.  Denies leaking or bleeding  Pregnancy has been followed at [facility] and remarkable for  Patient Active Problem List   Diagnosis Date Noted   Unwanted fertility 06/20/2021   Gestational diabetes mellitus (GDM) in childbirth, diet controlled 05/20/2021   ASCUS of cervix with negative high risk HPV 01/10/2021   Supervision of other normal pregnancy, antepartum 12/28/2020   BMI 36.0-36.9,adult 10/06/2015   .Blood sugars have been well controlled by diet throughout pregnancy  OB History     Gravida  5   Para  1   Term  1   Preterm  0   AB  3   Living  1      SAB  1   IAB  2   Ectopic  0   Multiple  0   Live Births  1          Past Medical History:  Diagnosis Date   Asthma    Headache    Past Surgical History:  Procedure Laterality Date   NO PAST SURGERIES     Family History: family history includes Cancer in her maternal grandmother; Diabetes in her father, maternal grandfather, maternal grandmother, and mother; Heart disease in her maternal grandmother; Hypertension in her father; Kidney disease in her father; Liver disease in her father. Social History:  reports that she has never smoked. She has never used smokeless tobacco. She reports that she does not drink alcohol and does not use drugs.     Maternal Diabetes: Yes:  Diabetes Type:  Diet controlled Genetic Screening: Normal Maternal Ultrasounds/Referrals: Normal Fetal Ultrasounds or other Referrals:  None Maternal Substance Abuse:  No Significant Maternal Medications:  None Significant Maternal Lab Results:  Group B Strep positive Other Comments:  None  Review of Systems  Constitutional:  Negative for chills and fever.  Respiratory:  Negative for shortness of breath.   Gastrointestinal:  Positive for abdominal pain. Negative for constipation, diarrhea, nausea and vomiting.  Genitourinary:  Negative for vaginal bleeding.   Maternal  Medical History:  Reason for admission: Contractions.  Nausea.  Contractions: Frequency: irregular.   Perceived severity is moderate.   Fetal activity: Perceived fetal activity is normal.   Prenatal complications: No bleeding, PIH or placental abnormality.   Prenatal Complications - Diabetes: gestational. Diabetes is managed by diet.     Dilation: 4 Effacement (%): 70 Station: -3 Exam by:: Bria Torrence RN Blood pressure 136/74, pulse 99, temperature 98.8 F (37.1 C), temperature source Oral, resp. rate 20, height 5\' 11"  (1.803 m), weight 127.7 kg, last menstrual period 11/02/2020, SpO2 99 %. Maternal Exam:  Uterine Assessment: Contraction strength is moderate.  Contraction frequency is irregular.  Abdomen: Patient reports no abdominal tenderness. Fetal presentation: vertex Introitus: Normal vulva. Normal vagina.  Ferning test: not done.  Nitrazine test: not done. Pelvis: adequate for delivery.   Cervix: Cervix evaluated by digital exam.     Fetal Exam Fetal Monitor Review: Mode: ultrasound.   Baseline rate: 140.  Variability: moderate (6-25 bpm).   Pattern: accelerations present and no decelerations.   Had one single variable decel, mild  Fetal State Assessment: Category I - tracings are normal.   Physical Exam Constitutional:      General: She is not in acute distress.    Appearance: She is not ill-appearing or toxic-appearing.  HENT:     Head: Normocephalic.  Abdominal:     General: There is no distension.  Genitourinary:  General: Normal vulva.     Vagina: Normal.     Cervix: Normal.     Uterus: Normal.   Skin:    General: Skin is warm and dry.  Neurological:     General: No focal deficit present.     Mental Status: She is alert.  Psychiatric:        Mood and Affect: Mood normal.     Prenatal labs: ABO, Rh: O/Positive/-- (12/19 1429) Antibody: Negative (12/19 1429) Rubella: 1.18 (12/19 1429) RPR: Non Reactive (04/28 0957)  HBsAg: Negative (12/19  1429)  HIV: Non Reactive (04/28 0957)  GBS: Positive/-- (06/28 1350)   Assessment/Plan: Single IUP at [redacted]w[redacted]d Active Labor GBS positive  Admit to Labor and Delivery Routine orders Ancticipate SVD Discussed we can try waterbirth if she has no further decels since we cannot do continuous EFM in tub. Will watch on monitor for a while then reevaluate   Wynelle Bourgeois 08/02/2021, 4:07 AM

## 2021-08-04 ENCOUNTER — Other Ambulatory Visit: Payer: Self-pay | Admitting: Family Medicine

## 2021-08-04 DIAGNOSIS — O24410 Gestational diabetes mellitus in pregnancy, diet controlled: Secondary | ICD-10-CM

## 2021-08-07 ENCOUNTER — Ambulatory Visit (INDEPENDENT_AMBULATORY_CARE_PROVIDER_SITE_OTHER): Payer: Medicaid Other | Admitting: Obstetrics and Gynecology

## 2021-08-07 ENCOUNTER — Encounter: Payer: Self-pay | Admitting: Obstetrics and Gynecology

## 2021-08-07 VITALS — BP 109/73 | HR 98 | Wt 281.0 lb

## 2021-08-07 DIAGNOSIS — Z3A39 39 weeks gestation of pregnancy: Secondary | ICD-10-CM

## 2021-08-07 DIAGNOSIS — Z3009 Encounter for other general counseling and advice on contraception: Secondary | ICD-10-CM

## 2021-08-07 DIAGNOSIS — Z6836 Body mass index (BMI) 36.0-36.9, adult: Secondary | ICD-10-CM

## 2021-08-07 DIAGNOSIS — Z3480 Encounter for supervision of other normal pregnancy, unspecified trimester: Secondary | ICD-10-CM

## 2021-08-07 DIAGNOSIS — O24410 Gestational diabetes mellitus in pregnancy, diet controlled: Secondary | ICD-10-CM

## 2021-08-07 NOTE — Progress Notes (Signed)
   PRENATAL VISIT NOTE  Subjective:  Lisa Brown is a 31 y.o. [phone] at [redacted]w[redacted]d being seen today for ongoing prenatal care.  She is currently monitored for the following issues for this high-risk pregnancy and has BMI 36.0-36.9,adult; Supervision of other normal pregnancy, antepartum; ASCUS of cervix with negative high risk HPV; Gestational diabetes mellitus (GDM) in childbirth, diet controlled; Unwanted fertility; and Gestational diabetes mellitus in pregnancy, diet controlled on their problem list.  Patient reports no complaints.  Contractions: Irritability. Vag. Bleeding: None.  Movement: Present. Denies leaking of fluid.   The following portions of the patient's history were reviewed and updated as appropriate: allergies, current medications, past family history, past medical history, past social history, past surgical history and problem list.   Objective:   Vitals:   08/07/21 1129  BP: 109/73  Pulse: 98  Weight: 281 lb (127.5 kg)    Fetal Status: Fetal Heart Rate (bpm): 154 Fundal Height: 39 cm Movement: Present     General:  Alert, oriented and cooperative. Patient is in no acute distress.  Skin: Skin is warm and dry. No rash noted.   Cardiovascular: Normal heart rate noted  Respiratory: Normal respiratory effort, no problems with respiration noted  Abdomen: Soft, gravid, appropriate for gestational age.  Pain/Pressure: Present     Pelvic: Cervical exam performed in the presence of a chaperone Dilation: 2 Effacement (%): Thick Station: Ballotable  Extremities: Normal range of motion.  Edema: None  Mental Status: Normal mood and affect. Normal behavior. Normal judgment and thought content.   Assessment and Plan:  Pregnancy: G5P1031 at [redacted]w[redacted]d 1. Supervision of other normal pregnancy, antepartum Patient is doing well  2. Diet controlled gestational diabetes mellitus (GDM) in third trimester Patient did not bring log but reports fasting in 95 range with highest of 105 and pp are  less than 110 Patient scheduled for IOL on 7/20 but really wants a waterbirth and is considering not coming for IOL- RTC in 1 week scheduled in the event she cancels IOL Discussed complications associated with GDM and reason for IOL - patient verbalized understanding  3. Unwanted fertility Consent previously signed  4. BMI 36.0-36.9,adult   Term labor symptoms and general obstetric precautions including but not limited to vaginal bleeding, contractions, leaking of fluid and fetal movement were reviewed in detail with the patient. Please refer to After Visit Summary for other counseling recommendations.   Return in about 1 week (around 08/14/2021) for ROB, High risk.  Future Appointments  Date Time Provider Department Center  08/09/2021  7:00 AM [facility] SCHED ROOM [facility] None    Catalina Antigua, MD

## 2021-08-09 ENCOUNTER — Inpatient Hospital Stay (HOSPITAL_COMMUNITY)
Admission: RE | Admit: 2021-08-09 | Discharge: 2021-08-12 | DRG: 807 | Disposition: A | Discharge status: Home / Self Care | Payer: Medicaid Other | Attending: Obstetrics & Gynecology | Admitting: Obstetrics & Gynecology

## 2021-08-09 ENCOUNTER — Inpatient Hospital Stay (HOSPITAL_COMMUNITY): Payer: Medicaid Other

## 2021-08-09 ENCOUNTER — Other Ambulatory Visit: Payer: Self-pay

## 2021-08-09 ENCOUNTER — Encounter (HOSPITAL_COMMUNITY): Payer: Self-pay | Admitting: Family Medicine

## 2021-08-09 DIAGNOSIS — Z3A40 40 weeks gestation of pregnancy: Secondary | ICD-10-CM

## 2021-08-09 DIAGNOSIS — O9902 Anemia complicating childbirth: Secondary | ICD-10-CM | POA: Diagnosis present

## 2021-08-09 DIAGNOSIS — Z30017 Encounter for initial prescription of implantable subdermal contraceptive: Secondary | ICD-10-CM | POA: Diagnosis not present

## 2021-08-09 DIAGNOSIS — O24420 Gestational diabetes mellitus in childbirth, diet controlled: Principal | ICD-10-CM | POA: Diagnosis present

## 2021-08-09 DIAGNOSIS — O99824 Streptococcus B carrier state complicating childbirth: Secondary | ICD-10-CM | POA: Diagnosis present

## 2021-08-09 DIAGNOSIS — O24410 Gestational diabetes mellitus in pregnancy, diet controlled: Secondary | ICD-10-CM | POA: Diagnosis present

## 2021-08-09 HISTORY — DX: Gestational diabetes mellitus in pregnancy, diet controlled: O24.410

## 2021-08-09 LAB — CBC
HCT: 32.2 % — ABNORMAL LOW (ref 36.0–46.0)
Hemoglobin: 10.8 g/dL — ABNORMAL LOW (ref 12.0–15.0)
MCH: 27.8 pg (ref 26.0–34.0)
MCHC: 33.5 g/dL (ref 30.0–36.0)
MCV: 82.8 fL (ref 80.0–100.0)
Platelets: 250 10*3/uL (ref 150–400)
RBC: 3.89 MIL/uL (ref 3.87–5.11)
RDW: 14.6 % (ref 11.5–15.5)
WBC: 6.2 10*3/uL (ref 4.0–10.5)
nRBC: 0 % (ref 0.0–0.2)

## 2021-08-09 LAB — GLUCOSE, CAPILLARY: Glucose-Capillary: 95 mg/dL (ref 70–99)

## 2021-08-09 MED ORDER — MELATONIN 3 MG PO TABS
3.0000 mg | ORAL_TABLET | Freq: Every day | ORAL | Status: DC
  Filled 2021-08-09 (×2): qty 1

## 2021-08-09 MED ORDER — LACTATED RINGERS IV SOLN: 500.0000 mL | INTRAVENOUS | Status: DC | PRN

## 2021-08-09 MED ORDER — TERBUTALINE SULFATE 1 MG/ML IJ SOLN: 0.2500 mg | Freq: Once | INTRAMUSCULAR | Status: DC | PRN

## 2021-08-09 MED ORDER — OXYCODONE-ACETAMINOPHEN 5-325 MG PO TABS: 2.0000 | ORAL_TABLET | ORAL | Status: DC | PRN

## 2021-08-09 MED ORDER — MISOPROSTOL 50MCG HALF TABLET
50.0000 ug | ORAL_TABLET | ORAL | Status: DC | PRN
  Administered 2021-08-10: 50 ug via BUCCAL
  Filled 2021-08-09: qty 1

## 2021-08-09 MED ORDER — LACTATED RINGERS IV SOLN: INTRAVENOUS | Status: DC

## 2021-08-09 MED ORDER — SOD CITRATE-CITRIC ACID 500-334 MG/5ML PO SOLN: 30.0000 mL | ORAL | Status: DC | PRN

## 2021-08-09 MED ORDER — ACETAMINOPHEN 325 MG PO TABS
650.0000 mg | ORAL_TABLET | ORAL | Status: DC | PRN
  Administered 2021-08-10: 650 mg via ORAL
  Filled 2021-08-09: qty 2

## 2021-08-09 MED ORDER — PENICILLIN G POT IN DEXTROSE 60000 UNIT/ML IV SOLN
3.0000 10*6.[IU] | INTRAVENOUS | Status: DC
  Administered 2021-08-10 (×2): 3 10*6.[IU] via INTRAVENOUS
  Filled 2021-08-09 (×2): qty 50

## 2021-08-09 MED ORDER — ZOLPIDEM TARTRATE 5 MG PO TABS
5.0000 mg | ORAL_TABLET | Freq: Every evening | ORAL | Status: DC | PRN
Start: 2021-08-09 — End: 2021-08-10
  Administered 2021-08-10: 5 mg via ORAL
  Filled 2021-08-09: qty 1

## 2021-08-09 MED ORDER — OXYCODONE-ACETAMINOPHEN 5-325 MG PO TABS: 1.0000 | ORAL_TABLET | ORAL | Status: DC | PRN

## 2021-08-09 MED ORDER — LIDOCAINE HCL (PF) 1 % IJ SOLN: 30.0000 mL | INTRAMUSCULAR | Status: DC | PRN

## 2021-08-09 MED ORDER — OXYTOCIN BOLUS FROM INFUSION
333.0000 mL | Freq: Once | INTRAVENOUS | Status: AC
  Administered 2021-08-10: 333 mL via INTRAVENOUS

## 2021-08-09 MED ORDER — OXYTOCIN-SODIUM CHLORIDE 30-0.9 UT/500ML-% IV SOLN
2.5000 [IU]/h | INTRAVENOUS | Status: DC
  Administered 2021-08-10: 2.5 [IU]/h via INTRAVENOUS
  Filled 2021-08-09: qty 500

## 2021-08-09 MED ORDER — FENTANYL CITRATE (PF) 100 MCG/2ML IJ SOLN: 50.0000 ug | INTRAMUSCULAR | Status: DC | PRN

## 2021-08-09 MED ORDER — SODIUM CHLORIDE 0.9 % IV SOLN
5.0000 10*6.[IU] | Freq: Once | INTRAVENOUS | Status: AC
  Administered 2021-08-09: 5 10*6.[IU] via INTRAVENOUS
  Filled 2021-08-09: qty 5

## 2021-08-09 MED ORDER — ONDANSETRON HCL 4 MG/2ML IJ SOLN
4.0000 mg | Freq: Four times a day (QID) | INTRAMUSCULAR | Status: DC | PRN
  Administered 2021-08-10: 4 mg via INTRAVENOUS
  Filled 2021-08-09: qty 2

## 2021-08-09 NOTE — H&P (Signed)
MARCINE GADWAY is a 31 y.o. female [phone] with IUP at 35w0dpresenting for IOL for A1DM. [facility] [employer] Prenatal History/Complications:   Term waterbirth w/o problems  Past Medical History: Past Medical History:  Diagnosis Date   Asthma    Headache     Past Surgical History: Past Surgical History:  Procedure Laterality Date   NO PAST SURGERIES      Obstetrical History: OB History     Gravida  5   Para  1   Term  1   Preterm  0   AB  3   Living  1      SAB  1   IAB  2   Ectopic  0   Multiple  0   Live Births  1           Social History: Social History   Socioeconomic History   Marital status: Single    Spouse name: Darius   Number of children: 1   Years of education: 15   Highest education level: Not on file  Occupational History   Not on file  Tobacco Use   Smoking status: Never   Smokeless tobacco: Never  Vaping Use   Vaping Use: Never used  Substance and Sexual Activity   Alcohol use: No   Drug use: No   Sexual activity: Yes    Birth control/protection: None    Comment: Hx of BCPs, and implanon  Other Topics Concern   Not on file  Social History Narrative   Right handed   Drinks caffeine   Town house   Social Determinants of Health   Financial Resource Strain: Not on file  Food Insecurity: No Food Insecurity (08/29/2020)   Hunger Vital Sign    Worried About Running Out of Food in the Last Year: Never true    Ran Out of Food in the Last Year: Never true  Transportation Needs: Unmet Transportation Needs (08/29/2020)   PRAPARE - THydrologist(Medical): No    Lack of Transportation (Non-Medical): Yes  Physical Activity: Not on file  Stress: Not on file  Social Connections: Not on file    Family History: Family History  Problem Relation Age of Onset   Diabetes Mother    Diabetes Father    Kidney disease Father    Hypertension Father    Liver disease Father    Cancer Maternal Grandfather     Diabetes Maternal Grandfather    Hypertension Paternal Grandmother     Allergies: No Known Allergies  Medications Prior to Admission  Medication Sig Dispense Refill Last Dose   Magnesium 400 MG TABS Take 400 mg by mouth daily. 90 tablet 2 Past Month   Prenatal Vit-Fe Fumarate-FA (PREPLUS) 27-1 MG TABS Take 1 tablet by mouth daily. 30 tablet 13 Past Week   Accu-Chek Softclix Lancets lancets Use 1 lancet to check blood sugar 4 times daily (Patient not taking: Reported on 07/18/2021) 100 each 6    acetaminophen (TYLENOL) 325 MG tablet Take 2 tablets (650 mg total) by mouth every 6 (six) hours as needed. (Patient not taking: Reported on 07/02/2021) 60 tablet 0 More than a month   Blood Glucose Monitoring Suppl (ACCU-CHEK GUIDE) w/Device KIT Use glucose meter to monitor blood sugar 4 times daily (Patient not taking: Reported on 07/18/2021) 1 kit 0    Blood Pressure Monitoring (BLOOD PRESSURE KIT) DEVI 1 Device by Does not apply route once a week. (Patient not  taking: Reported on 07/18/2021) 1 each 0    glucose blood (ACCU-CHEK GUIDE) test strip Use 1 strip to check blood sugar 4 times daily (Patient not taking: Reported on 07/18/2021) 100 each 6    Magnesium Oxide -Mg Supplement 400 MG CAPS Take 400 mg by mouth daily. Increase to twice daily if symptoms persists. (Patient not taking: Reported on 07/18/2021) 60 capsule 1         Review of Systems   Constitutional: Negative for fever and chills Eyes: Negative for visual disturbances Respiratory: Negative for shortness of breath, dyspnea Cardiovascular: Negative for chest pain or palpitations  Gastrointestinal: Negative for abdominal pain, vomiting, diarrhea and constipation.   Genitourinary: Negative for dysuria and urgency Musculoskeletal: Negative for back pain, joint pain, myalgias  Neurological: Negative for dizziness and headaches      Blood pressure 123/80, pulse 89, last menstrual period 11/02/2020. General appearance: alert,  cooperative, and no distress Lungs: normal respiratory effort Heart: regular rate and rhythm Abdomen: soft, non-tender; bowel sounds normal Extremities: Homans sign is negative, no sign of DVT DTR's 2+ Presentation: cephalic Fetal monitoring  Baseline: 140 bpm, Variability: Good {> 6 bpm), Accelerations: Reactive, and Decelerations: Absent Uterine activity  mild and irregular  Dilation: 3 Station: -2 Exam by:: Manus Gunning C-D, CNM   Prenatal labs: ABO, Rh: --/--/PENDING (07/20 2218) Antibody: PENDING (07/20 2218) Rubella: immune RPR: NON REACTIVE (07/13 0408)  HBsAg: Negative (12/19 1429)  HIV: Non Reactive (04/28 0957)  GBS: Positive/-- (06/28 1350)  2 hr Glucola normla Genetic screening  normal Anatomy US normal;  EFW 59% 4 weeks ago  Prenatal Transfer Tool  Maternal Diabetes: Yes:  Diabetes Type:  Diet controlled Genetic Screening: Normal Maternal Ultrasounds/Referrals: Normal Fetal Ultrasounds or other Referrals:  None Maternal Substance Abuse:  No Significant Maternal Medications:  None Significant Maternal Lab Results: Group B Strep positive    Results for orders placed or performed during the hospital encounter of 08/09/21 (from the past 24 hour(s))  Type and screen   Collection Time: 08/09/21 10:18 PM  Result Value Ref Range   ABO/RH(D) PENDING    Antibody Screen PENDING    Sample Expiration      08/12/2021,2359 Performed at [facility], [address] [place], [place] [address]   CBC   Collection Time: 08/09/21 10:30 PM  Result Value Ref Range   WBC 6.2 4.0 - 10.5 K/uL   RBC 3.89 3.87 - 5.11 MIL/uL   Hemoglobin 10.8 (L) 12.0 - 15.0 g/dL   HCT 32.2 (L) 36.0 - 46.0 %   MCV 82.8 80.0 - 100.0 fL   MCH 27.8 26.0 - 34.0 pg   MCHC 33.5 30.0 - 36.0 g/dL   RDW 14.6 11.5 - 15.5 %   Platelets 250 150 - 400 K/uL   nRBC 0.0 0.0 - 0.2 %    Assessment: JULIO STORR is a 31 y.o. [id number] with an IUP at 46w0dpresenting for IOL for A1DM.  Plan: #Labor: plans  waterbirth.  Start with cytotec #Pain:  Per request #FWB Cat 1 #ID: GBS: PCN  #MOF:  breast #MOC: BTL    FChristin Fudge7/20/2023, 11:23 PM

## 2021-08-10 ENCOUNTER — Encounter (HOSPITAL_COMMUNITY): Payer: Self-pay | Admitting: Family Medicine

## 2021-08-10 DIAGNOSIS — O24420 Gestational diabetes mellitus in childbirth, diet controlled: Secondary | ICD-10-CM

## 2021-08-10 DIAGNOSIS — Z3A40 40 weeks gestation of pregnancy: Secondary | ICD-10-CM

## 2021-08-10 DIAGNOSIS — O99824 Streptococcus B carrier state complicating childbirth: Secondary | ICD-10-CM

## 2021-08-10 LAB — GLUCOSE, CAPILLARY
Glucose-Capillary: 158 mg/dL — ABNORMAL HIGH (ref 70–99)
Glucose-Capillary: 105 mg/dL — ABNORMAL HIGH (ref 70–99)

## 2021-08-10 LAB — TYPE AND SCREEN
ABO/RH(D): O POS
Antibody Screen: NEGATIVE

## 2021-08-10 LAB — RPR: RPR Ser Ql: NONREACTIVE

## 2021-08-10 MED ORDER — DIBUCAINE (PERIANAL) 1 % EX OINT: 1.0000 | TOPICAL_OINTMENT | CUTANEOUS | Status: DC | PRN

## 2021-08-10 MED ORDER — IBUPROFEN 600 MG PO TABS
600.0000 mg | ORAL_TABLET | Freq: Four times a day (QID) | ORAL | Status: DC
  Administered 2021-08-10 – 2021-08-12 (×8): 600 mg via ORAL
  Filled 2021-08-10 (×8): qty 1

## 2021-08-10 MED ORDER — WITCH HAZEL-GLYCERIN EX PADS: 1.0000 | MEDICATED_PAD | CUTANEOUS | Status: DC | PRN

## 2021-08-10 MED ORDER — SENNOSIDES-DOCUSATE SODIUM 8.6-50 MG PO TABS
2.0000 | ORAL_TABLET | ORAL | Status: DC
  Administered 2021-08-11: 2 via ORAL
  Filled 2021-08-10: qty 2

## 2021-08-10 MED ORDER — ZOLPIDEM TARTRATE 5 MG PO TABS: 5.0000 mg | ORAL_TABLET | Freq: Every evening | ORAL | Status: DC | PRN

## 2021-08-10 MED ORDER — SIMETHICONE 80 MG PO CHEW: 80.0000 mg | CHEWABLE_TABLET | ORAL | Status: DC | PRN

## 2021-08-10 MED ORDER — DIPHENHYDRAMINE HCL 25 MG PO CAPS: 25.0000 mg | ORAL_CAPSULE | Freq: Four times a day (QID) | ORAL | Status: DC | PRN

## 2021-08-10 MED ORDER — FERROUS SULFATE 325 (65 FE) MG PO TABS
325.0000 mg | ORAL_TABLET | Freq: Two times a day (BID) | ORAL | Status: DC
  Administered 2021-08-10 – 2021-08-12 (×4): 325 mg via ORAL
  Filled 2021-08-10 (×4): qty 1

## 2021-08-10 MED ORDER — ONDANSETRON HCL 4 MG/2ML IJ SOLN: 4.0000 mg | INTRAMUSCULAR | Status: DC | PRN

## 2021-08-10 MED ORDER — BENZOCAINE-MENTHOL 20-0.5 % EX AERO
1.0000 | INHALATION_SPRAY | CUTANEOUS | Status: DC | PRN
  Administered 2021-08-10: 1 via TOPICAL
  Filled 2021-08-10: qty 56

## 2021-08-10 MED ORDER — PRENATAL MULTIVITAMIN CH
1.0000 | ORAL_TABLET | Freq: Every day | ORAL | Status: DC
  Administered 2021-08-11 – 2021-08-12 (×2): 1 via ORAL
  Filled 2021-08-10 (×2): qty 1

## 2021-08-10 MED ORDER — TETANUS-DIPHTH-ACELL PERTUSSIS 5-2.5-18.5 LF-MCG/0.5 IM SUSY: 0.5000 mL | PREFILLED_SYRINGE | Freq: Once | INTRAMUSCULAR | Status: DC

## 2021-08-10 MED ORDER — ONDANSETRON HCL 4 MG PO TABS: 4.0000 mg | ORAL_TABLET | ORAL | Status: DC | PRN

## 2021-08-10 MED ORDER — COCONUT OIL OIL: 1.0000 | TOPICAL_OIL | Status: DC | PRN

## 2021-08-10 MED ORDER — ACETAMINOPHEN 325 MG PO TABS
650.0000 mg | ORAL_TABLET | ORAL | Status: DC | PRN
  Administered 2021-08-10 – 2021-08-11 (×3): 650 mg via ORAL
  Filled 2021-08-10 (×3): qty 2

## 2021-08-10 NOTE — Lactation Note (Addendum)
This note was copied from a baby's chart. Lactation Consultation Note  Patient Name: Lisa Brown [id number] Date: 08/10/2021 Reason for consult: L&D Initial assessment Age:32 hours  P2, Baby latched with ease.  Lactation to follow up on MBU.  Maternal Data Does the patient have breastfeeding experience prior to this delivery?: Yes How long did the patient breastfeed?: 10 mos.  Feeding Mother's Current Feeding Choice: Breast Milk  LATCH Score Latch: Grasps breast easily, tongue down, lips flanged, rhythmical sucking.  Audible Swallowing: A few with stimulation  Type of Nipple: Everted at rest and after stimulation  Comfort (Breast/Nipple): Soft / non-tender  Hold (Positioning): Assistance needed to correctly position infant at breast and maintain latch.  LATCH Score: 8   Interventions Interventions: Skin to skin;Education  Discharge    Consult Status Consult Status: Follow-up from L&D    Dahlia Byes [facility] 08/10/2021, 1:34 PM

## 2021-08-10 NOTE — Progress Notes (Addendum)
Patient ID: Lisa Brown, female   DOB: 07-19-1990, 31 y.o.   MRN: [id number]  Subjective: -Care assumed of 31 y.o. [id number] at [redacted]w[redacted]d who presents for IOL. Pregnancy and medical history significant for GDM-A1, GBS Positive.  In room to meet acquaintance of patient and family.  Patient reports some discomfort with contractions, but coping well.  She states she is ready to get in WB tub. Patient with some anxiety regarding tubal procedure.    Objective: BP 119/71   Pulse 94   Temp 97.9 F (36.6 C) (Axillary)   Resp 18   Ht 5\' 11"  (1.803 m)   Wt 128.2 kg   LMP 11/02/2020   BMI 39.41 kg/m  No intake/output data recorded. No intake/output data recorded.  Fetal Monitoring: FHT: 135 bpm, Mod Var, -Decels, +Accels UC: Palpates moderate    Vaginal Exam: SVE:   Dilation: 4 Effacement (%): 60 Station: -2 Exam by:: [email or web address], RN Membranes:AROM with moderate amt clear fluid Internal Monitors: None  Augmentation/Induction: Pitocin:None Cytotec: S/P  Assessment:  IUP at 40.1 Cat I FT  IOL d/t GDMA1 Desires WB GBS Positive Amniotomy  Plan: -Reviewed WB risks and consents signed. -Tub filled and ready. -Encouraged to ask questions, regarding anesthesia for tubal, to performing MD. Briefly reviewed general and spinal anesthesia, but cautioned that provider not sure which method is utilized when no epidural in place. -Further encouraged to not be anxious about procedure and that she can refuse if no longer desiring.  -Discussed AROM r/b including increased risk of infection, cord prolapse, fetal intolerance, and decreased labor time. No questions or concerns and patient desires to proceed with AROM.  -AROM performed without incident. -Okay to get in tub if desired.  Fredia Beets, CNM Advanced Practice Provider, Center for [facility] 08/10/2021, 9:43 AM

## 2021-08-10 NOTE — Discharge Summary (Signed)
Postpartum Discharge Summary  Date of Service updated***     Patient Name: Lisa Brown DOB: 1990-04-11 MRN: [id number]  Date of admission: 08/09/2021 Delivery date:08/10/2021  Delivering provider: Gavin Pound  Date of discharge: 08/10/2021  Admitting diagnosis: GDM, class A1 [O24.410] Intrauterine pregnancy: [redacted]w[redacted]d     Secondary diagnosis:  Principal Problem:   Vaginal delivery Active Problems:   GDM, class A1  Additional problems: ***    Discharge diagnosis: {DX.:23714}                                              Post partum procedures:{Postpartum procedures:23558} Augmentation: N/A Complications: None  Hospital course: Induction of Labor With Vaginal Delivery   31 y.o. yo [id number] at [redacted]w[redacted]d was admitted to the hospital 08/09/2021 for induction of labor. She was given one dose of cytotec and progressed without further medication.  At 8cm she reentered the waterbirth tub and was encouraged to push when the urge arised.  She delivered without incident.  Indication for induction: A1 DM.  Patient had an uncomplicated labor course as follows: Membrane Rupture Time/Date: 10:03 AM ,08/10/2021   Delivery Method:Vaginal, Spontaneous  Episiotomy: None  Lacerations:  None  Details of delivery can be found in separate delivery note.  Patient had a routine postpartum course. Patient is discharged home 08/10/21.  Newborn Data: Birth date:08/10/2021  Birth time:12:46 PM  Gender:Female  Living status:Living -Lisa Brown:4 ,6  Weight:3650 g   Magnesium Sulfate received: No BMZ received: No Rhophylac:No MMR:No T-DaP:Given prenatally Flu: N/A Transfusion:No  Physical exam  Vitals:   08/10/21 1417 08/10/21 1432 08/10/21 1520 08/10/21 1650  BP: 125/76 132/82 127/74 122/70  Pulse: 89 89 80 75  Resp:   18 18  Temp:   98 F (36.7 C) 98.5 F (36.9 C)  TempSrc:   Oral   SpO2:   99% 98%  Weight:      Height:       General: {Exam; general:21111117} Lochia: {Desc;  appropriate/inappropriate:30686::"appropriate"} Uterine Fundus: {Desc; firm/soft:30687} Incision: {Exam; incision:21111123} DVT Evaluation: {Exam; dvt:2111122} Labs: Lab Results  Component Value Date   WBC 6.2 08/09/2021   HGB 10.8 (L) 08/09/2021   HCT 32.2 (L) 08/09/2021   MCV 82.8 08/09/2021   PLT 250 08/09/2021      Latest Ref Rng & Units 04/05/2021    9:47 AM  CMP  Glucose 70 - 99 mg/dL 115   BUN 6 - 20 mg/dL 5   Creatinine 0.44 - 1.00 mg/dL 0.65   Sodium 135 - 145 mmol/L 136   Potassium 3.5 - 5.1 mmol/L 4.2   Chloride 98 - 111 mmol/L 104   CO2 22 - 32 mmol/L 22   Calcium 8.9 - 10.3 mg/dL 8.7   Total Protein 6.5 - 8.1 g/dL 6.2   Total Bilirubin 0.3 - 1.2 mg/dL 0.3   Alkaline Phos 38 - 126 U/L 39   AST 15 - 41 U/L 18   ALT 0 - 44 U/L 24    Edinburgh Score:    08/10/2021    4:50 PM  Edinburgh Postnatal Depression Scale Screening Tool  I have been able to laugh and see the funny side of things. 0  I have looked forward with enjoyment to things. 0  I have blamed myself unnecessarily when things went wrong. 0  I have been anxious or worried for no  good reason. 2  I have felt scared or panicky for no good reason. 0  Things have been getting on top of me. 0  I have been so unhappy that I have had difficulty sleeping. 0  I have felt sad or miserable. 0  I have been so unhappy that I have been crying. 0  The thought of harming myself has occurred to me. 0  Edinburgh Postnatal Depression Scale Total 2     After visit meds:  Allergies as of 08/10/2021   No Known Allergies   Med Rec must be completed prior to using this [facility]***        Discharge home in stable condition Infant Feeding: {Baby feeding:23562} Infant Disposition:{CHL IP OB HOME WITH ETUYWS:39795} Discharge instruction: per After Visit Summary and Postpartum booklet. Activity: Advance as tolerated. Pelvic rest for 6 weeks.  Diet: {OB FKVQ:23009794} Future Appointments:No future  appointments. Follow up Visit: Message sent 08/10/2021 by Milinda Cave, CNM   Please schedule this patient for a In person postpartum visit in 6 weeks with the following provider: Any provider. Additional Postpartum F/U:2 hour GTT  Low risk pregnancy complicated by: GDM Delivery mode:  Vaginal, Spontaneous  Anticipated Birth Control:  BTL done PP   08/10/2021 Maryann Conners, CNM

## 2021-08-10 NOTE — Progress Notes (Signed)
Spoke to T/S provider, pt unsure if going for BTL tomorrow, 7/22, as she was told she could eat breakfast.  Per provider, they will schedule her BTL for 7/22, instructed to keep patient NPO after midnight.

## 2021-08-10 NOTE — Lactation Note (Signed)
This note was copied from a baby's chart. Lactation Consultation Note  Patient Name: Lisa Brown [id number] Date: 08/10/2021 Reason for consult: Initial assessment;Mother's request;Term;Breastfeeding assistance;Other (Comment) (Ferrous Sulfate) Age:32 hours  LC assisted with latching infant at the breast in football with signs of milk transfer.   Mom requested a manual pump for extra stimulation.   Plan 1. To feed based on cues 8-12x 24hr period. Mom to offer breasts and look for signs of milk transfer.  2. Mom to hand express and or pump offer colostrum via spoon 5-7 ml per feeding.  3. Post pump after feeding with manual pump for 10 min each breast.  All questions answered at the end of the visit.   Maternal Data Has patient been taught Hand Expression?: Yes Does the patient have breastfeeding experience prior to this delivery?: Yes How long did the patient breastfeed?: 10 months 5 years ago  Feeding Mother's Current Feeding Choice: Breast Milk  LATCH Score Latch: Repeated attempts needed to sustain latch, nipple held in mouth throughout feeding, stimulation needed to elicit sucking reflex.  Audible Swallowing: Spontaneous and intermittent  Type of Nipple: Everted at rest and after stimulation  Comfort (Breast/Nipple): Soft / non-tender  Hold (Positioning): Assistance needed to correctly position infant at breast and maintain latch.  LATCH Score: 8   Lactation Tools Discussed/Used Tools: Pump;Flanges Flange Size: 27 Breast pump type: Manual (Maternal request) Pump Education: Setup, frequency, and cleaning;Milk Storage Reason for Pumping: increase stimulation Pumping frequency: post pump after each feeding for 10 mins each breast  Interventions Interventions: Breast feeding basics reviewed;Assisted with latch;Skin to skin;Breast massage;Hand express;Breast compression;Adjust position;Support pillows;Position options;Expressed milk;Hand pump;Education;LC Economist;Infant Driven Feeding Algorithm education;Pace feeding  Discharge WIC Program: Yes  Consult Status Consult Status: Follow-up Date: 08/11/21 Follow-up type: In-patient    Geofrey Silliman  [facility] 08/10/2021, 5:14 PM

## 2021-08-10 NOTE — Progress Notes (Signed)
Patient ID: [name], female   DOB: 02-02-1990, 31 y.o.   MRN: [id number]  Subjective: -Patient requesting cervical exam.  Patient with vomiting. Family and Doula at bedside, supportive.   Objective: BP 119/71   Pulse 94   Temp 97.9 F (36.6 C) (Axillary)   Resp 18   Ht 5\' 11"  (1.803 m)   Wt 128.2 kg   LMP 11/02/2020   BMI 39.41 kg/m  No intake/output data recorded. No intake/output data recorded.  Fetal Monitoring: FHT: 133 bpm by doppler UC: Palpates moderate    Vaginal Exam: SVE:   Dilation: 6 Effacement (%): 80 Station: -2 Exam by:: J.Daviona Herbert, CNM Membranes:AROM x 1.5hr Internal Monitors: None  Augmentation/Induction: Pitocin:None Cytotec: S/P One Dose  Assessment:  IUP at 40.1 weeks Cat I FT  Active Labor  Plan: -VE performed. -Patient offered and accepts antiemetic. Zofran given. -In tub as tolerated. -Continue other mgmt as ordered   [email or web address], CNM Advanced Practice Provider, Center for [facility] 08/10/2021, 11:32 AM

## 2021-08-11 ENCOUNTER — Encounter (HOSPITAL_COMMUNITY): Payer: Self-pay | Admitting: Anesthesiology

## 2021-08-11 ENCOUNTER — Encounter (HOSPITAL_COMMUNITY)
Admission: RE | Disposition: A | Discharge status: Home / Self Care | Payer: Self-pay | Source: Home / Self Care | Attending: Obstetrics & Gynecology

## 2021-08-11 LAB — CBC
HCT: 27.6 % — ABNORMAL LOW (ref 36.0–46.0)
Hemoglobin: 9.5 g/dL — ABNORMAL LOW (ref 12.0–15.0)
MCH: 28.6 pg (ref 26.0–34.0)
MCHC: 34.4 g/dL (ref 30.0–36.0)
MCV: 83.1 fL (ref 80.0–100.0)
Platelets: 221 10*3/uL (ref 150–400)
RBC: 3.32 MIL/uL — ABNORMAL LOW (ref 3.87–5.11)
RDW: 14.7 % (ref 11.5–15.5)
WBC: 10.5 10*3/uL (ref 4.0–10.5)
nRBC: 0 % (ref 0.0–0.2)

## 2021-08-11 LAB — GLUCOSE, RANDOM: Glucose, Bld: 163 mg/dL — ABNORMAL HIGH (ref 70–99)

## 2021-08-11 SURGERY — LIGATION, FALLOPIAN TUBE, POSTPARTUM
Anesthesia: Choice | Laterality: Bilateral

## 2021-08-11 MED ORDER — LACTATED RINGERS IV SOLN: INTRAVENOUS | Status: DC

## 2021-08-11 MED ORDER — FAMOTIDINE 20 MG PO TABS: 40.0000 mg | ORAL_TABLET | Freq: Once | ORAL | Status: DC

## 2021-08-11 MED ORDER — OXYCODONE HCL 5 MG PO TABS
5.0000 mg | ORAL_TABLET | Freq: Once | ORAL | Status: AC
  Administered 2021-08-11: 5 mg via ORAL
  Filled 2021-08-11: qty 1

## 2021-08-11 MED ORDER — METOCLOPRAMIDE HCL 10 MG PO TABS: 10.0000 mg | ORAL_TABLET | Freq: Once | ORAL | Status: DC

## 2021-08-11 NOTE — Progress Notes (Signed)
Pt. States she does not want to proceed with having a BTL. She request to have Nexplanon placed instead. Provider made aware.   Tylene Fantasia, RN

## 2021-08-11 NOTE — Progress Notes (Signed)
POSTPARTUM PROGRESS NOTE  Subjective: Lisa Brown is a 31 y.o. [id number] s/p SVD at [redacted]w[redacted]d.  She reports she doing well. No acute events overnight. She denies any problems with ambulating, voiding or po intake. Denies nausea or vomiting. She has passed flatus. Pain is well controlled.  Lochia is appropriate.  Objective: Blood pressure (!) 108/55, pulse 83, temperature (!) 97.5 F (36.4 C), temperature source Oral, resp. rate 18, height 5\' 11"  (1.803 m), weight 128.2 kg, last menstrual period 11/02/2020, SpO2 98 %, unknown if currently breastfeeding.  Physical Exam:  General: alert, cooperative and no distress Chest: no respiratory distress Abdomen: soft, non-tender  Uterine Fundus: firm and at level of umbilicus Extremities: No calf swelling or tenderness  no edema  Recent Labs    08/09/21 2230 08/11/21 0446  HGB 10.8* 9.5*  HCT 32.2* 27.6*    Assessment/Plan: Lisa Brown is a 31 y.o. 34 s/p SVD at [redacted]w[redacted]d  Routine Postpartum Care: Doing well, pain well-controlled.  -- Continue routine care, lactation support  -- Contraception: BTL (medicaid - papers 4/28) -- Feeding: breast  BTL: she ate ~0300 last night. She would like to get her BTL before leaving the hospital.  Anemia: cont PO iron every other day  Dispo: Plan for discharge PPD#2 s/p BTL.  5/28, DO Faculty Practice, Center for [facility] 08/11/2021 7:29 AM

## 2021-08-12 DIAGNOSIS — Z30017 Encounter for initial prescription of implantable subdermal contraceptive: Secondary | ICD-10-CM

## 2021-08-12 MED ORDER — LIDOCAINE HCL 1 % IJ SOLN
0.0000 mL | Freq: Once | INTRAMUSCULAR | Status: AC | PRN
  Administered 2021-08-12: 3 mL via INTRADERMAL
  Filled 2021-08-12: qty 20

## 2021-08-12 MED ORDER — ETONOGESTREL 68 MG ~~LOC~~ IMPL
68.0000 mg | DRUG_IMPLANT | Freq: Once | SUBCUTANEOUS | Status: AC
  Administered 2021-08-12: 68 mg via SUBCUTANEOUS
  Filled 2021-08-12: qty 1

## 2021-08-12 MED ORDER — HYDROXYZINE HCL 10 MG PO TABS: 10.0000 mg | ORAL_TABLET | Freq: Three times a day (TID) | ORAL | 0 refills | Status: AC | PRN

## 2021-08-12 NOTE — Procedures (Addendum)
Nexplanon Insertion Procedure Patient identified, informed consent performed, consent signed.   Patient does understand that irregular bleeding is a very common side effect of this medication. She was advised to have backup contraception for one week after placement. Pregnancy test in clinic today was negative.  Appropriate time out taken.  Patient's left arm was prepped and draped in the usual sterile fashion. The ruler used to measure and mark insertion area.  Patient was prepped with alcohol swab and then injected with 3 ml of 1% lidocaine.  She was prepped with betadine, Nexplanon removed from packaging,  Device confirmed in needle, then inserted full length of needle and withdrawn per handbook instructions. Nexplanon was able to palpated in the patient's arm; patient palpated the insert herself. There was minimal blood loss.  Patient insertion site covered with guaze and a pressure bandage to reduce any bruising.  The patient tolerated the procedure well and was given post procedure instructions.    VA:[id number] EXP 2025 May 23 LOT [id number]

## 2021-08-12 NOTE — Lactation Note (Signed)
This note was copied from a baby's chart. Lactation Consultation Note  Patient Name: Lisa Brown [id number] Date: 08/12/2021 Reason for consult: Follow-up assessment;Infant weight loss;Term;Breastfeeding assistance (3.56% WL) Age:32 hours  P2, Term, Infant Female, 3.56% WL  Infant at 46 hours. LC entered the room and baby was in the bassinet. Per mom, breastfeeding is going well. Mom says that baby latches well and denies any pain.   LC spoke with mom about engorgement, pumping, warning signs, infant I/O, and outpatient LC services.   Mom states that she has no questions or concerns.   Current Feeding Plan:  Breastfeed according to feeding cues 8+ times in 24 hours.  Watch infant output.  Call outpatient [facility] for breastfeeding support.   Interventions Interventions: Breast feeding basics reviewed;Education  Discharge Discharge Education: Engorgement and breast care;Warning signs for feeding baby;Outpatient recommendation  Consult Status Consult Status: Complete Date: 08/12/21 Follow-up type: Call as needed    Delene Loll 08/12/2021, 12:08 PM

## 2021-08-12 NOTE — Progress Notes (Signed)
While this nurse was in room, pt received a phone call with news of her close friend passing. Pt upset, FOB present to help comfort. Pt asked nurse to call MD to request anxiet medication, stated she feels this may cause her to have a panic attack. MD called and made aware of situation.   Tylene Fantasia, RN

## 2021-08-12 NOTE — Progress Notes (Signed)
Circumcision Consent  Discussed with mom at bedside about circumcision.   Circumcision is a surgery that removes the skin that covers the tip of the penis, called the "foreskin." Circumcision is usually done when a boy is between 1 and 10 days old, sometimes up to 3-4 weeks old.  The most common reasons boys are circumcised include for cultural/religious beliefs or for parental preference (potentially easier to clean, so baby looks like daddy, etc).  There may be some medical benefits for circumcision:   Circumcised boys seem to have slightly lower rates of: ? Urinary tract infections (per the American Academy of Pediatrics an uncircumcised boy has a 1/100 chance of developing a UTI in the first year of life, a circumcised boy at a 01/998 chance of developing a UTI in the first year of life- a 10% reduction) ? Penis cancer (typically rare- an uncircumcised female has a 1 in 100,000 chance of developing cancer of the penis) ? Sexually transmitted infection (in endemic areas, including HIV, HPV and Herpes- circumcision does NOT protect against gonorrhea, chlamydia, trachomatis, or syphilis) ? Phimosis: a condition where that makes retraction of the foreskin over the glans impossible (0.4 per 1000 boys per year or 0.6% of boys are affected by their 15th birthday)  Boys and men who are not circumcised can reduce these extra risks by: ? Cleaning their penis well ? Using condoms during sex  What are the risks of circumcision?  As with any surgical procedure, there are risks and complications. In circumcision, complications are rare and usually minor, the most common being: ? Bleeding- risk is reduced by holding each clamp for 30 seconds prior to a cut being made, and by holding pressure after the procedure is done ? Infection- the penis is cleaned prior to the procedure, and the procedure is done under sterile technique ? Damage to the urethra or amputation of the penis  How is circumcision done  in baby boys?  The baby will be placed on a special table and the legs restrained for their safety. Numbing medication is injected into the penis, and the skin is cleansed with betadine to decrease the risk of infection.   What to expect:  The penis will look red and raw for 5-7 days as it heals. We expect scabbing around where the cut was made, as well as clear-pink fluid and some swelling of the penis right after the procedure. If your baby's circumcision starts to bleed or develops pus, please contact your pediatrician immediately.  All questions were answered and mother consented.  Mariene Dickerman V Sanjuana Mruk, MD 10:15 AM  

## 2021-08-17 ENCOUNTER — Telehealth (HOSPITAL_COMMUNITY): Payer: Self-pay

## 2021-08-17 NOTE — Telephone Encounter (Signed)
Patient did not answer phone call. Voicemail left for patient.   Lisa Brown [facility] 08/17/2021,1742

## 2021-09-26 ENCOUNTER — Ambulatory Visit (INDEPENDENT_AMBULATORY_CARE_PROVIDER_SITE_OTHER): Payer: Medicaid Other | Admitting: Obstetrics & Gynecology

## 2021-09-26 ENCOUNTER — Other Ambulatory Visit: Payer: Medicaid Other

## 2021-09-26 DIAGNOSIS — Z8632 Personal history of gestational diabetes: Secondary | ICD-10-CM

## 2021-09-26 DIAGNOSIS — K649 Unspecified hemorrhoids: Secondary | ICD-10-CM

## 2021-09-26 DIAGNOSIS — Z3009 Encounter for other general counseling and advice on contraception: Secondary | ICD-10-CM | POA: Diagnosis not present

## 2021-09-26 MED ORDER — HYDROCORTISONE (PERIANAL) 2.5 % EX CREA: TOPICAL_CREAM | Freq: Two times a day (BID) | CUTANEOUS | 2 refills | Status: AC

## 2021-09-26 NOTE — Progress Notes (Signed)
Post Partum Visit Note  Lisa Brown is a 31 y.o. [phone] female who presents for a postpartum visit. She is 6 weeks postpartum following a normal spontaneous vaginal delivery.  I have fully reviewed the prenatal and intrapartum course. The delivery was at 40.1 gestational weeks.  Anesthesia: none. Postpartum course has been. Baby is doing well. Baby is feeding by breast. Bleeding no bleeding. Bowel function is normal. Bladder function is normal. Patient is not sexually active. Contraception method is Nexplanon, placed 08/12/2021. She wants to discuss tubal sterilization.  Postpartum depression screening: negative.  Patient also reports symptomatic hemorrhoids, wants medication to help with this.   Edinburgh Postnatal Depression Scale - 09/26/21 0939       Edinburgh Postnatal Depression Scale:  In the Past 7 Days   I have been able to laugh and see the funny side of things. 0    I have looked forward with enjoyment to things. 0    I have blamed myself unnecessarily when things went wrong. 0    I have been anxious or worried for no good reason. 0    I have felt scared or panicky for no good reason. 0    Things have been getting on top of me. 1    I have been so unhappy that I have had difficulty sleeping. 0    I have felt sad or miserable. 0    I have been so unhappy that I have been crying. 0    The thought of harming myself has occurred to me. 0    Edinburgh Postnatal Depression Scale Total 1            Health Maintenance Due  Topic Date Due   COVID-19 Vaccine (1) Never done   URINE MICROALBUMIN  Never done   INFLUENZA VACCINE  08/21/2021   The following portions of the patient's history were reviewed and updated as appropriate: allergies, current medications, past family history, past medical history, past social history, past surgical history, and problem list.  Review of Systems Pertinent items noted in HPI and remainder of comprehensive ROS otherwise  negative.  Objective:  LMP 11/02/2020    General:  alert and no distress   Breasts:  normal  Lungs: clear to auscultation bilaterally  Heart:  regular rate and rhythm  Abdomen: soft, non-tender; bowel sounds normal; no masses,  no organomegaly   GU exam:  not indicated       Assessment:   1. Postpartum care following vaginal delivery 2. History of gestational diabetes 3. Hemorrhoids, unspecified hemorrhoid type 4. Consultation for female sterilization  Plan:   Essential components of care per ACOG recommendations:  1.  Mood and well being: Patient with negative depression screening today. Reviewed local resources for support.  - Patient tobacco use? No.   - hx of drug use? No.    2. Infant care and feeding:  -Patient currently breastmilk feeding? Yes, no issues. -Social determinants of health (SDOH) reviewed in EPIC. No concerns.  3. Sexuality, contraception and birth spacing - Patient does not want a pregnancy in the next year.  Desired family size is 2 children.  - She is considering sterilization. Other forms of contraception were discussed with patient and emphasized alternatives of IUDs and Nexplanon as they have equivalent contraceptive efficacy; she already has Nexplanon in place. Discussed bilateral tubal sterilization in detail; discussed option of laparoscopic bilateral salpingectomy. Risks and benefits discussed in detail including but not limited to: risk of regret,  permanence of method, bleeding, infection, injury to surrounding organs and need for additional procedures.  Failure risk of  <1% for bilateral salpingectomy with increased risk of ectopic gestation if pregnancy occurs was also discussed with patient.  Also discussed possibility of post-tubal syndrome with increased pelvic pain or menstrual irregularities.  Patient will consider these risks, and let us know if she wants to proceed. Of note, Medicaid papers were re-signed today.  Printed patient education  handouts about the procedure were given to the patient to review at home.  4. Sleep and fatigue -Encouraged family/partner/community support of 4 hrs of uninterrupted sleep to help with mood and fatigue  5. Physical Recovery  - Discussed patients delivery and complications. She describes her labor as good. - Patient had a Vaginal, no problems at delivery. Patient had  no  laceration.  - Patient has urinary incontinence? No. - Patient is safe to resume physical and sexual activity  6.  Health Maintenance - HM due items addressed Yes - Last pap smear  Diagnosis  Date Value Ref Range Status  01/08/2021 (A)  Final   - Atypical squamous cells of undetermined significance (ASC-US), negative HRHPV   Pap smear not done at today's visit.  -Breast Cancer screening indicated? No.   7. H/O A1GDM - 2 hr PP GTT done today, will follow up results and manage accordingly.   8. Hemorrhoids - hydrocortisone (ANUSOL-HC) 2.5 % rectal cream; Place rectally 2 (two) times daily.  Dispense: 30 g; Refill: 2 She will let us know of worsening symptoms.  Jaynie Collins, MD Center for [employer], [facility]

## 2021-09-26 NOTE — Patient Instructions (Signed)
Laparoscopic BilateralTubal Removal/Laparoscopic Bilateral Salpingectomy Laparoscopic tubal removal is a procedure that removes the fallopian tubes at a time other than right after childbirth. By removing the fallopian tubes, the eggs that are released from the ovaries cannot enter the uterus and sperm cannot reach the egg. This is more effective than tubal ligation which is also known as getting your "tubes tied." Tubal removal is done so you will not be able to get pregnant or have a baby.  This procedure is permanent and irreversible. If you want to have future pregnancies, you should not have this procedure.  LET YOUR CAREGIVER KNOW ABOUT: Allergies to food or medicine. Medicines taken, including vitamins, herbs, eyedrops, over-the-counter medicines, and creams. Use of steroids (by mouth or creams). Previous problems with numbing medicines. History of bleeding problems or blood clots. Any recent colds or infections. Previous surgery. Other health problems, including diabetes and kidney problems. Possibility of pregnancy, if this applies. Any past pregnancies. RISKS AND COMPLICATIONS  Infection. Bleeding. Injury to surrounding organs. Anesthetic side effects. Failure of the procedure. Ectopic pregnancy. Future regret about having the procedure done. BEFORE THE PROCEDURE Do not take aspirin or blood thinners a week before the procedure or as directed. This can cause bleeding. Do not eat or drink anything 6 to 8 hours before the procedure. PROCEDURE  You may be given a medicine to help you relax (sedative) before the procedure. You will be given a medicine to make you sleep (general anesthetic) during the procedure. A tube will be put down your throat to help your breath while under general anesthesia. Two small cuts (incisions) are made in the lower abdominal area and one incision is made near the belly button. Your abdominal area will be inflated with a safe gas (carbon dioxide). This  helps give the surgeon room to operate, visualize, and helps the surgeon avoid other organs. A thin, lighted tube (laparoscope) with a camera attached is inserted into your abdomen through the incision near the belly button. Other small instruments are also inserted through the other abdominal incisions. The fallopian tubes are located and are removed. After the fallopian tubes are removed, the gas is released from the abdomen. The incisions will be closed with stitches (sutures), and Dermabond. A bandage may be placed over the incisions. AFTER THE PROCEDURE  You will also have some mild abdominal discomfort for 3-7 days. You will be given pain medicine to ease any discomfort. As long as there are no problems, you may be allowed to go home. Someone will need to drive you home and be with you for at least 24 hours once home. You may have some mild discomfort in the throat. This is from the tube placed in your throat while you were sleeping. You may experience discomfort in the shoulder area from some trapped air between the liver and diaphragm. This sensation is normal and will slowly go away on its own. HOME CARE INSTRUCTIONS  Take all medicines as directed. Only take over-the-counter or prescription medicines for pain, discomfort, or fever as directed by your caregiver. Resume daily activities as directed. Showers are preferred over baths. You may resume sexual activities in 1 week or as directed. Do not drive while taking narcotics. SEEK MEDICAL CARE IF: . There is increasing abdominal pain. You feel lightheaded or faint. You have the chills. You have an oral temperature above 102 F (38.9 C). There is pus-like (purulent) drainage from any of the wounds. You are unable to pass gas or have a   bowel movement. You feel sick to your stomach (nauseous) or throw up (vomit). MAKE SURE YOU:  Understand these instructions. Will watch your condition. Will get help right away if you are not doing  well or get worse.  ExitCare Patient Information 2013 [employer].    

## 2021-09-27 LAB — GLUCOSE TOLERANCE, 2 HOURS W/ 1HR
Glucose, 1 hour: 151 mg/dL (ref 70–179)
Glucose, 2 hour: 124 mg/dL (ref 70–152)
Glucose, Fasting: 93 mg/dL — ABNORMAL HIGH (ref 70–91)

## 2021-10-01 ENCOUNTER — Encounter: Payer: Self-pay | Admitting: Obstetrics & Gynecology

## 2022-01-07 ENCOUNTER — Encounter: Payer: Medicaid Other | Admitting: Licensed Clinical Social Worker

## 2022-01-07 ENCOUNTER — Telehealth: Payer: Self-pay | Admitting: Licensed Clinical Social Worker

## 2022-01-07 NOTE — Telephone Encounter (Signed)
Called pt twice regarding mychart visit. Left message requesting callback

## 2022-01-17 ENCOUNTER — Ambulatory Visit (INDEPENDENT_AMBULATORY_CARE_PROVIDER_SITE_OTHER): Payer: Medicaid Other | Admitting: Licensed Clinical Social Worker

## 2022-01-17 DIAGNOSIS — F53 Postpartum depression: Secondary | ICD-10-CM | POA: Diagnosis not present

## 2022-01-19 NOTE — BH Specialist Note (Signed)
Integrated Behavioral Health via Telemedicine Visit  01/19/2022 TERRYE DOMBROSKY [id number]  Number of Integrated Behavioral Health Clinician visits: 1 Session Start time: 1:03pm  Session End time: 1:47pm Total time in minutes: 44 mins via phone per pt request   Referring Provider: n/a Patient/Family location: Home [facility] Provider location: [facility] All persons participating in visit: Pt A Larocque and LCSW A. Makayleigh Poliquin  Types of Service: Individual psychotherapy and Telephone visit  I connected with [name] and/or [facility]'s n/a via  Telephone or Engineer, civil (consulting)  (Video is Surveyor, mining) and verified that I am speaking with the correct person using two identifiers. Discussed confidentiality: Yes   I discussed the limitations of telemedicine and the availability of in person appointments.  Discussed there is a possibility of technology failure and discussed alternative modes of communication if that failure occurs.  I discussed that engaging in this telemedicine visit, they consent to the provision of behavioral healthcare and the services will be billed under their insurance.  Patient and/or legal guardian expressed understanding and consented to Telemedicine visit: Yes   Presenting Concerns: Patient and/or family reports the following symptoms/concerns: postpartum depression  Duration of problem: approx 3 months; Severity of problem: mild  Patient and/or Family's Strengths/Protective Factors: Concrete supports in place (healthy food, safe environments, etc.)  Goals Addressed: Patient will:  Reduce symptoms of: depression and stress   Increase knowledge and/or ability of: coping skills and stress reduction   Demonstrate ability to: Increase healthy adjustment to current life circumstances  Progress towards Goals: Ongoing  Interventions: Interventions utilized:  Supportive Counseling Standardized Assessments completed: Not Needed  Patient  and/or Family Response: Ms. Hunsucker reports symptoms of postpartum depression. Crying, lost of interest, social isolation and stress. Ms. Hammer is sad 3 out of 7 days and reports limited support   Assessment: Patient currently experiencing postpartum depression.   Patient may benefit from integrated behavioral health.  Plan: Follow up with behavioral health clinician on : 01/31/2021 Behavioral recommendations: Communicate needs, prioritize rest, slowly ease into previous interest and/or look into new interest and hobbies.  Referral(s): Integrated [employer] (In Clinic)  I discussed the assessment and treatment plan with the patient and/or parent/guardian. They were provided an opportunity to ask questions and all were answered. They agreed with the plan and demonstrated an understanding of the instructions.   They were advised to call back or seek an in-person evaluation if the symptoms worsen or if the condition fails to improve as anticipated.  Gwyndolyn Saxon, LCSW

## 2022-01-31 ENCOUNTER — Encounter: Payer: Medicaid Other | Admitting: Licensed Clinical Social Worker

## 2022-02-04 ENCOUNTER — Ambulatory Visit (INDEPENDENT_AMBULATORY_CARE_PROVIDER_SITE_OTHER): Payer: Medicaid Other | Admitting: Licensed Clinical Social Worker

## 2022-02-04 DIAGNOSIS — F53 Postpartum depression: Secondary | ICD-10-CM

## 2022-02-04 NOTE — BH Specialist Note (Signed)
Integrated Behavioral Health via Telemedicine Visit  02/04/2022 KENSEY LUEPKE [id number]  Number of [place] Clinician visits: 2 Session Start time:  1:00pm Session End time: 1:40pm Total time in minutes: 40 mins via phone due to pt returning call   Referring Provider: n/a Patient/Family location: Home  [facility] Provider location: [place]  All persons participating in visit: Pt A Slutsky and LCSW A Donye Dauenhauer  Types of Service: Individual psychotherapy and Telephone visit  I connected with [name] and/or [employer] [place]'s n/a via  Telephone or Geologist, engineering  (Video is Tree surgeon) and verified that I am speaking with the correct person using two identifiers. Discussed confidentiality: Yes   I discussed the limitations of telemedicine and the availability of in person appointments.  Discussed there is a possibility of technology failure and discussed alternative modes of communication if that failure occurs.  I discussed that engaging in this telemedicine visit, they consent to the provision of behavioral healthcare and the services will be billed under their insurance.  Patient and/or legal guardian expressed understanding and consented to Telemedicine visit: Yes   Presenting Concerns: Patient and/or family reports the following symptoms/concerns: postpartum depression Duration of problem: approx 3 months ; Severity of problem: mild  Patient and/or Family's Strengths/Protective Factors: Concrete supports in place (healthy food, safe environments, etc.)  Goals Addressed: Patient will:  Reduce symptoms of: depression and stress   Increase knowledge and/or ability of: coping skills and stress reduction   Demonstrate ability to: Increase adequate support systems for patient/family  Progress towards Goals: Ongoing  Interventions: Interventions utilized:  Solution-Focused Strategies and Supportive Counseling Standardized Assessments  completed: Not Needed  Patient and/or Family Response: Ms. Mccarrell reports improvement mood with physical activity however parenting differences with partner have cause additional stress with Ms. Morneault. Ms. Tiley reports crying and depressed mood. Ms. Crothers recently engage in physical activity and various self care techniques to boost mood   Assessment: Patient currently experiencing postpartum depression.   Patient may benefit from integrated behavioral health.  Plan: Follow up with behavioral health clinician on : 3 weeks  Behavioral recommendations: Continue with physical activity and self care, create chore chart and delegate task promote collaboration in the home and prevent burnout  Referral(s): [place] (In Clinic)  I discussed the assessment and treatment plan with the patient and/or parent/guardian. They were provided an opportunity to ask questions and all were answered. They agreed with the plan and demonstrated an understanding of the instructions.   They were advised to call back or seek an in-person evaluation if the symptoms worsen or if the condition fails to improve as anticipated.  Lynnea Ferrier, LCSW

## 2022-02-12 ENCOUNTER — Other Ambulatory Visit: Payer: Self-pay | Admitting: Obstetrics and Gynecology

## 2022-02-12 DIAGNOSIS — Z3480 Encounter for supervision of other normal pregnancy, unspecified trimester: Secondary | ICD-10-CM

## 2022-02-25 ENCOUNTER — Encounter: Payer: Medicaid Other | Admitting: Licensed Clinical Social Worker

## 2022-03-22 DIAGNOSIS — O24419 Gestational diabetes mellitus in pregnancy, unspecified control: Secondary | ICD-10-CM

## 2022-03-22 HISTORY — DX: Gestational diabetes mellitus in pregnancy, unspecified control: O24.419

## 2022-04-23 ENCOUNTER — Other Ambulatory Visit (HOSPITAL_COMMUNITY): Payer: Self-pay

## 2022-04-23 ENCOUNTER — Ambulatory Visit
Admission: EM | Admit: 2022-04-23 | Discharge: 2022-04-23 | Disposition: A | Discharge status: Home / Self Care | Payer: Medicaid Other | Attending: Internal Medicine | Admitting: Internal Medicine

## 2022-04-23 ENCOUNTER — Emergency Department (HOSPITAL_COMMUNITY)
Admission: EM | Admit: 2022-04-23 | Discharge: 2022-04-23 | Disposition: A | Discharge status: Home / Self Care | Payer: Medicaid Other | Attending: Emergency Medicine | Admitting: Emergency Medicine

## 2022-04-23 ENCOUNTER — Other Ambulatory Visit: Payer: Self-pay

## 2022-04-23 DIAGNOSIS — R112 Nausea with vomiting, unspecified: Secondary | ICD-10-CM

## 2022-04-23 DIAGNOSIS — E1165 Type 2 diabetes mellitus with hyperglycemia: Secondary | ICD-10-CM | POA: Diagnosis not present

## 2022-04-23 DIAGNOSIS — R739 Hyperglycemia, unspecified: Secondary | ICD-10-CM

## 2022-04-23 DIAGNOSIS — Z7984 Long term (current) use of oral hypoglycemic drugs: Secondary | ICD-10-CM | POA: Diagnosis not present

## 2022-04-23 DIAGNOSIS — B3731 Acute candidiasis of vulva and vagina: Secondary | ICD-10-CM | POA: Insufficient documentation

## 2022-04-23 DIAGNOSIS — R197 Diarrhea, unspecified: Secondary | ICD-10-CM

## 2022-04-23 DIAGNOSIS — E119 Type 2 diabetes mellitus without complications: Secondary | ICD-10-CM

## 2022-04-23 LAB — POCT URINE PREGNANCY: Preg Test, Ur: NEGATIVE

## 2022-04-23 LAB — COMPREHENSIVE METABOLIC PANEL
ALT: 24 U/L (ref 0–44)
AST: 28 U/L (ref 15–41)
Albumin: 4.4 g/dL (ref 3.5–5.0)
Alkaline Phosphatase: 75 U/L (ref 38–126)
Anion gap: 17 — ABNORMAL HIGH (ref 5–15)
BUN: 8 mg/dL (ref 6–20)
CO2: 14 mmol/L — ABNORMAL LOW (ref 22–32)
Calcium: 9.2 mg/dL (ref 8.9–10.3)
Chloride: 102 mmol/L (ref 98–111)
Creatinine, Ser: 0.9 mg/dL (ref 0.44–1.00)
GFR, Estimated: >60 mL/min (ref >60)
Glucose, Bld: 329 mg/dL — ABNORMAL HIGH (ref 70–99)
Potassium: 4.1 mmol/L (ref 3.5–5.1)
Sodium: 133 mmol/L — ABNORMAL LOW (ref 135–145)
Total Bilirubin: 1.3 mg/dL — ABNORMAL HIGH (ref 0.3–1.2)
Total Protein: 8.5 g/dL — ABNORMAL HIGH (ref 6.5–8.1)

## 2022-04-23 LAB — BLOOD GAS, VENOUS
Acid-base deficit: 9.2 mmol/L — ABNORMAL HIGH (ref 0.0–2.0)
Acid-base deficit: 8.3 mmol/L — ABNORMAL HIGH (ref 0.0–2.0)
Bicarbonate: 16.2 mmol/L — ABNORMAL LOW (ref 20.0–28.0)
Bicarbonate: 16.2 mmol/L — ABNORMAL LOW (ref 20.0–28.0)
O2 Saturation: 49.1 %
O2 Saturation: 59.3 %
Patient temperature: 37
Patient temperature: 37
pCO2, Ven: 33 mmHg — ABNORMAL LOW (ref 44–60)
pCO2, Ven: 30 mmHg — ABNORMAL LOW (ref 44–60)
pH, Ven: 7.3 (ref 7.25–7.43)
pH, Ven: 7.34 (ref 7.25–7.43)
pO2, Ven: <31 mmHg — CL (ref 32–45)
pO2, Ven: 32 mmHg (ref 32–45)

## 2022-04-23 LAB — I-STAT BETA HCG BLOOD, ED (MC, WL, AP ONLY): I-stat hCG, quantitative: <5 m[IU]/mL (ref <5)

## 2022-04-23 LAB — POCT FASTING CBG KUC MANUAL ENTRY: POCT Glucose (KUC): 358 mg/dL — AB (ref 70–99)

## 2022-04-23 LAB — CBC
HCT: 43.4 % (ref 36.0–46.0)
Hemoglobin: 14.4 g/dL (ref 12.0–15.0)
MCH: 28.1 pg (ref 26.0–34.0)
MCHC: 33.2 g/dL (ref 30.0–36.0)
MCV: 84.6 fL (ref 80.0–100.0)
Platelets: 273 10*3/uL (ref 150–400)
RBC: 5.13 MIL/uL — ABNORMAL HIGH (ref 3.87–5.11)
RDW: 14.9 % (ref 11.5–15.5)
WBC: 7.4 10*3/uL (ref 4.0–10.5)
nRBC: 0 % (ref 0.0–0.2)

## 2022-04-23 LAB — POCT URINALYSIS DIP (MANUAL ENTRY)
Glucose, UA: 500 mg/dL — AB
Leukocytes, UA: NEGATIVE
Nitrite, UA: NEGATIVE
Protein Ur, POC: 100 mg/dL — AB
Spec Grav, UA: >=1.03 — AB (ref 1.010–1.025)
Urobilinogen, UA: 0.2 E.U./dL
pH, UA: 5.5 (ref 5.0–8.0)

## 2022-04-23 LAB — CBG MONITORING, ED
Glucose-Capillary: 338 mg/dL — ABNORMAL HIGH (ref 70–99)
Glucose-Capillary: 283 mg/dL — ABNORMAL HIGH (ref 70–99)
Glucose-Capillary: 269 mg/dL — ABNORMAL HIGH (ref 70–99)

## 2022-04-23 LAB — LIPASE, BLOOD: Lipase: 23 U/L (ref 11–51)

## 2022-04-23 MED ORDER — INSULIN ASPART 100 UNIT/ML IJ SOLN
4.0000 [IU] | Freq: Once | INTRAMUSCULAR | Status: AC
  Administered 2022-04-23: 4 [IU] via SUBCUTANEOUS
  Filled 2022-04-23: qty 0.04

## 2022-04-23 MED ORDER — METFORMIN HCL 500 MG PO TABS
500.0000 mg | ORAL_TABLET | Freq: Two times a day (BID) | ORAL | 0 refills | Status: DC
  Filled 2022-04-23: qty 60, 30d supply, fill #0

## 2022-04-23 MED ORDER — LACTATED RINGERS IV BOLUS
500.0000 mL | Freq: Once | INTRAVENOUS | Status: AC
  Administered 2022-04-23: 500 mL via INTRAVENOUS

## 2022-04-23 MED ORDER — FLUCONAZOLE 150 MG PO TABS
150.0000 mg | ORAL_TABLET | Freq: Once | ORAL | Status: AC
  Administered 2022-04-23: 150 mg via ORAL
  Filled 2022-04-23: qty 1

## 2022-04-23 MED ORDER — LACTATED RINGERS IV BOLUS
1000.0000 mL | Freq: Once | INTRAVENOUS | Status: AC
  Administered 2022-04-23: 1000 mL via INTRAVENOUS

## 2022-04-23 NOTE — ED Provider Notes (Signed)
[facility]    CSN: [id number] Arrival date & time: 04/23/22  [id number]      History   Chief Complaint Chief Complaint  Patient presents with   Abdominal Pain    HPI TRINNA ENZOR is a 32 y.o. female.   Patient presents with nausea, vomiting, diarrhea, abdominal pain that started about 3 days ago.  Patient reports diarrhea has now resolved and she is left with nausea.  Patient denies blood in stool or emesis.  Reports some generalized abdominal cramping as well.  Reports that her family members have had similar symptoms in the past week.  She reports that she is having difficulty keeping food and fluids down.  Denies cough, nasal congestion, fever.  Also reporting symptoms of a possible vaginal yeast infection as she has been having vaginal discharge over the past 2 weeks.  Denies any exposure to STD or any concern for this.  Patient reports last menstrual cycle was prior to being pregnant with her child.  She delivered her child in July 2023.  Reports that she had gestational diabetes which resolved upon delivery.  Reports that her blood sugars were normal postpartum.  Denies urinary frequency, dysuria, hematuria, back pain.  Patient does state that she has been having intermittent yeast infections ever since delivering her child.   Abdominal Pain   Past Medical History:  Diagnosis Date   Asthma    GDM, class A1 08/09/2021   Normal PP 2 hr GTT in 2023   Headache     Patient Active Problem List   Diagnosis Date Noted   ASCUS of cervix with negative high risk HPV 01/10/2021   BMI 36.0-36.9,adult 10/06/2015    Past Surgical History:  Procedure Laterality Date   NO PAST SURGERIES      OB History     Gravida  5   Para  2   Term  2   Preterm  0   AB  3   Living  2      SAB  1   IAB  2   Ectopic  0   Multiple  0   Live Births  2            Home Medications    Prior to Admission medications   Medication Sig Start Date End Date Taking?  Authorizing Provider  Blood Glucose Monitoring Suppl (ACCU-CHEK GUIDE) w/Device KIT Use glucose meter to monitor blood sugar 4 times daily Patient not taking: Reported on 07/18/2021 05/21/21   Constant, Peggy, MD  Blood Pressure Monitoring (BLOOD PRESSURE KIT) DEVI 1 Device by Does not apply route once a week. Patient not taking: Reported on 07/18/2021 12/28/20   Chancy Milroy, MD  glucose blood (ACCU-CHEK GUIDE) test strip Use 1 strip to check blood sugar 4 times daily Patient not taking: Reported on 07/18/2021 05/21/21   Constant, Peggy, MD  hydrocortisone (ANUSOL-HC) 2.5 % rectal cream Place rectally 2 (two) times daily. 09/26/21   Anyanwu, Sallyanne Havers, MD  hydrOXYzine (ATARAX) 10 MG tablet Take 1 tablet (10 mg total) by mouth 3 (three) times daily as needed. 08/12/21   Concepcion Living, MD  Prenatal Vit-Fe Fumarate-FA (M-NATAL PLUS) 27-1 MG TABS TAKE 1 TABLET BY MOUTH EVERY DAY 02/12/22   Shelly Bombard, MD    Family History Family History  Problem Relation Age of Onset   Diabetes Mother    Diabetes Father    Kidney disease Father    Hypertension Father  Liver disease Father    Cancer Maternal Grandfather    Diabetes Maternal Grandfather    Hypertension Paternal Grandmother     Social History Social History   Tobacco Use   Smoking status: Never   Smokeless tobacco: Never  Vaping Use   Vaping Use: Never used  Substance Use Topics   Alcohol use: No   Drug use: No     Allergies   Patient has no known allergies.   Review of Systems Review of Systems Per HPI  Physical Exam Triage Vital Signs ED Triage Vitals  Enc Vitals Group     BP 04/23/22 0931 133/86     Pulse Rate 04/23/22 0931 (!) 117     Resp 04/23/22 0931 18     Temp 04/23/22 0931 97.7 F (36.5 C)     Temp Source 04/23/22 0931 Oral     SpO2 04/23/22 0931 97 %     Weight --      Height --      Head Circumference --      Peak Flow --      Pain Score 04/23/22 0930 4     Pain Loc --      Pain Edu? --       Excl. in [place]? --    No data found.  Updated Vital Signs BP 133/86 (BP Location: Right Arm)   Pulse (!) 117   Temp 97.7 F (36.5 C) (Oral)   Resp 18   SpO2 97%   Visual Acuity Right Eye Distance:   Left Eye Distance:   Bilateral Distance:    Right Eye Near:   Left Eye Near:    Bilateral Near:     Physical Exam Constitutional:      General: She is not in acute distress.    Appearance: Normal appearance. She is not toxic-appearing or diaphoretic.  HENT:     Head: Normocephalic and atraumatic.     Mouth/Throat:     Mouth: Mucous membranes are moist.     Pharynx: No posterior oropharyngeal erythema.  Eyes:     Extraocular Movements: Extraocular movements intact.     Conjunctiva/sclera: Conjunctivae normal.  Cardiovascular:     Rate and Rhythm: Regular rhythm. Tachycardia present.     Pulses: Normal pulses.     Heart sounds: Normal heart sounds.  Pulmonary:     Effort: Pulmonary effort is normal. No respiratory distress.     Breath sounds: Normal breath sounds.  Abdominal:     General: Bowel sounds are normal. There is no distension.     Palpations: Abdomen is soft.     Tenderness: There is no abdominal tenderness.  Neurological:     General: No focal deficit present.     Mental Status: She is alert and oriented to person, place, and time. Mental status is at baseline.  Psychiatric:        Mood and Affect: Mood normal.        Behavior: Behavior normal.        Thought Content: Thought content normal.        Judgment: Judgment normal.      UC Treatments / Results  Labs (all labs ordered are listed, but only abnormal results are displayed) Labs Reviewed  POCT URINALYSIS DIP (MANUAL ENTRY) - Abnormal; Notable for the following components:      Result Value   Glucose, UA =500 (*)    Bilirubin, UA small (*)    Ketones, POC UA >= (160) (*)  Spec Grav, UA >=1.030 (*)    Blood, UA trace-lysed (*)    Protein Ur, POC =100 (*)    All other components within normal  limits  POCT FASTING CBG [facility] MANUAL ENTRY - Abnormal; Notable for the following components:   POCT Glucose ([facility]) 358 (*)    All other components within normal limits  POCT URINE PREGNANCY    EKG   Radiology No results found.  Procedures Procedures (including critical care time)  Medications Ordered in UC Medications - No data to display  Initial Impression / Assessment and Plan / UC Course  I have reviewed the triage vital signs and the nursing notes.  Pertinent labs & imaging results that were available during my care of the patient were reviewed by me and considered in my medical decision making (see chart for details).     UA was completed that showed a large amount of ketones and glucose in urine.  CBC was completed today that was 358.  I am very concerned that patient could be having dehydration versus complications related to hyperglycemia such as DKA especially with associated nausea, vomiting, diarrhea, abdominal pain.  Patient most likely has a vaginal yeast infection as well which is due to uncontrolled hyperglycemia.  Patient was advised to go to the emergency department for further evaluation and management given limited resources here in urgent care.  Patient was agreeable with this plan.  Will defer all evaluation including for yeast infection to the ER given urgency of situation.  Patient wishes self transport.  Patient left via self transport to go to the emergency department. Final Clinical Impressions(s) / UC Diagnoses   Final diagnoses:  Hyperglycemia  Nausea vomiting and diarrhea     Discharge Instructions      Please go to straight to the emergency department as I am concerned for dehydration versus complications related to hyperglycemia.    ED Prescriptions   None    PDMP not reviewed this encounter.   Teodora Medici, [place] 04/23/22 1125

## 2022-04-23 NOTE — Discharge Instructions (Signed)
Please go to straight to the emergency department as I am concerned for dehydration versus complications related to hyperglycemia.

## 2022-04-23 NOTE — ED Provider Notes (Signed)
[place] EMERGENCY DEPARTMENT AT [facility] Provider Note   CSN: [id number] Arrival date & time: 04/23/22  1047     History  Chief Complaint  Patient presents with   Hyperglycemia    Lisa Brown is a 32 y.o. female.  HPI 32 year old female history of multiple gestations with 2 live births with her youngest child several months presents today complaining of hyperglycemia.  She states that she had some nausea and vomiting and diarrhea last week that she feels she caught from her children.  States she is [place] her family to have the symptoms.  This has since resolved.  She had come continued whitish vaginal discharge without any pain, fever, she feels this is secondary to a yeast infection.  Daughter states she took medication for it and return.  Secondary to this she went to the urgent care today.  She was told to come to the ED to evaluate for DKA.  She reports that she had hyperglycemia on p.o. challenge with her last child however she was able to control her blood sugars with diet.  She has a family history of diabetes and states she is tested fairly frequently for diabetes.  She denies fever, chest pain, dyspnea, history of blood clots in her legs or lungs.     Home Medications Prior to Admission medications   Medication Sig Start Date End Date Taking? Authorizing Provider  metFORMIN (GLUCOPHAGE) 500 MG tablet Take 1 tablet (500 mg total) by mouth 2 (two) times daily with a meal. 04/23/22  Yes Pattricia Boss, MD  Blood Glucose Monitoring Suppl (ACCU-CHEK GUIDE) w/Device KIT Use glucose meter to monitor blood sugar 4 times daily Patient not taking: Reported on 07/18/2021 05/21/21   Constant, Peggy, MD  Blood Pressure Monitoring (BLOOD PRESSURE KIT) DEVI 1 Device by Does not apply route once a week. Patient not taking: Reported on 07/18/2021 12/28/20   Chancy Milroy, MD  glucose blood (ACCU-CHEK GUIDE) test strip Use 1 strip to check blood sugar 4 times daily Patient not  taking: Reported on 07/18/2021 05/21/21   Constant, Peggy, MD  hydrocortisone (ANUSOL-HC) 2.5 % rectal cream Place rectally 2 (two) times daily. 09/26/21   Anyanwu, Sallyanne Havers, MD  hydrOXYzine (ATARAX) 10 MG tablet Take 1 tablet (10 mg total) by mouth 3 (three) times daily as needed. 08/12/21   Concepcion Living, MD  Prenatal Vit-Fe Fumarate-FA (M-NATAL PLUS) 27-1 MG TABS TAKE 1 TABLET BY MOUTH EVERY DAY 02/12/22   Shelly Bombard, MD      Allergies    Patient has no known allergies.    Review of Systems   Review of Systems  Physical Exam Updated Vital Signs BP 123/83   Pulse 87   Temp 98.2 F (36.8 C) (Oral)   Resp 17   Ht 1.803 m (5\' 11" )   Wt 108 kg   SpO2 100%   BMI 33.19 kg/m  Physical Exam Vitals reviewed.  HENT:     Head: Normocephalic.     Right Ear: External ear normal.     Left Ear: External ear normal.     Nose: Nose normal.     Mouth/Throat:     Pharynx: Oropharynx is clear.  Eyes:     Extraocular Movements: Extraocular movements intact.     Pupils: Pupils are equal, round, and reactive to light.  Cardiovascular:     Rate and Rhythm: Normal rate and regular rhythm.     Pulses: Normal pulses.  Heart sounds: Normal heart sounds.  Pulmonary:     Effort: Pulmonary effort is normal.     Breath sounds: Normal breath sounds.  Abdominal:     General: Abdomen is flat. Bowel sounds are normal.     Palpations: Abdomen is soft.  Musculoskeletal:        General: Normal range of motion.     Cervical back: Normal range of motion.  Skin:    General: Skin is warm and dry.     Capillary Refill: Capillary refill takes less than 2 seconds.  Neurological:     General: No focal deficit present.     Mental Status: She is alert.  Psychiatric:        Mood and Affect: Mood normal.     ED Results / Procedures / Treatments   Labs (all labs ordered are listed, but only abnormal results are displayed) Labs Reviewed  CBC - Abnormal; Notable for the following components:       Result Value   RBC 5.13 (*)    All other components within normal limits  COMPREHENSIVE METABOLIC PANEL - Abnormal; Notable for the following components:   Sodium 133 (*)    CO2 14 (*)    Glucose, Bld 329 (*)    Total Protein 8.5 (*)    Total Bilirubin 1.3 (*)    Anion gap 17 (*)    All other components within normal limits  BLOOD GAS, VENOUS - Abnormal; Notable for the following components:   pCO2, Ven 33 (*)    pO2, Ven <31 (*)    Bicarbonate 16.2 (*)    Acid-base deficit 9.2 (*)    All other components within normal limits  BLOOD GAS, VENOUS - Abnormal; Notable for the following components:   pCO2, Ven 30 (*)    Bicarbonate 16.2 (*)    Acid-base deficit 8.3 (*)    All other components within normal limits  CBG MONITORING, ED - Abnormal; Notable for the following components:   Glucose-Capillary 338 (*)    All other components within normal limits  CBG MONITORING, ED - Abnormal; Notable for the following components:   Glucose-Capillary 283 (*)    All other components within normal limits  LIPASE, BLOOD  I-STAT CHEM 8, ED  I-STAT BETA HCG BLOOD, ED ([facility], [facility], [facility] ONLY)  CBG MONITORING, ED    EKG None  Radiology No results found.  Procedures Procedures    Medications Ordered in ED Medications  fluconazole (DIFLUCAN) tablet 150 mg (has no administration in time range)  lactated ringers bolus 1,000 mL (0 mLs Intravenous Stopped 04/23/22 1254)  lactated ringers bolus 500 mL (0 mLs Intravenous Stopped 04/23/22 1505)  insulin aspart (novoLOG) injection 4 Units (4 Units Subcutaneous Given 04/23/22 1434)  lactated ringers bolus 500 mL (500 mLs Intravenous New Bag/Given 04/23/22 1517)    ED Course/ Medical Decision Making/ A&P Clinical Course as of 04/23/22 1533  Tue Apr 23, 2022  1148 Venous pH 7.3 on my review and interpretation of her venous blood gas Complete metabolic panel reviewed and interpreted and significant for CO2 decreased at 14 and elevated anion gap of 17 [DR]   1148 CBC reviewed interpreted significant for normal white blood cell count, hemoglobin, and platelets [DR]  1148 Patient's blood sugar is elevated at 329 [DR]    Clinical Course User Index [DR] Pattricia Boss, MD  Medical Decision Making Amount and/or Complexity of Data Reviewed Labs: ordered.  Risk Prescription drug management.   1- hyperglycemia- vbg with ph 7.3, bicarb 16, insulin given Patient appears clinically very well with no complaint except discharge Discussed with DR. Harris on for [place] and she will be rechecked tomorrow Will start metformin  2- clinical yeast infection-will rx diflucan       Final Clinical Impression(s) / ED Diagnoses Final diagnoses:  Hyperglycemia  Diabetes mellitus, new onset  Vaginal yeast infection    Rx / DC Orders ED Discharge Orders          Ordered    metFORMIN (GLUCOPHAGE) 500 MG tablet  2 times daily with meals        04/23/22 1532              Pattricia Boss, MD 04/23/22 1533

## 2022-04-23 NOTE — ED Triage Notes (Signed)
Pt presents with c/o abdominal pain, N/V/D on Sunday. States she felt nauseas this morning and states she has a yeast infection.

## 2022-04-23 NOTE — ED Triage Notes (Signed)
Pt sent from UC due to hyperglycemia. CBG 338    C/o abd pain with N/V

## 2022-04-23 NOTE — ED Notes (Signed)
Patient is being discharged from the Urgent Care and sent to the Emergency Department via POV . Per [facility], patient is in need of higher level of care due to abdominal pain, nausea, vomiting, hyperglycemia. Patient is aware and verbalizes understanding of plan of care.  Vitals:   04/23/22 0931  BP: 133/86  Pulse: (!) 117  Resp: 18  Temp: 97.7 F (36.5 C)  SpO2: 97%

## 2022-04-23 NOTE — Discharge Instructions (Signed)
Your blood sugar is elevated here in the emergency department Pick up your metformin and begin taking it tonight Please recheck with your doctor at [facility] tomorrow Return to the emergency department if you are having worsening symptoms at any time You were given a dose of Diflucan for the vaginal yeast infection here in the emergency department.

## 2022-04-24 ENCOUNTER — Other Ambulatory Visit (HOSPITAL_COMMUNITY): Payer: Self-pay

## 2022-04-24 MED ORDER — METFORMIN HCL 500 MG PO TABS
500.0000 mg | ORAL_TABLET | Freq: Two times a day (BID) | ORAL | 0 refills | Status: DC
  Filled 2022-04-24: qty 60, 30d supply, fill #0

## 2022-04-25 ENCOUNTER — Other Ambulatory Visit (HOSPITAL_COMMUNITY): Payer: Self-pay

## 2022-04-26 ENCOUNTER — Other Ambulatory Visit (HOSPITAL_COMMUNITY): Payer: Self-pay

## 2022-04-26 MED ORDER — FREESTYLE LIBRE 3 SENSOR MISC
1.0000 | 3 refills | Status: DC
  Filled 2022-04-26: qty 6, 84d supply, fill #0

## 2022-04-27 ENCOUNTER — Other Ambulatory Visit (HOSPITAL_COMMUNITY): Payer: Self-pay

## 2022-04-29 ENCOUNTER — Other Ambulatory Visit (HOSPITAL_COMMUNITY): Payer: Self-pay

## 2022-04-29 MED ORDER — JARDIANCE 10 MG PO TABS
10.0000 mg | ORAL_TABLET | Freq: Every day | ORAL | 3 refills | Status: DC
  Filled 2022-04-29: qty 30, 30d supply, fill #0

## 2022-06-07 ENCOUNTER — Other Ambulatory Visit (HOSPITAL_COMMUNITY): Payer: Self-pay

## 2022-06-12 ENCOUNTER — Other Ambulatory Visit (HOSPITAL_COMMUNITY): Payer: Self-pay

## 2022-06-13 ENCOUNTER — Other Ambulatory Visit (HOSPITAL_COMMUNITY): Payer: Self-pay

## 2023-01-06 ENCOUNTER — Other Ambulatory Visit: Payer: Self-pay

## 2023-01-06 ENCOUNTER — Emergency Department (HOSPITAL_COMMUNITY)
Admission: EM | Admit: 2023-01-06 | Discharge: 2023-01-06 | Disposition: A | Discharge status: Home / Self Care | Payer: Medicaid Other | Attending: Emergency Medicine | Admitting: Emergency Medicine

## 2023-01-06 ENCOUNTER — Encounter (HOSPITAL_COMMUNITY): Payer: Self-pay

## 2023-01-06 DIAGNOSIS — M791 Myalgia, unspecified site: Secondary | ICD-10-CM | POA: Diagnosis present

## 2023-01-06 LAB — CBC WITH DIFFERENTIAL/PLATELET
Abs Immature Granulocytes: 0.02 10*3/uL (ref 0.00–0.07)
Basophils Absolute: 0 10*3/uL (ref 0.0–0.1)
Basophils Relative: 1 %
Eosinophils Absolute: 0.1 10*3/uL (ref 0.0–0.5)
Eosinophils Relative: 1 %
HCT: 37.4 % (ref 36.0–46.0)
Hemoglobin: 13 g/dL (ref 12.0–15.0)
Immature Granulocytes: 0 %
Lymphocytes Relative: 29 %
Lymphs Abs: 1.7 10*3/uL (ref 0.7–4.0)
MCH: 30.5 pg (ref 26.0–34.0)
MCHC: 34.8 g/dL (ref 30.0–36.0)
MCV: 87.8 fL (ref 80.0–100.0)
Monocytes Absolute: 0.5 10*3/uL (ref 0.1–1.0)
Monocytes Relative: 8 %
Neutro Abs: 3.6 10*3/uL (ref 1.7–7.7)
Neutrophils Relative %: 61 %
Platelets: 299 10*3/uL (ref 150–400)
RBC: 4.26 MIL/uL (ref 3.87–5.11)
RDW: 14.1 % (ref 11.5–15.5)
WBC: 5.9 10*3/uL (ref 4.0–10.5)
nRBC: 0 % (ref 0.0–0.2)

## 2023-01-06 LAB — I-STAT CHEM 8, ED
BUN: 15 mg/dL (ref 6–20)
Calcium, Ion: 1.16 mmol/L (ref 1.15–1.40)
Chloride: 103 mmol/L (ref 98–111)
Creatinine, Ser: 0.7 mg/dL (ref 0.44–1.00)
Glucose, Bld: 204 mg/dL — ABNORMAL HIGH (ref 70–99)
HCT: 42 % (ref 36.0–46.0)
Hemoglobin: 14.3 g/dL (ref 12.0–15.0)
Potassium: 4.3 mmol/L (ref 3.5–5.1)
Sodium: 140 mmol/L (ref 135–145)
TCO2: 26 mmol/L (ref 22–32)

## 2023-01-06 LAB — COMPREHENSIVE METABOLIC PANEL
ALT: 9 U/L (ref 0–44)
AST: 8 U/L — ABNORMAL LOW (ref 15–41)
Albumin: 1.7 g/dL — ABNORMAL LOW (ref 3.5–5.0)
Alkaline Phosphatase: 21 U/L — ABNORMAL LOW (ref 38–126)
Anion gap: 4 — ABNORMAL LOW (ref 5–15)
BUN: 9 mg/dL (ref 6–20)
CO2: 12 mmol/L — ABNORMAL LOW (ref 22–32)
Calcium: 4 mg/dL — CL (ref 8.9–10.3)
Chloride: 128 mmol/L — ABNORMAL HIGH (ref 98–111)
Creatinine, Ser: <0.3 mg/dL — ABNORMAL LOW (ref 0.44–1.00)
Glucose, Bld: 123 mg/dL — ABNORMAL HIGH (ref 70–99)
Potassium: <2 mmol/L — CL (ref 3.5–5.1)
Sodium: 144 mmol/L (ref 135–145)
Total Bilirubin: 0.4 mg/dL (ref <1.2)
Total Protein: 3.2 g/dL — ABNORMAL LOW (ref 6.5–8.1)

## 2023-01-06 LAB — CK: Total CK: 103 U/L (ref 38–234)

## 2023-01-06 MED ORDER — DIAZEPAM 5 MG PO TABS
5.0000 mg | ORAL_TABLET | Freq: Once | ORAL | Status: AC
  Administered 2023-01-06: 5 mg via ORAL
  Filled 2023-01-06: qty 1

## 2023-01-06 MED ORDER — IBUPROFEN 600 MG PO TABS: 600.0000 mg | ORAL_TABLET | Freq: Four times a day (QID) | ORAL | 0 refills | Status: AC | PRN

## 2023-01-06 MED ORDER — KETOROLAC TROMETHAMINE 30 MG/ML IJ SOLN
15.0000 mg | Freq: Once | INTRAMUSCULAR | Status: AC
  Administered 2023-01-06: 15 mg via INTRAVENOUS
  Filled 2023-01-06: qty 1

## 2023-01-06 MED ORDER — METHOCARBAMOL 500 MG PO TABS: 500.0000 mg | ORAL_TABLET | Freq: Two times a day (BID) | ORAL | 0 refills | Status: DC

## 2023-01-06 NOTE — ED Triage Notes (Signed)
Pt arrives via EMS from home. Pt reports waking up this morning with pain and cramping in bilateral thighs that radiates down to her calf. Pt denies any injury. AxOx4.

## 2023-01-06 NOTE — ED Notes (Signed)
Pt discharged home. Discharge information discussed. No s/s of distress observed during discharge. 

## 2023-01-06 NOTE — ED Provider Notes (Signed)
[place] EMERGENCY DEPARTMENT AT [facility] Provider Note   CSN: [id number] Arrival date & time: 01/06/23  0865     History  Chief Complaint  Patient presents with   Leg Pain    Lisa Brown is a 32 y.o. female.  32 year old female presents with bilateral thigh pain.  States she woke this morning with the symptoms.  Also notes some discomfort in her bilateral calfs.  Denies any weakness or dyspnea.  No recent history of trauma.  No new medication use.  Does have a history of prediabetes but is not taking medications currently.  No prior history of same.  No recent travel history.  No treatment use prior to arrival       Home Medications Prior to Admission medications   Medication Sig Start Date End Date Taking? Authorizing Provider  Blood Glucose Monitoring Suppl (ACCU-CHEK GUIDE) w/Device KIT Use glucose meter to monitor blood sugar 4 times daily Patient not taking: Reported on 07/18/2021 05/21/21   Constant, Peggy, MD  Blood Pressure Monitoring (BLOOD PRESSURE KIT) DEVI 1 Device by Does not apply route once a week. Patient not taking: Reported on 07/18/2021 12/28/20   Hermina Staggers, MD  Continuous Glucose Sensor (FREESTYLE LIBRE 3 SENSOR) MISC Change sensor every 14 days 04/26/22   Laurann Montana, MD  glucose blood (ACCU-CHEK GUIDE) test strip Use 1 strip to check blood sugar 4 times daily Patient not taking: Reported on 07/18/2021 05/21/21   Constant, Peggy, MD  hydrocortisone (ANUSOL-HC) 2.5 % rectal cream Place rectally 2 (two) times daily. 09/26/21   Anyanwu, Jethro Bastos, MD  hydrOXYzine (ATARAX) 10 MG tablet Take 1 tablet (10 mg total) by mouth 3 (three) times daily as needed. 08/12/21   Celedonio Savage, MD  metFORMIN (GLUCOPHAGE) 500 MG tablet Take 1 tablet (500 mg total) by mouth 2 (two) times daily with a meal. 04/23/22   Margarita Grizzle, MD  metFORMIN (GLUCOPHAGE) 500 MG tablet Take 1 tablet (500 mg total) by mouth 2 (two) times daily with a meal. 04/23/22   Margarita Grizzle,  MD  Prenatal Vit-Fe Fumarate-FA (M-NATAL PLUS) 27-1 MG TABS TAKE 1 TABLET BY MOUTH EVERY DAY 02/12/22   Brock Bad, MD      Allergies    Patient has no known allergies.    Review of Systems   Review of Systems  All other systems reviewed and are negative.   Physical Exam Updated Vital Signs BP 127/86 (BP Location: Right Arm)   Pulse 86   Temp 98.2 F (36.8 C) (Oral)   Resp 18   Ht 1.803 m (5\' 11" )   Wt 93 kg   SpO2 100%   BMI 28.59 kg/m  Physical Exam Vitals and nursing note reviewed.  Constitutional:      General: She is not in acute distress.    Appearance: Normal appearance. She is well-developed. She is not toxic-appearing.  HENT:     Head: Normocephalic and atraumatic.  Eyes:     General: Lids are normal.     Conjunctiva/sclera: Conjunctivae normal.     Pupils: Pupils are equal, round, and reactive to light.  Neck:     Thyroid: No thyroid mass.     Trachea: No tracheal deviation.  Cardiovascular:     Rate and Rhythm: Normal rate and regular rhythm.     Heart sounds: Normal heart sounds. No murmur heard.    No gallop.  Pulmonary:     Effort: Pulmonary effort is normal. No  respiratory distress.     Breath sounds: Normal breath sounds. No stridor. No decreased breath sounds, wheezing, rhonchi or rales.  Abdominal:     General: There is no distension.     Palpations: Abdomen is soft.     Tenderness: There is no abdominal tenderness. There is no rebound.  Musculoskeletal:        General: No tenderness. Normal range of motion.     Cervical back: Normal range of motion and neck supple.       Legs:  Skin:    General: Skin is warm and dry.     Findings: No abrasion or rash.  Neurological:     Mental Status: She is alert and oriented to person, place, and time. Mental status is at baseline.     GCS: GCS eye subscore is 4. GCS verbal subscore is 5. GCS motor subscore is 6.     Cranial Nerves: No cranial nerve deficit.     Sensory: No sensory deficit.      Motor: Motor function is intact.  Psychiatric:        Attention and Perception: Attention normal.        Speech: Speech normal.        Behavior: Behavior normal.     ED Results / Procedures / Treatments   Labs (all labs ordered are listed, but only abnormal results are displayed) Labs Reviewed - No data to display  EKG None  Radiology No results found.  Procedures Procedures    Medications Ordered in ED Medications  diazepam (VALIUM) tablet 5 mg (has no administration in time range)    ED Course/ Medical Decision Making/ A&P                                 Medical Decision Making Amount and/or Complexity of Data Reviewed Labs: ordered. ECG/medicine tests: ordered.  Risk Prescription drug management.  Patient's first blood work done showed labs that were likely false.  Potassium of less than 2 low calcium.  I did an EKG which showed normal sinus rhythm.  No evidence of QT prolongation.  I subsequently ordered an i-STAT 8 which showed normal calcium as well as potassium.  Renal function also normal to.  Patient CK was normal.  Do not think she has any muscle wasting disease process. Patient given Valium here and feels slightly better.  Given Toradol as well 2.  Will discharge at this time        Final Clinical Impression(s) / ED Diagnoses Final diagnoses:  None    Rx / DC Orders ED Discharge Orders     None         Lorre Nick, MD 01/06/23 1352

## 2023-04-01 ENCOUNTER — Emergency Department (HOSPITAL_BASED_OUTPATIENT_CLINIC_OR_DEPARTMENT_OTHER)

## 2023-04-01 ENCOUNTER — Ambulatory Visit (HOSPITAL_BASED_OUTPATIENT_CLINIC_OR_DEPARTMENT_OTHER)
Admission: EM | Admit: 2023-04-01 | Discharge: 2023-04-02 | Disposition: A | Discharge status: Home / Self Care | Attending: Surgery | Admitting: Surgery

## 2023-04-01 ENCOUNTER — Other Ambulatory Visit: Payer: Self-pay

## 2023-04-01 ENCOUNTER — Encounter (HOSPITAL_BASED_OUTPATIENT_CLINIC_OR_DEPARTMENT_OTHER): Payer: Self-pay | Admitting: Emergency Medicine

## 2023-04-01 DIAGNOSIS — J45909 Unspecified asthma, uncomplicated: Secondary | ICD-10-CM | POA: Diagnosis not present

## 2023-04-01 DIAGNOSIS — K3580 Unspecified acute appendicitis: Secondary | ICD-10-CM | POA: Diagnosis present

## 2023-04-01 DIAGNOSIS — K353 Acute appendicitis with localized peritonitis, without perforation or gangrene: Secondary | ICD-10-CM | POA: Diagnosis not present

## 2023-04-01 LAB — COMPREHENSIVE METABOLIC PANEL
ALT: 13 U/L (ref 0–44)
AST: 12 U/L — ABNORMAL LOW (ref 15–41)
Albumin: 4.8 g/dL (ref 3.5–5.0)
Alkaline Phosphatase: 49 U/L (ref 38–126)
Anion gap: 13 (ref 5–15)
BUN: 10 mg/dL (ref 6–20)
CO2: 27 mmol/L (ref 22–32)
Calcium: 9.8 mg/dL (ref 8.9–10.3)
Chloride: 99 mmol/L (ref 98–111)
Creatinine, Ser: 0.74 mg/dL (ref 0.44–1.00)
GFR, Estimated: >60 mL/min (ref >60)
Glucose, Bld: 231 mg/dL — ABNORMAL HIGH (ref 70–99)
Potassium: 4.2 mmol/L (ref 3.5–5.1)
Sodium: 139 mmol/L (ref 135–145)
Total Bilirubin: 0.6 mg/dL (ref 0.0–1.2)
Total Protein: 8 g/dL (ref 6.5–8.1)

## 2023-04-01 LAB — CBC
HCT: 43.4 % (ref 36.0–46.0)
Hemoglobin: 14.8 g/dL (ref 12.0–15.0)
MCH: 29.7 pg (ref 26.0–34.0)
MCHC: 34.1 g/dL (ref 30.0–36.0)
MCV: 87.1 fL (ref 80.0–100.0)
Platelets: 281 10*3/uL (ref 150–400)
RBC: 4.98 MIL/uL (ref 3.87–5.11)
RDW: 13.5 % (ref 11.5–15.5)
WBC: 10.9 10*3/uL — ABNORMAL HIGH (ref 4.0–10.5)
nRBC: 0 % (ref 0.0–0.2)

## 2023-04-01 LAB — LIPASE, BLOOD: Lipase: <10 U/L — ABNORMAL LOW (ref 11–51)

## 2023-04-01 LAB — HCG, SERUM, QUALITATIVE: Preg, Serum: NEGATIVE

## 2023-04-01 MED ORDER — FENTANYL CITRATE PF 50 MCG/ML IJ SOSY
25.0000 ug | PREFILLED_SYRINGE | Freq: Once | INTRAMUSCULAR | Status: AC
  Administered 2023-04-02: 25 ug via INTRAVENOUS
  Filled 2023-04-01: qty 1

## 2023-04-01 MED ORDER — IOHEXOL 300 MG/ML  SOLN
100.0000 mL | Freq: Once | INTRAMUSCULAR | Status: AC | PRN
Start: 2023-04-01 — End: 2023-04-01
  Administered 2023-04-01: 100 mL via INTRAVENOUS

## 2023-04-01 NOTE — ED Triage Notes (Signed)
 Patient c/o generalized abd pain and hemoptysis that started this morning.

## 2023-04-01 NOTE — ED Provider Notes (Signed)
 [place] EMERGENCY DEPARTMENT AT [facility] Provider Note   CSN: [id number] Arrival date & time: 04/01/23  1744     History Chief Complaint  Patient presents with  . Abdominal Pain    Lisa Brown is a 33 y.o. female.  Patient most of her medical history presents emergency department today with concerns of abdominal pain.  She reports that she had development of generalized abdominal pain with multiple episodes of vomiting earlier today.  Endorsing some nausea.  Does report that she had some streaking of blood in her vomit but is not on any blood thinners.  Denies any feelings of weakness, lightheadedness, or dizziness.  Denies any recent dark stools.  No sick contacts as far she is aware.  She is not on blood thinners.   Abdominal Pain      Home Medications Prior to Admission medications   Medication Sig Start Date End Date Taking? Authorizing Provider  Blood Glucose Monitoring Suppl (ACCU-CHEK GUIDE) w/Device KIT Use glucose meter to monitor blood sugar 4 times daily Patient not taking: Reported on 07/18/2021 05/21/21   Constant, Peggy, MD  Blood Pressure Monitoring (BLOOD PRESSURE KIT) DEVI 1 Device by Does not apply route once a week. Patient not taking: Reported on 07/18/2021 12/28/20   Hermina Staggers, MD  Continuous Glucose Sensor (FREESTYLE LIBRE 3 SENSOR) MISC Change sensor every 14 days 04/26/22   Laurann Montana, MD  glucose blood (ACCU-CHEK GUIDE) test strip Use 1 strip to check blood sugar 4 times daily Patient not taking: Reported on 07/18/2021 05/21/21   Constant, Peggy, MD  hydrocortisone (ANUSOL-HC) 2.5 % rectal cream Place rectally 2 (two) times daily. 09/26/21   Anyanwu, Jethro Bastos, MD  hydrOXYzine (ATARAX) 10 MG tablet Take 1 tablet (10 mg total) by mouth 3 (three) times daily as needed. 08/12/21   Celedonio Savage, MD  ibuprofen (ADVIL) 600 MG tablet Take 1 tablet (600 mg total) by mouth every 6 (six) hours as needed. 01/06/23   Lorre Nick, MD  metFORMIN  (GLUCOPHAGE) 500 MG tablet Take 1 tablet (500 mg total) by mouth 2 (two) times daily with a meal. 04/23/22   Margarita Grizzle, MD  metFORMIN (GLUCOPHAGE) 500 MG tablet Take 1 tablet (500 mg total) by mouth 2 (two) times daily with a meal. 04/23/22   Margarita Grizzle, MD  methocarbamol (ROBAXIN) 500 MG tablet Take 1 tablet (500 mg total) by mouth 2 (two) times daily. 01/06/23   Lorre Nick, MD  Prenatal Vit-Fe Fumarate-FA (M-NATAL PLUS) 27-1 MG TABS TAKE 1 TABLET BY MOUTH EVERY DAY 02/12/22   Brock Bad, MD      Allergies    Patient has no known allergies.    Review of Systems   Review of Systems  Gastrointestinal:  Positive for abdominal pain.  All other systems reviewed and are negative.   Physical Exam Updated Vital Signs BP 113/86   Pulse 88   Temp (!) 96.9 F (36.1 C) (Temporal)   Resp 14   Wt 93 kg   SpO2 100%   BMI 28.59 kg/m  Physical Exam Vitals and nursing note reviewed.  Constitutional:      General: She is not in acute distress.    Appearance: She is well-developed.  HENT:     Head: Normocephalic and atraumatic.  Eyes:     Conjunctiva/sclera: Conjunctivae normal.  Cardiovascular:     Rate and Rhythm: Normal rate and regular rhythm.     Heart sounds: No murmur heard. Pulmonary:  Effort: Pulmonary effort is normal. No respiratory distress.     Breath sounds: Normal breath sounds.  Abdominal:     Palpations: Abdomen is soft.     Tenderness: There is generalized abdominal tenderness.  Musculoskeletal:        General: No swelling.     Cervical back: Neck supple.  Skin:    General: Skin is warm and dry.     Capillary Refill: Capillary refill takes less than 2 seconds.  Neurological:     Mental Status: She is alert.  Psychiatric:        Mood and Affect: Mood normal.    ED Results / Procedures / Treatments   Labs (all labs ordered are listed, but only abnormal results are displayed) Labs Reviewed  LIPASE, BLOOD - Abnormal; Notable for the following  components:      Result Value   Lipase <10 (*)    All other components within normal limits  COMPREHENSIVE METABOLIC PANEL - Abnormal; Notable for the following components:   Glucose, Bld 231 (*)    AST 12 (*)    All other components within normal limits  CBC - Abnormal; Notable for the following components:   WBC 10.9 (*)    All other components within normal limits  HCG, SERUM, QUALITATIVE  URINALYSIS, ROUTINE W REFLEX MICROSCOPIC    EKG None  Radiology No results found.  Procedures Procedures    Medications Ordered in ED Medications  iohexol (OMNIPAQUE) 300 MG/ML solution 100 mL (100 mLs Intravenous Contrast Given 04/01/23 2232)    ED Course/ Medical Decision Making/ A&P                                 Medical Decision Making Amount and/or Complexity of Data Reviewed Labs: ordered. Radiology: ordered.  Risk Prescription drug management.   This patient presents to the ED for concern of abdominal pain.  Differential diagnosis includes bowel obstruction, constipation, gastroenteritis, viral URI    Lab Tests:  I Ordered, and personally interpreted labs.  The pertinent results include: CBC with mild leukocytosis at 10.9, CMP unremarkable, hCG negative, lipase negative   Imaging Studies ordered:  I ordered imaging studies including CT abdominal pelvis I independently visualized and interpreted imaging which showed *** I agree with the radiologist interpretation   Medicines ordered and prescription drug management:  I ordered medication including fentanyl for pain Reevaluation of the patient after these medicines showed that the patient improved I have reviewed the patients home medicines and have made adjustments as needed   Problem List / ED Course:  Patient presents the ED today with concerns of abdominal pain.  No significant medical history as far as patient is aware.  She reports that she has had generalized abdominal pain since earlier today.   Multiple episodes of vomiting but denies any diarrhea.  No sick contacts as far she is aware.  Endorsing that she had some blood streaking in her vomit earlier but this has not returned.  Denies any feelings of weakness, dizziness, or lightheadedness.  Not on blood thinners. Physical exam reveals soft but generalized tenderness in the abdomen.  No focal abnormality palpated or appreciable mass.  Normal bowel sounds.  Obtain labs and imaging for assessment of patient's possible symptoms. Basic lab workup largely reassuring.  Mild leukocytosis at 10.9.  Otherwise unremarkable.  CT imaging is pending at this time.  Will administer a dose of fentanyl for pain  control.    Social Determinants of Health:    Final Clinical Impression(s) / ED Diagnoses Final diagnoses:  None    Rx / DC Orders ED Discharge Orders     None

## 2023-04-02 ENCOUNTER — Encounter (HOSPITAL_COMMUNITY): Payer: Self-pay

## 2023-04-02 ENCOUNTER — Encounter (HOSPITAL_COMMUNITY)
Admission: EM | Disposition: A | Discharge status: Home / Self Care | Payer: Self-pay | Source: Home / Self Care | Attending: Emergency Medicine

## 2023-04-02 ENCOUNTER — Other Ambulatory Visit: Payer: Self-pay

## 2023-04-02 ENCOUNTER — Emergency Department (HOSPITAL_COMMUNITY): Admitting: Anesthesiology

## 2023-04-02 ENCOUNTER — Emergency Department (HOSPITAL_BASED_OUTPATIENT_CLINIC_OR_DEPARTMENT_OTHER): Admitting: Anesthesiology

## 2023-04-02 DIAGNOSIS — K3580 Unspecified acute appendicitis: Secondary | ICD-10-CM | POA: Diagnosis present

## 2023-04-02 DIAGNOSIS — K37 Unspecified appendicitis: Secondary | ICD-10-CM | POA: Diagnosis not present

## 2023-04-02 DIAGNOSIS — J45909 Unspecified asthma, uncomplicated: Secondary | ICD-10-CM | POA: Diagnosis not present

## 2023-04-02 DIAGNOSIS — K353 Acute appendicitis with localized peritonitis, without perforation or gangrene: Secondary | ICD-10-CM | POA: Diagnosis not present

## 2023-04-02 HISTORY — PX: LAPAROSCOPIC APPENDECTOMY: SHX408

## 2023-04-02 LAB — URINALYSIS, ROUTINE W REFLEX MICROSCOPIC
Bacteria, UA: NONE SEEN
Bilirubin Urine: NEGATIVE
Glucose, UA: >1000 mg/dL — AB
Hgb urine dipstick: NEGATIVE
Ketones, ur: >80 mg/dL — AB
Leukocytes,Ua: NEGATIVE
Nitrite: NEGATIVE
Protein, ur: 30 mg/dL — AB
Specific Gravity, Urine: >1.046 — ABNORMAL HIGH (ref 1.005–1.030)
pH: 6 (ref 5.0–8.0)

## 2023-04-02 LAB — CBG MONITORING, ED: Glucose-Capillary: 248 mg/dL — ABNORMAL HIGH (ref 70–99)

## 2023-04-02 LAB — GLUCOSE, CAPILLARY
Glucose-Capillary: 221 mg/dL — ABNORMAL HIGH (ref 70–99)
Glucose-Capillary: 259 mg/dL — ABNORMAL HIGH (ref 70–99)
Glucose-Capillary: 203 mg/dL — ABNORMAL HIGH (ref 70–99)

## 2023-04-02 SURGERY — APPENDECTOMY, LAPAROSCOPIC
Anesthesia: General | Site: Abdomen

## 2023-04-02 MED ORDER — OXYCODONE HCL 5 MG/5ML PO SOLN: 5.0000 mg | Freq: Once | ORAL | Status: AC | PRN

## 2023-04-02 MED ORDER — FENTANYL CITRATE (PF) 250 MCG/5ML IJ SOLN
INTRAMUSCULAR | Status: DC | PRN
Start: 2023-04-02 — End: 2023-04-02
  Administered 2023-04-02 (×4): 50 ug via INTRAVENOUS

## 2023-04-02 MED ORDER — FENTANYL CITRATE (PF) 100 MCG/2ML IJ SOLN
INTRAMUSCULAR | Status: DC
Start: 2023-04-02 — End: 2023-04-02
  Filled 2023-04-02: qty 2

## 2023-04-02 MED ORDER — OXYCODONE HCL 5 MG PO TABS
ORAL_TABLET | ORAL | Status: AC
  Administered 2023-04-02: 5 mg via ORAL
  Filled 2023-04-02: qty 1

## 2023-04-02 MED ORDER — STERILE WATER FOR IRRIGATION IR SOLN
Status: DC | PRN
Start: 2023-04-02 — End: 2023-04-02
  Administered 2023-04-02: 1000 mL

## 2023-04-02 MED ORDER — FENTANYL CITRATE (PF) 100 MCG/2ML IJ SOLN: 50.0000 ug | Freq: Once | INTRAMUSCULAR | Status: AC

## 2023-04-02 MED ORDER — MIDAZOLAM HCL 2 MG/2ML IJ SOLN
INTRAMUSCULAR | Status: AC
  Filled 2023-04-02: qty 2

## 2023-04-02 MED ORDER — ORAL CARE MOUTH RINSE: 15.0000 mL | Freq: Once | OROMUCOSAL | Status: AC

## 2023-04-02 MED ORDER — SUCCINYLCHOLINE CHLORIDE 200 MG/10ML IV SOSY
PREFILLED_SYRINGE | INTRAVENOUS | Status: DC | PRN
  Administered 2023-04-02: 120 mg via INTRAVENOUS

## 2023-04-02 MED ORDER — FENTANYL CITRATE (PF) 100 MCG/2ML IJ SOLN: 25.0000 ug | INTRAMUSCULAR | Status: DC | PRN

## 2023-04-02 MED ORDER — CHLORHEXIDINE GLUCONATE 0.12 % MT SOLN
OROMUCOSAL | Status: AC
  Administered 2023-04-02: 15 mL via OROMUCOSAL
  Filled 2023-04-02: qty 15

## 2023-04-02 MED ORDER — CHLORHEXIDINE GLUCONATE CLOTH 2 % EX PADS: 6.0000 | MEDICATED_PAD | Freq: Once | CUTANEOUS | Status: DC

## 2023-04-02 MED ORDER — ACETAMINOPHEN 10 MG/ML IV SOLN
INTRAVENOUS | Status: AC
  Filled 2023-04-02: qty 100

## 2023-04-02 MED ORDER — HYDROCODONE-ACETAMINOPHEN 5-325 MG PO TABS: 1.0000 | ORAL_TABLET | Freq: Four times a day (QID) | ORAL | 0 refills | Status: AC | PRN

## 2023-04-02 MED ORDER — FENTANYL CITRATE PF 50 MCG/ML IJ SOSY
50.0000 ug | PREFILLED_SYRINGE | INTRAMUSCULAR | Status: DC | PRN
  Administered 2023-04-02 (×2): 50 ug via INTRAVENOUS
  Filled 2023-04-02 (×2): qty 1

## 2023-04-02 MED ORDER — ROCURONIUM BROMIDE 10 MG/ML (PF) SYRINGE
PREFILLED_SYRINGE | INTRAVENOUS | Status: DC | PRN
Start: 2023-04-02 — End: 2023-04-02
  Administered 2023-04-02: 50 mg via INTRAVENOUS

## 2023-04-02 MED ORDER — PROPOFOL 10 MG/ML IV BOLUS
INTRAVENOUS | Status: AC
  Filled 2023-04-02: qty 20

## 2023-04-02 MED ORDER — INSULIN ASPART 100 UNIT/ML IJ SOLN
0.0000 [IU] | INTRAMUSCULAR | Status: DC | PRN
  Administered 2023-04-02: 6 [IU] via SUBCUTANEOUS
  Filled 2023-04-02: qty 1

## 2023-04-02 MED ORDER — ACETAMINOPHEN 10 MG/ML IV SOLN
1000.0000 mg | Freq: Once | INTRAVENOUS | Status: AC
  Administered 2023-04-02: 1000 mg via INTRAVENOUS

## 2023-04-02 MED ORDER — PIPERACILLIN-TAZOBACTAM 3.375 G IVPB 30 MIN
3.3750 g | Freq: Once | INTRAVENOUS | Status: AC
  Administered 2023-04-02: 3.375 g via INTRAVENOUS
  Filled 2023-04-02: qty 50

## 2023-04-02 MED ORDER — PIPERACILLIN-TAZOBACTAM 3.375 G IVPB
3.3750 g | Freq: Three times a day (TID) | INTRAVENOUS | Status: DC
  Administered 2023-04-02: 3.375 g via INTRAVENOUS
  Filled 2023-04-02: qty 50

## 2023-04-02 MED ORDER — PROPOFOL 10 MG/ML IV BOLUS
INTRAVENOUS | Status: DC | PRN
  Administered 2023-04-02: 200 mg via INTRAVENOUS

## 2023-04-02 MED ORDER — CHLORHEXIDINE GLUCONATE 0.12 % MT SOLN
15.0000 mL | Freq: Once | OROMUCOSAL | Status: AC
  Filled 2023-04-02: qty 15

## 2023-04-02 MED ORDER — FENTANYL CITRATE (PF) 100 MCG/2ML IJ SOLN
INTRAMUSCULAR | Status: AC
  Administered 2023-04-02: 50 ug via INTRAVENOUS
  Filled 2023-04-02: qty 2

## 2023-04-02 MED ORDER — DEXAMETHASONE SODIUM PHOSPHATE 10 MG/ML IJ SOLN
INTRAMUSCULAR | Status: DC | PRN
  Administered 2023-04-02: 5 mg via INTRAVENOUS

## 2023-04-02 MED ORDER — INSULIN ASPART 100 UNIT/ML IJ SOLN
INTRAMUSCULAR | Status: AC
  Filled 2023-04-02: qty 1

## 2023-04-02 MED ORDER — 0.9 % SODIUM CHLORIDE (POUR BTL) OPTIME
TOPICAL | Status: DC | PRN
  Administered 2023-04-02: 1000 mL

## 2023-04-02 MED ORDER — MIDAZOLAM HCL 2 MG/2ML IJ SOLN
INTRAMUSCULAR | Status: DC | PRN
  Administered 2023-04-02: 2 mg via INTRAVENOUS

## 2023-04-02 MED ORDER — METRONIDAZOLE 500 MG/100ML IV SOLN
500.0000 mg | Freq: Three times a day (TID) | INTRAVENOUS | Status: DC
  Administered 2023-04-02: 500 mg via INTRAVENOUS
  Filled 2023-04-02: qty 100

## 2023-04-02 MED ORDER — FENTANYL CITRATE (PF) 250 MCG/5ML IJ SOLN
INTRAMUSCULAR | Status: AC
  Filled 2023-04-02: qty 5

## 2023-04-02 MED ORDER — BUPIVACAINE-EPINEPHRINE (PF) 0.25% -1:200000 IJ SOLN
INTRAMUSCULAR | Status: AC
  Filled 2023-04-02: qty 30

## 2023-04-02 MED ORDER — ONDANSETRON HCL 4 MG/2ML IJ SOLN
4.0000 mg | Freq: Once | INTRAMUSCULAR | Status: AC
  Administered 2023-04-02: 4 mg via INTRAVENOUS
  Filled 2023-04-02: qty 2

## 2023-04-02 MED ORDER — LIDOCAINE 2% (20 MG/ML) 5 ML SYRINGE
INTRAMUSCULAR | Status: DC | PRN
Start: 2023-04-02 — End: 2023-04-02
  Administered 2023-04-02: 100 mg via INTRAVENOUS

## 2023-04-02 MED ORDER — OXYCODONE HCL 5 MG PO TABS: 5.0000 mg | ORAL_TABLET | Freq: Once | ORAL | Status: AC | PRN

## 2023-04-02 MED ORDER — AMISULPRIDE (ANTIEMETIC) 5 MG/2ML IV SOLN: 10.0000 mg | Freq: Once | INTRAVENOUS | Status: DC | PRN

## 2023-04-02 MED ORDER — LACTATED RINGERS IV BOLUS
1000.0000 mL | Freq: Once | INTRAVENOUS | Status: AC
  Administered 2023-04-02: 1000 mL via INTRAVENOUS

## 2023-04-02 MED ORDER — LACTATED RINGERS IV SOLN: INTRAVENOUS | Status: DC

## 2023-04-02 MED ORDER — BUPIVACAINE-EPINEPHRINE 0.25% -1:200000 IJ SOLN
INTRAMUSCULAR | Status: DC | PRN
  Administered 2023-04-02: 25 mL

## 2023-04-02 MED ORDER — ONDANSETRON HCL 4 MG/2ML IJ SOLN
INTRAMUSCULAR | Status: DC | PRN
  Administered 2023-04-02: 4 mg via INTRAVENOUS

## 2023-04-02 MED ORDER — SODIUM CHLORIDE 0.9 % IV SOLN
2.0000 g | INTRAVENOUS | Status: AC
  Administered 2023-04-02: 2 g via INTRAVENOUS
  Filled 2023-04-02: qty 20

## 2023-04-02 MED ORDER — SUGAMMADEX SODIUM 200 MG/2ML IV SOLN
INTRAVENOUS | Status: DC | PRN
  Administered 2023-04-02: 200 mg via INTRAVENOUS

## 2023-04-02 SURGICAL SUPPLY — 40 items
APPLIER CLIP 5 13 M/L LIGAMAX5 (MISCELLANEOUS) IMPLANT
BAG COUNTER SPONGE SURGICOUNT (BAG) ×1 IMPLANT
BLADE CLIPPER SURG (BLADE) IMPLANT
CANISTER SUCT 3000ML PPV (MISCELLANEOUS) ×1 IMPLANT
CHLORAPREP W/TINT 26 (MISCELLANEOUS) ×2 IMPLANT
CLIP APPLIE 5 13 M/L LIGAMAX5 (MISCELLANEOUS) IMPLANT
COVER SURGICAL LIGHT HANDLE (MISCELLANEOUS) ×2 IMPLANT
CUTTER FLEX LINEAR 45M (STAPLE) IMPLANT
DERMABOND ADVANCED .7 DNX12 (GAUZE/BANDAGES/DRESSINGS) ×2 IMPLANT
ELECT REM PT RETURN 9FT ADLT (ELECTROSURGICAL) ×1 IMPLANT
ELECTRODE REM PT RTRN 9FT ADLT (ELECTROSURGICAL) ×1 IMPLANT
ENDOLOOP SUT PDS II 0 18 (SUTURE) IMPLANT
GLOVE BIOGEL PI IND STRL 6 (GLOVE) ×1 IMPLANT
GLOVE BIOGEL PI MICRO STRL 5.5 (GLOVE) ×2 IMPLANT
GOWN STRL REUS W/ TWL LRG LVL3 (GOWN DISPOSABLE) ×3 IMPLANT
IRRIG SUCT STRYKERFLOW 2 WTIP (MISCELLANEOUS) ×1 IMPLANT
IRRIGATION SUCT STRKRFLW 2 WTP (MISCELLANEOUS) IMPLANT
KIT BASIN OR (CUSTOM PROCEDURE TRAY) ×2 IMPLANT
KIT TURNOVER KIT B (KITS) ×1 IMPLANT
NS IRRIG 1000ML POUR BTL (IV SOLUTION) ×1 IMPLANT
PAD ARMBOARD 7.5X6 YLW CONV (MISCELLANEOUS) ×2 IMPLANT
PENCIL BUTTON HOLSTER BLD 10FT (ELECTRODE) ×1 IMPLANT
RELOAD STAPLE 45 3.5 BLU ETS (ENDOMECHANICALS) IMPLANT
RELOAD STAPLE TA45 3.5 REG BLU (ENDOMECHANICALS) ×2 IMPLANT
SCISSORS LAP 5X35 DISP (ENDOMECHANICALS) IMPLANT
SET TUBE SMOKE EVAC HIGH FLOW (TUBING) ×2 IMPLANT
SHEARS HARMONIC ACE PLUS 36CM (ENDOMECHANICALS) IMPLANT
SLEEVE Z-THREAD 5X100MM (TROCAR) ×2 IMPLANT
SPECIMEN JAR SMALL (MISCELLANEOUS) ×1 IMPLANT
SUT MNCRL AB 4-0 PS2 18 (SUTURE) ×1 IMPLANT
SYS BAG RETRIEVAL 10MM (BASKET) ×1 IMPLANT
SYSTEM BAG RETRIEVAL 10MM (BASKET) ×1 IMPLANT
TOWEL GREEN STERILE (TOWEL DISPOSABLE) ×1 IMPLANT
TOWEL GREEN STERILE FF (TOWEL DISPOSABLE) ×1 IMPLANT
TRAY FOLEY W/BAG SLVR 14FR (SET/KITS/TRAYS/PACK) IMPLANT
TRAY LAPAROSCOPIC MC (CUSTOM PROCEDURE TRAY) ×2 IMPLANT
TROCAR BALLN 12MMX100 BLUNT (TROCAR) ×2 IMPLANT
TROCAR Z-THREAD OPTICAL 5X100M (TROCAR) ×1 IMPLANT
WARMER LAPAROSCOPE (MISCELLANEOUS) ×2 IMPLANT
WATER STERILE IRR 1000ML POUR (IV SOLUTION) ×1 IMPLANT

## 2023-04-02 NOTE — Op Note (Signed)
 Date: 04/02/23  Patient: Lisa Brown MRN: [id number]  Preoperative Diagnosis: Acute appendicitis Postoperative Diagnosis: Same  Procedure: Laparoscopic appendectomy  Surgeon: Sophronia Simas, MD  EBL: Minimal  Anesthesia: General endotracheal  Specimens: Appendix  Indications: [name] is a 33 yo female who presented with acute abdominal pain that began yesterday, with mild leukocytosis. A CT scan showed acute uncomplicated appendicitis. After a discussion of the risks and benefits of surgery, she agreed to proceed with appendectomy.  Findings: Acute appendicitis without perforation or abscess. Retrocecal appendix.  Procedure details: Informed consent was obtained in the preoperative area prior to the procedure. The patient was brought to the operating room and placed on the table in the supine position. General anesthesia was induced and appropriate lines and drains were placed for intraoperative monitoring. Perioperative antibiotics were administered per SCIP guidelines. The abdomen was prepped and draped in the usual sterile fashion. A pre-procedure timeout was taken verifying patient identity, surgical site and procedure to be performed.  A small infraumbilical skin incision was made and the subcutaneous tissue was spread to expose the fascia. The umbilical stalk was grasped and elevated, and the fascia was sharply incised. The peritoneal cavity was visualized and a 12mm Hasson trocar was inserted. The abdomen was insufflated and inspected with no evidence of visceral or vascular injury. A suprapubic 5mm port was placed, followed by a 5mm port in the LLQ, both under direct visualization. The appendix was not immediately visible. The cecum was identified and mobilized medially along the line of Toldt using Harmonic shears. The base of the appendix then became visible, and the remainder of the appendix appeared curled on itself in the retrocecal space. A mesenteric window was bluntly created  at the base of the appendix, and the appendix was divided at the base from the cecum using a 45mm stapler with a blue load. The mesoappendix was divided with the harmonic, and the remainder of the appendix was dissected off the retroperitoneum using harmonic shears. The specimen was placed in an endocatch bag. The staple lines was inspected and appeared in tact with no bleeding or leakage. The ports were removed and the pneumoperitoneum was evacuated. The specimen was extracted via the umbilical port site and sent for routine pathology. The umbilical port site fascia was closed with an 0 Vicryl pursestring suture. The skin at all port sites was closed with 4-0 monocryl subcuticular suture. Dermabond was applied.  The patient tolerated the procedure well with no apparent complications. All counts were correct x2 at the end of the procedure. The patient was extubated and taken to PACU in stable condition.  Sophronia Simas, MD 04/02/23 12:26 PM

## 2023-04-02 NOTE — Discharge Summary (Signed)
 [facility] Discharge Summary   Patient ID: Lisa Brown MRN: [id number] DOB/AGE: 1990-06-17 33 y.o.  Admit date: 04/01/2023 Discharge date: 04/02/2023  Admitting Diagnosis: Acute appendicitis  Discharge Diagnosis S/P laparoscopic appendectomy   Consultants None   Imaging: CT ABDOMEN PELVIS W CONTRAST Result Date: 04/02/2023 CLINICAL DATA:  Acute nonlocalized abdominal pain, hemoptysis EXAM: CT ABDOMEN AND PELVIS WITH CONTRAST TECHNIQUE: Multidetector CT imaging of the abdomen and pelvis was performed using the standard protocol following bolus administration of intravenous contrast. RADIATION DOSE REDUCTION: This exam was performed according to the departmental dose-optimization program which includes automated exposure control, adjustment of the mA and/or kV according to patient size and/or use of iterative reconstruction technique. CONTRAST:  OMNIPAQUE IOHEXOL 300 MG/ML  SOLN COMPARISON:  None Available. FINDINGS: Lower chest: Ground-glass pulmonary infiltrate is seen within the basilar right middle lobe which may represent infectious or inflammatory infiltrate or potentially hemorrhage given the history of hemoptysis. Hepatobiliary: No focal liver abnormality is seen. No gallstones, gallbladder wall thickening, or biliary dilatation. Pancreas: Unremarkable Spleen: Unremarkable Adrenals/Urinary Tract: Adrenal glands are unremarkable. Kidneys are normal, without renal calculi, focal lesion, or hydronephrosis. Bladder is unremarkable. Stomach/Bowel: The appendix is dilated with and hyperemic and there is moderate periappendiceal inflammatory stranding in keeping with changes of acute appendicitis. The appendix is retrocecal in location. No periappendiceal fluid collection. No free intraperitoneal gas. Small free fluid within pelvis is nonspecific. No appendicoliths identified. Stomach, small bowel, and large bowel are otherwise unremarkable. Vascular/Lymphatic: No significant  vascular findings are present. No enlarged abdominal or pelvic lymph nodes. Reproductive: Uterus and bilateral adnexa are unremarkable. Other: No abdominal wall hernia Musculoskeletal: No acute or significant osseous findings. IMPRESSION: 1. Acute unruptured appendicitis. Small free fluid within pelvis is nonspecific. 2. Ground-glass pulmonary infiltrate within the basilar right middle lobe which may represent infectious or inflammatory infiltrate or potentially hemorrhage given the history of hemoptysis. Electronically Signed   By: Helyn Numbers M.D.   On: 04/02/2023 00:25    Procedures Dr. Sophronia Simas (04/02/23) - Laparoscopic Appendectomy  Hospital Course:  Patient is a 33 year old female who presented to [facility] with abdominal pain.  Workup showed acute appendicitis.  Patient was transferred to [facility] and underwent procedure listed above.  Tolerated procedure well and discharged from PACU.  Patient will follow up in our office in 3-4 weeks and knows to call with questions or concerns. She will call to confirm appointment date/time.    I or a member of my team have reviewed this patient in the Controlled Substance Database.   Allergies as of 04/02/2023   No Known Allergies      Medication List     TAKE these medications    HYDROcodone-acetaminophen 5-325 MG tablet Commonly known as: NORCO/VICODIN Take 1 tablet by mouth every 6 (six) hours as needed for moderate pain (pain score 4-6).   hydrocortisone 2.5 % rectal cream Commonly known as: ANUSOL-HC Place rectally 2 (two) times daily.   hydrOXYzine 10 MG tablet Commonly known as: ATARAX Take 1 tablet (10 mg total) by mouth 3 (three) times daily as needed.   ibuprofen 600 MG tablet Commonly known as: ADVIL Take 1 tablet (600 mg total) by mouth every 6 (six) hours as needed.   M-Natal Plus 27-1 MG Tabs TAKE 1 TABLET BY MOUTH EVERY DAY          Follow-up Information     Maczis, Hedda Slade, PA-C. Go on 05/01/2023.    Specialty: General Surgery  Why: 1:45 PM, please arrive 30 min prior to appointment time to check in. Contact information: [address] [place] [address] [facility] [place] [address] [phone]                 Signed: Juliet Rude , [facility] 04/02/2023, 12:30 PM Please see Amion for pager number during day hours 7:00am-4:30pm

## 2023-04-02 NOTE — Transfer of Care (Signed)
 Immediate Anesthesia Transfer of Care Note  Patient: [name]  Procedure(s) Performed: APPENDECTOMY, LAPAROSCOPIC (Abdomen)  Patient Location: PACU  Anesthesia Type:General  Level of Consciousness: drowsy and patient cooperative  Airway & Oxygen Therapy: Patient Spontanous Breathing and Patient connected to nasal cannula oxygen  Post-op Assessment: Report given to RN, Post -op Vital signs reviewed and stable, and Patient moving all extremities X 4  Post vital signs: Reviewed and stable  Last Vitals:  Vitals Value Taken Time  BP 134/95 04/02/23 1234  Temp    Pulse 87 04/02/23 1236  Resp 16 04/02/23 1236  SpO2 98 % 04/02/23 1236  Vitals shown include unfiled device data.  Last Pain:  Vitals:   04/02/23 1054  TempSrc:   PainSc: 9          Complications: No notable events documented.

## 2023-04-02 NOTE — Anesthesia Preprocedure Evaluation (Addendum)
 Anesthesia Evaluation  Patient identified by MRN, date of birth, ID band Patient awake    Reviewed: Allergy & Precautions, NPO status , Patient's Chart, lab work & pertinent test results  History of Anesthesia Complications Negative for: history of anesthetic complications  Airway Mallampati: I  TM Distance: >3 FB Neck ROM: Full    Dental  (+) Dental Advisory Given   Pulmonary neg shortness of breath, asthma (does not use inhalers) , neg sleep apnea, neg COPD, neg recent URI, Patient abstained from smoking.   Pulmonary exam normal breath sounds clear to auscultation       Cardiovascular negative cardio ROS  Rhythm:Regular Rate:Normal  TTE 04/05/2021: IMPRESSIONS     1. Left ventricular ejection fraction, by estimation, is 60 to 65%. The  left ventricle has normal function. The left ventricle has no regional  wall motion abnormalities. Left ventricular diastolic parameters were  normal.   2. Right ventricular systolic function is normal. The right ventricular  size is normal.   3. The mitral valve is normal in structure. No evidence of mitral valve  regurgitation. No evidence of mitral stenosis.   4. The aortic valve is normal in structure. Aortic valve regurgitation is  not visualized. No aortic stenosis is present.   5. The inferior vena cava is normal in size with greater than 50%  respiratory variability, suggesting right atrial pressure of 3 mmHg.     Neuro/Psych  Headaches, neg Seizures    GI/Hepatic negative GI ROS, Neg liver ROS,,,Acute appendicitis   Endo/Other  diabetes (reports this was gestational)    Renal/GU negative Renal ROS     Musculoskeletal   Abdominal   Peds  Hematology negative hematology ROS (+) Lab Results      Component                Value               Date                      WBC                      10.9 (H)            04/01/2023                HGB                      14.8                 04/01/2023                HCT                      43.4                04/01/2023                MCV                      87.1                04/01/2023                PLT                      281  04/01/2023              Anesthesia Other Findings   Reproductive/Obstetrics                             Anesthesia Physical Anesthesia Plan  ASA: 2  Anesthesia Plan: General   Post-op Pain Management: Ofirmev IV (intra-op)*   Induction: Intravenous, Rapid sequence and Cricoid pressure planned  PONV Risk Score and Plan: 3 and Ondansetron, Dexamethasone and Treatment may vary due to age or medical condition  Airway Management Planned: Oral ETT  Additional Equipment:   Intra-op Plan:   Post-operative Plan: Extubation in OR  Informed Consent: I have reviewed the patients History and Physical, chart, labs and discussed the procedure including the risks, benefits and alternatives for the proposed anesthesia with the patient or authorized representative who has indicated his/her understanding and acceptance.     Dental advisory given  Plan Discussed with: Anesthesiologist and CRNA  Anesthesia Plan Comments: (Risks of general anesthesia discussed including, but not limited to, sore throat, hoarse voice, chipped/damaged teeth, injury to vocal cords, nausea and vomiting, allergic reactions, lung infection, heart attack, stroke, and death. All questions answered. )        Anesthesia Quick Evaluation

## 2023-04-02 NOTE — Progress Notes (Signed)
 ED Pharmacy Antibiotic Sign Off An antibiotic consult was received from an ED provider for zosyn per pharmacy dosing for intra-abdominal. A chart review was completed to assess appropriateness.   The following one time order(s) were placed:  Zosyn 3.375g  Further antibiotic and/or antibiotic pharmacy consults should be ordered by the admitting provider if indicated.   Thank you for allowing pharmacy to be a part of this patient's care.   Marja Kays, [facility]  Clinical Pharmacist 04/02/23 12:42 AM

## 2023-04-02 NOTE — Progress Notes (Signed)
 Patient states her last A1c was greater than 10, a year ago.   States she was prescribed Metformin, but never started taking it because she was breastfeeding at the time. Patient states that she was able to manage her blood sugar (maintaining a level <110) with diet and exercise. She was checking her blood sugar using a Libre.  Today CBG 248 at [facility] at 0749. Rechecked blood sugar upon arrival: CBG 221 at 0857.  Dr. Isaias Cowman notified. Instructed to hold insulin protocol and recheck blood sugar at 1057.

## 2023-04-02 NOTE — ED Provider Notes (Signed)
  Physical Exam  BP 113/86   Pulse 88   Temp (!) 96.9 F (36.1 C) (Temporal)   Resp 14   Wt 93 kg   SpO2 100%   BMI 28.59 kg/m   Physical Exam  Procedures  Procedures  ED Course / MDM    Medical Decision Making Amount and/or Complexity of Data Reviewed Labs: ordered. Radiology: ordered.  Risk Prescription drug management. Decision regarding hospitalization.   47F presenting with abdominal pain, nausea and vomiting, small streak of hematemesis, likely mallory weiss. CT looks like constipation, read pending. PO challenge after CT.    CT shows acute appendicitis: IMPRESSION:  1. Acute unruptured appendicitis. Small free fluid within pelvis is  nonspecific.  2. Ground-glass pulmonary infiltrate within the basilar right middle  lobe which may represent infectious or inflammatory infiltrate or  potentially hemorrhage given the history of hemoptysis.    General surgery consulted. Dr. Dossie Der a recommended admission to the general surgery floor.  Patient admitted in stable condition and updated regarding the plan of care.   Ernie Avena, MD 04/02/23 [phone]

## 2023-04-02 NOTE — Anesthesia Procedure Notes (Signed)
 Procedure Name: Intubation Date/Time: 04/02/2023 11:52 AM  Performed by: Alease Medina, CRNAPre-anesthesia Checklist: Patient identified, Emergency Drugs available, Suction available and Patient being monitored Patient Re-evaluated:Patient Re-evaluated prior to induction Oxygen Delivery Method: Circle system utilized Preoxygenation: Pre-oxygenation with 100% oxygen Induction Type: IV induction, Rapid sequence and Cricoid Pressure applied Laryngoscope Size: Mac and 3 Grade View: Grade I Tube type: Oral Tube size: 7.0 mm Number of attempts: 1 Airway Equipment and Method: Stylet and Oral airway Placement Confirmation: ETT inserted through vocal cords under direct vision, positive ETCO2 and breath sounds checked- equal and bilateral Secured at: 21 cm Tube secured with: Tape Dental Injury: Teeth and Oropharynx as per pre-operative assessment

## 2023-04-02 NOTE — ED Notes (Signed)
 Pt c/o nausea, EDP made aware Pain 8/10

## 2023-04-02 NOTE — ED Notes (Signed)
 Pt reports generalized abdominal pain all day.   Also hemoptysis  Pt states she does at times have constipation, but has always been that way Denies n/v

## 2023-04-02 NOTE — H&P (Signed)
 Lisa Brown [birth year]  [id number].     HPI:  Lisa Brown is a 33 yo female who presented to the ED with acute onset abdominal pain. The pain began yesterday, and is mostly in the center of her abdomen. It is associated with nausea and vomiting. WBC is mildly elevated at 10.9. A CT scan showed acute appendicitis without perforation.  The patient has not had any prior abdominal surgeries.  ROS: Review of Systems  Constitutional:  Positive for malaise/fatigue.  Gastrointestinal:  Positive for abdominal pain, nausea and vomiting.    Family History  Problem Relation Age of Onset   Diabetes Mother    Diabetes Father    Kidney disease Father    Hypertension Father    Liver disease Father    Cancer Maternal Grandfather    Diabetes Maternal Grandfather    Hypertension Paternal Grandmother     Past Medical History:  Diagnosis Date   Asthma    GDM, class A1 08/09/2021   Normal PP 2 hr GTT in 2023   Gestational diabetes 03/2022   Headache     Past Surgical History:  Procedure Laterality Date   NO PAST SURGERIES      Social History:  reports that she has never smoked. She has never used smokeless tobacco. She reports that she does not drink alcohol and does not use drugs.  Allergies: No Known Allergies  Medications Prior to Admission  Medication Sig Dispense Refill   Blood Glucose Monitoring Suppl (ACCU-CHEK GUIDE) w/Device KIT Use glucose meter to monitor blood sugar 4 times daily (Patient not taking: Reported on 07/18/2021) 1 kit 0   Blood Pressure Monitoring (BLOOD PRESSURE KIT) DEVI 1 Device by Does not apply route once a week. (Patient not taking: Reported on 07/18/2021) 1 each 0   Continuous Glucose Sensor (FREESTYLE LIBRE 3 SENSOR) MISC Change sensor every 14 days 6 each 3   glucose blood (ACCU-CHEK GUIDE) test strip Use 1 strip to check blood sugar 4 times daily (Patient not taking: Reported on 07/18/2021) 100 each 6   hydrocortisone (ANUSOL-HC) 2.5 % rectal cream Place  rectally 2 (two) times daily. 30 g 2   hydrOXYzine (ATARAX) 10 MG tablet Take 1 tablet (10 mg total) by mouth 3 (three) times daily as needed. 15 tablet 0   ibuprofen (ADVIL) 600 MG tablet Take 1 tablet (600 mg total) by mouth every 6 (six) hours as needed. 30 tablet 0   metFORMIN (GLUCOPHAGE) 500 MG tablet Take 1 tablet (500 mg total) by mouth 2 (two) times daily with a meal. (Patient not taking: Reported on 04/02/2023) 60 tablet 0   metFORMIN (GLUCOPHAGE) 500 MG tablet Take 1 tablet (500 mg total) by mouth 2 (two) times daily with a meal. (Patient not taking: Reported on 04/02/2023) 60 tablet 0   methocarbamol (ROBAXIN) 500 MG tablet Take 1 tablet (500 mg total) by mouth 2 (two) times daily. 20 tablet 0   Prenatal Vit-Fe Fumarate-FA (M-NATAL PLUS) 27-1 MG TABS TAKE 1 TABLET BY MOUTH EVERY DAY 30 tablet 13     Physical Exam: Blood pressure (P) 117/72, pulse (P) 80, temperature (P) 98.1 F (36.7 C), temperature source (P) Oral, resp. rate (P) 18, height (P) 5\' 11"  (1.803 m), weight (P) 93 kg, SpO2 (P) 100%, unknown if currently breastfeeding. General: resting comfortably, appears stated age, no apparent distress Neurological: alert and oriented, no focal deficits HEENT: normocephalic, atraumatic Respiratory: normal work of breathing on room air Abdomen: soft, nondistended, mild focal tenderness  in the RLQ. No surgical scars. Extremities: warm and well-perfused, no deformities, moving all extremities spontaneously Psychiatric: normal mood and affect Skin: warm and dry, no jaundice, no rashes or lesions   Results for orders placed or performed during the hospital encounter of 04/01/23 (from the past 48 hours)  Lipase, blood     Status: Abnormal   Collection Time: 04/01/23  5:55 PM  Result Value Ref Range   Lipase <10 (L) 11 - 51 U/L    Comment: Performed at [facility], [address], [place], [place] [address]  Comprehensive metabolic panel     Status: Abnormal    Collection Time: 04/01/23  5:55 PM  Result Value Ref Range   Sodium 139 135 - 145 mmol/L   Potassium 4.2 3.5 - 5.1 mmol/L   Chloride 99 98 - 111 mmol/L   CO2 27 22 - 32 mmol/L   Glucose, Bld 231 (H) 70 - 99 mg/dL    Comment: Glucose reference range applies only to samples taken after fasting for at least 8 hours.   BUN 10 6 - 20 mg/dL   Creatinine, Ser 1.30 0.44 - 1.00 mg/dL   Calcium 9.8 8.9 - 86.5 mg/dL   Total Protein 8.0 6.5 - 8.1 g/dL   Albumin 4.8 3.5 - 5.0 g/dL   AST 12 (L) 15 - 41 U/L   ALT 13 0 - 44 U/L   Alkaline Phosphatase 49 38 - 126 U/L   Total Bilirubin 0.6 0.0 - 1.2 mg/dL   GFR, Estimated >78 >46 mL/min    Comment: (NOTE) Calculated using the CKD-EPI Creatinine Equation (2021)    Anion gap 13 5 - 15    Comment: Performed at [facility], [address] [place], [place] [address]  CBC     Status: Abnormal   Collection Time: 04/01/23  5:55 PM  Result Value Ref Range   WBC 10.9 (H) 4.0 - 10.5 K/uL   RBC 4.98 3.87 - 5.11 MIL/uL   Hemoglobin 14.8 12.0 - 15.0 g/dL   HCT 28.4 13.2 - 44.0 %   MCV 87.1 80.0 - 100.0 fL   MCH 29.7 26.0 - 34.0 pg   MCHC 34.1 30.0 - 36.0 g/dL   RDW 10.2 72.5 - 36.6 %   Platelets 281 150 - 400 K/uL   nRBC 0.0 0.0 - 0.2 %    Comment: Performed at [facility], [address], [place], [place] [address]  hCG, serum, qualitative     Status: None   Collection Time: 04/01/23  5:55 PM  Result Value Ref Range   Preg, Serum NEGATIVE NEGATIVE    Comment:        THE SENSITIVITY OF THIS METHODOLOGY IS >10 mIU/mL. Performed at [facility], [address] [place], [place] [address]   Urinalysis, Routine w reflex microscopic -Urine, Clean Catch     Status: Abnormal   Collection Time: 04/02/23  1:12 AM  Result Value Ref Range   Color, Urine YELLOW YELLOW   APPearance CLEAR CLEAR   Specific Gravity, Urine >1.046 (H) 1.005 - 1.030   pH 6.0 5.0 - 8.0   Glucose, UA >1,000 (A) NEGATIVE  mg/dL   Hgb urine dipstick NEGATIVE NEGATIVE   Bilirubin Urine NEGATIVE NEGATIVE   Ketones, ur >80 (A) NEGATIVE mg/dL   Protein, ur 30 (A) NEGATIVE mg/dL   Nitrite NEGATIVE NEGATIVE   Leukocytes,Ua NEGATIVE NEGATIVE   RBC / HPF 0-5 0 - 5 RBC/hpf   WBC, UA  0-5 0 - 5 WBC/hpf   Bacteria, UA NONE SEEN NONE SEEN   Squamous Epithelial / HPF 0-5 0 - 5 /HPF   Mucus PRESENT     Comment: Performed at [facility], [address], [place], [place] [address]  CBG monitoring, ED     Status: Abnormal   Collection Time: 04/02/23  7:49 AM  Result Value Ref Range   Glucose-Capillary 248 (H) 70 - 99 mg/dL    Comment: Glucose reference range applies only to samples taken after fasting for at least 8 hours.  Glucose, capillary     Status: Abnormal   Collection Time: 04/02/23  8:57 AM  Result Value Ref Range   Glucose-Capillary 221 (H) 70 - 99 mg/dL    Comment: Glucose reference range applies only to samples taken after fasting for at least 8 hours.  Glucose, capillary     Status: Abnormal   Collection Time: 04/02/23 10:38 AM  Result Value Ref Range   Glucose-Capillary 259 (H) 70 - 99 mg/dL    Comment: Glucose reference range applies only to samples taken after fasting for at least 8 hours.   CT ABDOMEN PELVIS W CONTRAST Result Date: 04/02/2023 CLINICAL DATA:  Acute nonlocalized abdominal pain, hemoptysis EXAM: CT ABDOMEN AND PELVIS WITH CONTRAST TECHNIQUE: Multidetector CT imaging of the abdomen and pelvis was performed using the standard protocol following bolus administration of intravenous contrast. RADIATION DOSE REDUCTION: This exam was performed according to the departmental dose-optimization program which includes automated exposure control, adjustment of the mA and/or kV according to patient size and/or use of iterative reconstruction technique. CONTRAST:  OMNIPAQUE IOHEXOL 300 MG/ML  SOLN COMPARISON:  None Available. FINDINGS: Lower chest: Ground-glass pulmonary  infiltrate is seen within the basilar right middle lobe which may represent infectious or inflammatory infiltrate or potentially hemorrhage given the history of hemoptysis. Hepatobiliary: No focal liver abnormality is seen. No gallstones, gallbladder wall thickening, or biliary dilatation. Pancreas: Unremarkable Spleen: Unremarkable Adrenals/Urinary Tract: Adrenal glands are unremarkable. Kidneys are normal, without renal calculi, focal lesion, or hydronephrosis. Bladder is unremarkable. Stomach/Bowel: The appendix is dilated with and hyperemic and there is moderate periappendiceal inflammatory stranding in keeping with changes of acute appendicitis. The appendix is retrocecal in location. No periappendiceal fluid collection. No free intraperitoneal gas. Small free fluid within pelvis is nonspecific. No appendicoliths identified. Stomach, small bowel, and large bowel are otherwise unremarkable. Vascular/Lymphatic: No significant vascular findings are present. No enlarged abdominal or pelvic lymph nodes. Reproductive: Uterus and bilateral adnexa are unremarkable. Other: No abdominal wall hernia Musculoskeletal: No acute or significant osseous findings. IMPRESSION: 1. Acute unruptured appendicitis. Small free fluid within pelvis is nonspecific. 2. Ground-glass pulmonary infiltrate within the basilar right middle lobe which may represent infectious or inflammatory infiltrate or potentially hemorrhage given the history of hemoptysis. Electronically Signed   By: Helyn Numbers M.D.   On: 04/02/2023 00:25      Assessment/Plan 33 yo female with acute uncomplicated appendicitis. I reviewed the details of laparoscopic appendectomy, including the benefits and the risks of bleeding and infection. We also reviewed the postoperative restrictions and anticipated recovery course. She expressed understanding and agrees to proceed with surgery. Received Zosyn overnight in ED. Periop ceftriaxone and flagyl are to be given.  Anticipate discharge home from PACU. All questions were answered.   Sophronia Simas, MD [facility] General, Hepatobiliary and Pancreatic Surgery 04/02/23 11:04 AM

## 2023-04-02 NOTE — Discharge Instructions (Signed)

## 2023-04-02 NOTE — ED Notes (Signed)
 Per Greggory Stallion from [facility], patient is now scheduled for OR at 09:00, Sophronia Simas, MD accepting.

## 2023-04-03 ENCOUNTER — Encounter (HOSPITAL_COMMUNITY): Payer: Self-pay | Admitting: Surgery

## 2023-04-03 NOTE — Anesthesia Postprocedure Evaluation (Signed)
 Anesthesia Post Note  Patient: Doctor, hospital  Procedure(s) Performed: APPENDECTOMY, LAPAROSCOPIC (Abdomen)     Patient location during evaluation: PACU Anesthesia Type: General Level of consciousness: awake and alert Pain management: pain level controlled Vital Signs Assessment: post-procedure vital signs reviewed and stable Respiratory status: spontaneous breathing, nonlabored ventilation, respiratory function stable and patient connected to nasal cannula oxygen Cardiovascular status: blood pressure returned to baseline and stable Postop Assessment: no apparent nausea or vomiting Anesthetic complications: no   No notable events documented.  Last Vitals:  Vitals:   04/02/23 1234 04/02/23 1330  BP:  111/82  Pulse:  68  Resp:  12  Temp: (!) 36.4 C 36.6 C  SpO2:  96%    Last Pain:  Vitals:   04/02/23 1315  TempSrc:   PainSc: 6                  Chaniqua Brisby S

## 2023-04-07 LAB — SURGICAL PATHOLOGY

## 2023-04-07 NOTE — Addendum Note (Signed)
 Addendum  created 04/07/23 0723 by Achille Rich, MD   Attestation recorded in Intraprocedure, Intraprocedure Attestations filed

## 2023-10-21 ENCOUNTER — Encounter (HOSPITAL_BASED_OUTPATIENT_CLINIC_OR_DEPARTMENT_OTHER): Payer: Self-pay | Admitting: Emergency Medicine

## 2023-10-21 ENCOUNTER — Emergency Department (HOSPITAL_BASED_OUTPATIENT_CLINIC_OR_DEPARTMENT_OTHER)
Admission: EM | Admit: 2023-10-21 | Discharge: 2023-10-21 | Disposition: A | Discharge status: Home / Self Care | Attending: Emergency Medicine | Admitting: Emergency Medicine

## 2023-10-21 ENCOUNTER — Other Ambulatory Visit: Payer: Self-pay

## 2023-10-21 DIAGNOSIS — R739 Hyperglycemia, unspecified: Secondary | ICD-10-CM

## 2023-10-21 DIAGNOSIS — Z794 Long term (current) use of insulin: Secondary | ICD-10-CM | POA: Insufficient documentation

## 2023-10-21 DIAGNOSIS — E875 Hyperkalemia: Secondary | ICD-10-CM | POA: Diagnosis not present

## 2023-10-21 DIAGNOSIS — E1165 Type 2 diabetes mellitus with hyperglycemia: Secondary | ICD-10-CM | POA: Insufficient documentation

## 2023-10-21 LAB — URINALYSIS, ROUTINE W REFLEX MICROSCOPIC
Bilirubin Urine: NEGATIVE
Glucose, UA: >=500 mg/dL — AB
Hgb urine dipstick: NEGATIVE
Ketones, ur: 80 mg/dL — AB
Leukocytes,Ua: NEGATIVE
Nitrite: NEGATIVE
Protein, ur: NEGATIVE mg/dL
Specific Gravity, Urine: 1.01 (ref 1.005–1.030)
pH: 5.5 (ref 5.0–8.0)

## 2023-10-21 LAB — BASIC METABOLIC PANEL WITH GFR
Anion gap: 16 — ABNORMAL HIGH (ref 5–15)
BUN: 11 mg/dL (ref 6–20)
CO2: 22 mmol/L (ref 22–32)
Calcium: 9.7 mg/dL (ref 8.9–10.3)
Chloride: 94 mmol/L — ABNORMAL LOW (ref 98–111)
Creatinine, Ser: 0.85 mg/dL (ref 0.44–1.00)
GFR, Estimated: >60 mL/min (ref >60)
Glucose, Bld: 469 mg/dL — ABNORMAL HIGH (ref 70–99)
Potassium: 5.2 mmol/L — ABNORMAL HIGH (ref 3.5–5.1)
Sodium: 132 mmol/L — ABNORMAL LOW (ref 135–145)

## 2023-10-21 LAB — PREGNANCY, URINE: Preg Test, Ur: NEGATIVE

## 2023-10-21 LAB — HEMOGLOBIN A1C
Hgb A1c MFr Bld: 13.1 % — ABNORMAL HIGH (ref 4.8–5.6)
Mean Plasma Glucose: 329.27 mg/dL

## 2023-10-21 LAB — URINALYSIS, MICROSCOPIC (REFLEX)

## 2023-10-21 LAB — CBC
HCT: 41.8 % (ref 36.0–46.0)
Hemoglobin: 14.4 g/dL (ref 12.0–15.0)
MCH: 30.3 pg (ref 26.0–34.0)
MCHC: 34.4 g/dL (ref 30.0–36.0)
MCV: 87.8 fL (ref 80.0–100.0)
Platelets: 238 K/uL (ref 150–400)
RBC: 4.76 MIL/uL (ref 3.87–5.11)
RDW: 12.6 % (ref 11.5–15.5)
WBC: 5.1 K/uL (ref 4.0–10.5)
nRBC: 0 % (ref 0.0–0.2)

## 2023-10-21 LAB — CBG MONITORING, ED
Glucose-Capillary: 443 mg/dL — ABNORMAL HIGH (ref 70–99)
Glucose-Capillary: 255 mg/dL — ABNORMAL HIGH (ref 70–99)

## 2023-10-21 MED ORDER — INSULIN ASPART 100 UNIT/ML IJ SOLN
8.0000 [IU] | Freq: Once | INTRAMUSCULAR | Status: AC
  Administered 2023-10-21: 8 [IU] via SUBCUTANEOUS

## 2023-10-21 MED ORDER — METFORMIN HCL 500 MG PO TABS: 500.0000 mg | ORAL_TABLET | Freq: Two times a day (BID) | ORAL | 0 refills | Status: AC

## 2023-10-21 MED ORDER — INSULIN REGULAR HUMAN 100 UNIT/ML IJ SOLN: 8.0000 [IU] | Freq: Once | INTRAMUSCULAR | Status: DC

## 2023-10-21 MED ORDER — LACTATED RINGERS IV BOLUS
1000.0000 mL | Freq: Once | INTRAVENOUS | Status: AC
  Administered 2023-10-21: 1000 mL via INTRAVENOUS

## 2023-10-21 NOTE — ED Provider Notes (Signed)
 [place] [facility] AT [facility] Provider Note   CSN: [id number] Arrival date & time: 10/21/23  1345     Patient presents with: Hyperglycemia   Lisa Brown is a 33 y.o. female.   Patient with history of diabetes, was told in the past that this was gestational, blood sugar in the 200s during admission in March 2025 for appendicitis, currently not on medication for diabetes --presents to the emergency department for evaluation of hyperglycemia.  Patient states that she has been having some intermittent blurry vision and tingling in her fingers.  She states that she checked her blood sugar today and it was elevated in the high 400s.  This is what prompted emergency department visit.  Patient denies abdominal pain or vomiting.  She has had increased thirst, some weight loss.  She does not feel that she is urinating more than usual to me (triage note reports increased frequency).  Patient denies any recent medical illness including respiratory symptoms, cough, abdominal pain, diarrhea.  Patient states that she has seen a [facility] in the past but recently changed insurance and has a new recommended physician that she has not seen yet.       Prior to Admission medications   Medication Sig Start Date End Date Taking? Authorizing Provider  HYDROcodone -acetaminophen  (NORCO/VICODIN) 5-325 MG tablet Take 1 tablet by mouth every 6 (six) hours as needed for moderate pain (pain score 4-6). 04/02/23   Vicci Burnard SAUNDERS, PA-C  hydrocortisone  (ANUSOL -HC) 2.5 % rectal cream Place rectally 2 (two) times daily. 09/26/21   Anyanwu, Ugonna A, MD  hydrOXYzine  (ATARAX ) 10 MG tablet Take 1 tablet (10 mg total) by mouth 3 (three) times daily as needed. 08/12/21   Cresenzo, John V, MD  ibuprofen  (ADVIL ) 600 MG tablet Take 1 tablet (600 mg total) by mouth every 6 (six) hours as needed. 01/06/23   Dasie Faden, MD  Prenatal Vit-Fe Fumarate-FA (M-NATAL PLUS) 27-1 MG TABS TAKE 1 TABLET BY  MOUTH EVERY DAY 02/12/22   Rudy Carlin LABOR, MD    Allergies: Patient has no known allergies.    Review of Systems  Updated Vital Signs BP 117/85 (BP Location: Left Arm)   Pulse 97   Temp 98.2 F (36.8 C) (Oral)   Resp 17   Ht 5' 11 (1.803 m)   Wt 97.5 kg   SpO2 99%   Breastfeeding No   BMI 29.99 kg/m   Physical Exam Vitals and nursing note reviewed.  Constitutional:      General: She is not in acute distress.    Appearance: She is well-developed.     Comments: Tearful  HENT:     Head: Normocephalic and atraumatic.     Right Ear: External ear normal.     Left Ear: External ear normal.     Nose: Nose normal.     Mouth/Throat:     Mouth: Mucous membranes are moist.  Eyes:     Conjunctiva/sclera: Conjunctivae normal.  Cardiovascular:     Rate and Rhythm: Normal rate and regular rhythm.     Heart sounds: No murmur heard. Pulmonary:     Effort: No respiratory distress.     Breath sounds: No wheezing, rhonchi or rales.  Abdominal:     Palpations: Abdomen is soft.     Tenderness: There is no abdominal tenderness. There is no guarding or rebound.  Musculoskeletal:     Cervical back: Normal range of motion and neck supple.     Right  lower leg: No edema.     Left lower leg: No edema.  Skin:    General: Skin is warm and dry.     Findings: No rash.  Neurological:     General: No focal deficit present.     Mental Status: She is alert. Mental status is at baseline.     Motor: No weakness.  Psychiatric:        Mood and Affect: Mood normal.     (all labs ordered are listed, but only abnormal results are displayed) Labs Reviewed  BASIC METABOLIC PANEL WITH GFR - Abnormal; Notable for the following components:      Result Value   Sodium 132 (*)    Potassium 5.2 (*)    Chloride 94 (*)    Glucose, Bld 469 (*)    Anion gap 16 (*)    All other components within normal limits  URINALYSIS, ROUTINE W REFLEX MICROSCOPIC - Abnormal; Notable for the following components:    Glucose, UA >=500 (*)    Ketones, ur 80 (*)    All other components within normal limits  URINALYSIS, MICROSCOPIC (REFLEX) - Abnormal; Notable for the following components:   Bacteria, UA RARE (*)    All other components within normal limits  CBG MONITORING, ED - Abnormal; Notable for the following components:   Glucose-Capillary 443 (*)    All other components within normal limits  CBG MONITORING, ED - Abnormal; Notable for the following components:   Glucose-Capillary 255 (*)    All other components within normal limits  CBC  PREGNANCY, URINE  HEMOGLOBIN A1C  CBG MONITORING, ED  CBG MONITORING, ED    EKG: None  Radiology: No results found.   Procedures   Medications Ordered in the ED  lactated ringers  bolus 1,000 mL (0 mLs Intravenous Stopped 10/21/23 1644)  insulin  aspart (novoLOG ) injection 8 Units (8 Units Subcutaneous Given 10/21/23 1539)   ED Course  Patient seen and examined. History obtained directly from patient. Work-up including labs, imaging, EKG ordered in triage, if performed, were reviewed.    Labs/EKG: Independently reviewed and interpreted.  This included: CBC with normal white blood cell count and hemoglobin; BMP with sodium 132 corrects to normal when accounting for hyperglycemia, potassium 5.2 with normal creatinine and BUN, glucose 469, anion gap 16 with a normal bicarb at 22.  Imaging: None ordered  Medications/Fluids: Ordered: LR bolus, subcutaneous insulin  8 units  Most recent vital signs reviewed and are as follows: BP 117/85 (BP Location: Left Arm)   Pulse 97   Temp 98.2 F (36.8 C) (Oral)   Resp 17   Ht 5' 11 (1.803 m)   Wt 97.5 kg   SpO2 99%   Breastfeeding No   BMI 29.99 kg/m   Initial impression: Hyperglycemia without ketosis, discussed starting metformin  with patient until she can follow-up with his PCP to discuss other options.  She seems reluctant to start this medication, but I stressed the importance of blood sugar control  until she can see primary care.  I added on hemoglobin A1c for outpatient purposes.  Do not suspect DKA at this time.  5:56 PM Reassessment performed. Patient appears stable.  She looks well.  Blood sugar improved.  Labs personally reviewed and interpreted including: UA without signs of infection, there are 80 ketones, but again low clinical concern for DKA.  Blood sugar improved 443 >> 255.  Reviewed pertinent lab work and imaging with patient at bedside. Questions answered.   Most current vital  signs reviewed and are as follows: BP 106/71 (BP Location: Right Arm)   Pulse 76   Temp 98.1 F (36.7 C) (Oral)   Resp 18   Ht 5' 11 (1.803 m)   Wt 97.5 kg   SpO2 99%   Breastfeeding No   BMI 29.99 kg/m   Plan: Discharge to home.   Prescriptions written for: Metformin , discussed home titration of metformin  due to GI side effects.  Patient has had sensitivity to metformin  in the past.  Other home care instructions discussed: Continue good hydration  ED return instructions discussed: Return with vomiting, abdominal pain, new or worsening symptoms.  Follow-up instructions discussed: Patient encouraged to follow-up with their PCP in 7 days.                                   Medical Decision Making Amount and/or Complexity of Data Reviewed Labs: ordered.  Risk OTC drugs. Prescription drug management.   Patient with history of gestational diabetes presents with elevated blood sugar today.  This likely represents type 2 diabetes.  Patient does not have symptoms which would be consistent with DKA today.  She has minimally elevated anion gap at 16 with normal bicarbonate.  Glucose improved with IM insulin  and IV fluids.  Potassium was slightly high, treated with insulin .  Normal renal function and I do not suspect significant effect from hyperkalemia today.  Patient was hydrated with improvement.  She will be started on metformin .  She states that her family is helping her arrange an  outpatient follow-up appointment.  Strongly encouraged this.  The patient's vital signs, pertinent lab work and imaging were reviewed and interpreted as discussed in the ED course. Hospitalization was considered for further testing, treatments, or serial exams/observation. However as patient is well-appearing, has a stable exam, and reassuring studies today, I do not feel that they warrant admission at this time. This plan was discussed with the patient who verbalizes agreement and comfort with this plan and seems reliable and able to return to the Emergency Department with worsening or changing symptoms.       Final diagnoses:  Hyperglycemia    ED Discharge Orders          Ordered    metFORMIN  (GLUCOPHAGE ) 500 MG tablet  2 times daily with meals        10/21/23 1753               Desiderio Chew, PA-C 10/21/23 1759    Yolande Lamar BROCKS, MD 10/26/23 1251

## 2023-10-21 NOTE — ED Triage Notes (Signed)
 Pt reports elevated blood sugar today, 478.    Denies nausea, excessive thirst, increased urinary frequency.

## 2023-10-21 NOTE — Discharge Instructions (Addendum)
 Please read and follow all provided instructions.  Your diagnoses today include:  1. Hyperglycemia     Tests performed today include: Complete blood cell count: Normal white blood cell count and red blood cell count Basic metabolic panel: Your blood sugar was high in the 400s, your sodium and and chloride were low but this is related to dehydration from your high blood sugar Urinalysis (urine test): No sign of infection Pregnancy test (urine or blood, in women only): Negative Hemoglobin A1c: This is a blood test that will be helpful when you go see primary care Vital signs. See below for your results today.   Medications prescribed:  Metformin : This is a medication for elevated blood sugar that is best taken with food.  Take it twice a day.  If it causes GI upset or significant diarrhea that is too much, decrease to once a day.  If still having significant side effects you may take half a tablet once a day and then slowly increase the dose back up after you are doing well for several days.  Typically symptoms will get better as your body adjusts to this medication.  Take any prescribed medications only as directed.  Home care instructions:  Follow any educational materials contained in this packet.  BE VERY CAREFUL not to take multiple medicines containing Tylenol  (also called acetaminophen ). Doing so can lead to an overdose which can damage your liver and cause liver failure and possibly death.   Follow-up instructions: Please follow-up with your primary care provider in the next 7 days for further evaluation of your symptoms.   Return instructions:  Please return to the Emergency Department if you experience worsening symptoms.  Please return if you have any other emergent concerns.  Additional Information:  Your vital signs today were: BP 106/71 (BP Location: Right Arm)   Pulse 76   Temp 98.1 F (36.7 C) (Oral)   Resp 18   Ht 5' 11 (1.803 m)   Wt 97.5 kg   SpO2 99%    Breastfeeding No   BMI 29.99 kg/m  If your blood pressure (BP) was elevated above 135/85 this visit, please have this repeated by your doctor within one month. --------------

## 2023-10-22 ENCOUNTER — Ambulatory Visit (HOSPITAL_COMMUNITY): Payer: Self-pay
